# Patient Record
Sex: Male | Born: 1960 | Race: White | Hispanic: No | Smoking: Former smoker
Health system: Southern US, Community
[De-identification: ages and names within clinical notes are randomized; demographics above are authoritative.]

## PROBLEM LIST (undated history)

## (undated) DIAGNOSIS — J449 Chronic obstructive pulmonary disease, unspecified: Secondary | ICD-10-CM

## (undated) DIAGNOSIS — I10 Essential (primary) hypertension: Secondary | ICD-10-CM

## (undated) DIAGNOSIS — T7840XA Allergy, unspecified, initial encounter: Secondary | ICD-10-CM

## (undated) DIAGNOSIS — Z972 Presence of dental prosthetic device (complete) (partial): Secondary | ICD-10-CM

## (undated) DIAGNOSIS — F17290 Nicotine dependence, other tobacco product, uncomplicated: Secondary | ICD-10-CM

## (undated) DIAGNOSIS — M1990 Unspecified osteoarthritis, unspecified site: Secondary | ICD-10-CM

## (undated) DIAGNOSIS — G4733 Obstructive sleep apnea (adult) (pediatric): Secondary | ICD-10-CM

## (undated) HISTORY — DX: Presence of dental prosthetic device (complete) (partial): Z97.2

## (undated) HISTORY — DX: Obstructive sleep apnea (adult) (pediatric): G47.33

## (undated) HISTORY — DX: Unspecified osteoarthritis, unspecified site: M19.90

## (undated) HISTORY — DX: Nicotine dependence, other tobacco product, uncomplicated: F17.290

## (undated) HISTORY — DX: Allergy, unspecified, initial encounter: T78.40XA

## (undated) HISTORY — PX: DENTAL SURGERY: SHX609

---

## 2002-05-05 DIAGNOSIS — G4733 Obstructive sleep apnea (adult) (pediatric): Secondary | ICD-10-CM

## 2002-05-05 HISTORY — DX: Obstructive sleep apnea (adult) (pediatric): G47.33

## 2010-04-25 LAB — COMPREHENSIVE METABOLIC PANEL
ALT: 23 U/L (ref 10–40)
CO2: 27 mmol/L
Calcium: 9 mg/dL
Chloride: 100 mmol/L
Creat: 0.91

## 2010-12-16 LAB — URINALYSIS
Bilirubin, UA: NEGATIVE
Blood, UA: NEGATIVE
Glucose: NEGATIVE
Ketones, UA: NEGATIVE
Leukocytes, UA: NEGATIVE
pH: 6.5

## 2010-12-16 LAB — COMPREHENSIVE METABOLIC PANEL
ALT: 20 U/L (ref 10–40)
AST: 18 U/L
Alkaline Phosphatase: 56 U/L
Creat: 0.78
Total Bilirubin: 0.5 mg/dL

## 2011-08-08 ENCOUNTER — Encounter (HOSPITAL_COMMUNITY): Payer: Self-pay | Admitting: *Deleted

## 2011-08-08 ENCOUNTER — Emergency Department (HOSPITAL_COMMUNITY)
Admission: EM | Admit: 2011-08-08 | Discharge: 2011-08-08 | Disposition: A | Discharge status: Home / Self Care | Payer: Self-pay | Attending: Emergency Medicine | Admitting: Emergency Medicine

## 2011-08-08 DIAGNOSIS — L0291 Cutaneous abscess, unspecified: Secondary | ICD-10-CM

## 2011-08-08 DIAGNOSIS — IMO0002 Reserved for concepts with insufficient information to code with codable children: Secondary | ICD-10-CM | POA: Insufficient documentation

## 2011-08-08 DIAGNOSIS — I10 Essential (primary) hypertension: Secondary | ICD-10-CM | POA: Insufficient documentation

## 2011-08-08 DIAGNOSIS — L02818 Cutaneous abscess of other sites: Secondary | ICD-10-CM | POA: Insufficient documentation

## 2011-08-08 HISTORY — DX: Essential (primary) hypertension: I10

## 2011-08-08 LAB — POCT I-STAT, CHEM 8
BUN: 12 mg/dL (ref 6–23)
Creatinine, Ser: 0.9 mg/dL (ref 0.50–1.35)
Hemoglobin: 15.6 g/dL (ref 13.0–17.0)
Potassium: 3.8 mEq/L (ref 3.5–5.1)
Sodium: 138 mEq/L (ref 135–145)

## 2011-08-08 LAB — GLUCOSE, CAPILLARY: Glucose-Capillary: 113 mg/dL — ABNORMAL HIGH (ref 70–99)

## 2011-08-08 MED ORDER — SULFAMETHOXAZOLE-TRIMETHOPRIM 800-160 MG PO TABS: 1.0000 | ORAL_TABLET | Freq: Two times a day (BID) | ORAL | Status: AC

## 2011-08-08 NOTE — Discharge Instructions (Signed)

## 2011-08-08 NOTE — ED Notes (Signed)
I&D kit at bedside. 

## 2011-08-08 NOTE — ED Provider Notes (Signed)
History     CSN: [id number]  Arrival date & time 08/08/11  1633   First MD Initiated Contact with Patient 08/08/11 1905      Chief Complaint  Patient presents with  . Abscess    (Consider location/radiation/quality/duration/timing/severity/associated sxs/prior treatment) Patient is a 51 y.o. male presenting with abscess. The history is provided by the patient.  Abscess  This is a new problem. Pertinent negatives include no fever. There were no sick contacts.   the patient reports he has no history of abscesses to the skin. Last week, he noticed an abscess to his left forearm that became red and swollen. He opened it to drain and it has improved. Several days later, he noticed a similar lesion more distally on his left forearm with a similar course. He also notes that in the last 3 days, he has developed a third lesion to his occiput that he has not drained. He denies any fever or chills. There is minimal associated pain.   Past Medical History  Diagnosis Date  . Hypertension     History reviewed. No pertinent past surgical history.  History reviewed. No pertinent family history.  History  Substance Use Topics  . Smoking status: Not on file  . Smokeless tobacco: Not on file  . Alcohol Use:       Review of Systems  Constitutional: Negative for fever and chills.  HENT: Negative for neck pain and neck stiffness.   Skin: Positive for wound.  Neurological: Negative for weakness, numbness and headaches.    Allergies  Review of patient's allergies indicates no known allergies.  Home Medications   Current Outpatient Rx  Name Route Sig Dispense Refill  . ALPRAZOLAM 0.5 MG PO TABS Oral Take 0.5 mg by mouth at bedtime as needed. For sleep    . AMLODIPINE BESYLATE 10 MG PO TABS Oral Take 10 mg by mouth daily.    . ASPIRIN 325 MG PO TABS Oral Take 650 mg by mouth every 6 (six) hours as needed. For pain    . HYDROCHLOROTHIAZIDE 25 MG PO TABS Oral Take 25 mg by mouth daily.      . NEOMYCIN-POLYMYXIN-PRAMOXINE 1 % EX CREA Topical Apply 1 application topically 2 (two) times daily. To elbow    . PHENYLEPHRINE-DM-GG 2.5-5-100 MG/5ML PO LIQD Oral Take 15 mLs by mouth every 6 (six) hours as needed. For chest congestion      BP 133/85  Pulse 71  Temp(Src) 97.9 F (36.6 C) (Oral)  Resp 20  SpO2 100%  Physical Exam  Nursing note and vitals reviewed. Constitutional: He is oriented to person, place, and time. He appears well-developed and well-nourished. No distress.  HENT:  Head: Normocephalic and atraumatic.    Eyes: Conjunctivae are normal. Pupils are equal, round, and reactive to light.  Neck: Normal range of motion. Neck supple.  Cardiovascular: Normal rate and regular rhythm.   Pulmonary/Chest: Effort normal. No respiratory distress.  Abdominal: Soft. He exhibits no distension. There is no tenderness.  Musculoskeletal: He exhibits no edema and no tenderness.  Neurological: He is alert and oriented to person, place, and time. No cranial nerve deficit.  Skin: Skin is warm and dry.       2 healing abscesses to the posterior left forearm. More proximal location with a 2 mm central scab with no surrounding erythema or induration, though there is some peeling of the skin. More distal abscess with 5 mm scab to its center with small amount of surrounding erythema but  no induration. No area of fluctuance to either area. Please see HENT exam for documentation of the third abscess.    ED Course  Procedures (including critical care time)  Labs Reviewed  GLUCOSE, CAPILLARY - Abnormal; Notable for the following:    Glucose-Capillary 113 (*)    All other components within normal limits  POCT I-STAT, CHEM 8 - Abnormal; Notable for the following:    Glucose, Bld 107 (*)    All other components within normal limits   No results found. INCISION AND DRAINAGE Performed by: Lorenz Coaster Consent: Verbal consent obtained. Risks and benefits: risks, benefits and  alternatives were discussed Type: abscess  Body area: occiput  Anesthesia: local infiltration  Local anesthetic: lidocaine 2% with epinephrine  Anesthetic total: 1 ml  Complexity: complex Blunt dissection to break up loculations  Drainage: purulent  Drainage amount: small  Packing material: No packing placed due to small size of abscess  Patient tolerance: Patient tolerated the procedure well with no immediate complications.     Dx 1: Abscess of multiple sites   MDM  Abscess to scalp drained with small amount of purulent drainage. Remaining 2 abscess is already drained and are not consistent in appearance with requiring additional drainage at this time. Given multiple abscesses in the patient with no history, he will be treated with antibiotics. Although pt reports a history of "kidney problem" approx 7 years ago secondary to untreated HTN, he has been taking his HTN medications as directed and renal function today is well within normal limits. We will start him on bactrim and he is to see his primary doctor at a previously scheduled visit on Monday morning for a recheck and further recommendations.        Shaaron Adler, PA-C 08/08/11 2054  Shaaron Adler, PA-C 08/08/11 2055

## 2011-08-08 NOTE — ED Notes (Signed)
Pt in c/o multiple abscesses to left arm since last week, three bumps at this time

## 2011-08-09 NOTE — ED Provider Notes (Signed)
Medical screening examination/treatment/procedure(s) were performed by non-physician practitioner and as supervising physician I was immediately available for consultation/collaboration.  Eriel Doyon, MD 08/09/11 0006 

## 2011-12-16 LAB — CBC WITH DIFFERENTIAL/PLATELET
Basophils Absolute: 0 /uL
Basophils Relative: 1 % (ref 0–3)
Eosinophils Absolute: 0 /uL
Eosinophils Relative: 2 % (ref 0–6)
HCT: 45 %
MCHC: 34.5
MCV: 89.9 fL (ref 80–98)
Monocytes(Absolute): 0.5
RDW: 13.5
platelet count: 200

## 2011-12-25 LAB — COMPREHENSIVE METABOLIC PANEL
ALT: 25 U/L (ref 10–40)
Albumin: 4.5
CO2: 32 mmol/L
Glucose: 85
Potassium: 4.7 mmol/L
Sodium: 138 mmol/L (ref 137–147)
Total Bilirubin: 0.5 mg/dL
Total Protein: 7.7 g/dL

## 2011-12-25 LAB — CBC WITH DIFFERENTIAL/PLATELET
Basophils Absolute: 0 /uL
Eosinophil percent: 1
Grans (Absolute): 7.7
Granulocyte percent: 70 %G (ref 37–80)
LYMPH%: 22 %
Lymphs(Absolute): 2.4
MCV: 88.8 fL (ref 80–98)
RBC: 4.99
RDW: 13.7
WBC: 8
platelet count: 204

## 2011-12-25 LAB — URINALYSIS
Blood, UA: NEGATIVE
Glucose: NEGATIVE
Urobilinogen, UA: NORMAL

## 2013-02-11 ENCOUNTER — Encounter: Payer: Self-pay | Admitting: Nurse Practitioner

## 2013-02-11 ENCOUNTER — Ambulatory Visit (INDEPENDENT_AMBULATORY_CARE_PROVIDER_SITE_OTHER): Payer: 59 | Admitting: Nurse Practitioner

## 2013-02-11 VITALS — BP 150/98 | HR 69 | Temp 97.7°F | Ht 70.5 in | Wt 218.0 lb

## 2013-02-11 DIAGNOSIS — Z87898 Personal history of other specified conditions: Secondary | ICD-10-CM

## 2013-02-11 DIAGNOSIS — Z0000 Encounter for general adult medical examination without abnormal findings: Secondary | ICD-10-CM

## 2013-02-11 DIAGNOSIS — Z23 Encounter for immunization: Secondary | ICD-10-CM

## 2013-02-11 DIAGNOSIS — F1021 Alcohol dependence, in remission: Secondary | ICD-10-CM

## 2013-02-11 DIAGNOSIS — Z716 Tobacco abuse counseling: Secondary | ICD-10-CM | POA: Insufficient documentation

## 2013-02-11 DIAGNOSIS — G4700 Insomnia, unspecified: Secondary | ICD-10-CM

## 2013-02-11 DIAGNOSIS — F17200 Nicotine dependence, unspecified, uncomplicated: Secondary | ICD-10-CM

## 2013-02-11 DIAGNOSIS — I10 Essential (primary) hypertension: Secondary | ICD-10-CM | POA: Insufficient documentation

## 2013-02-11 LAB — TSH: TSH: 1.1 u[IU]/mL (ref 0.35–5.50)

## 2013-02-11 LAB — LIPID PANEL
HDL: 41.7 mg/dL (ref >39.00)
Triglycerides: 152 mg/dL — ABNORMAL HIGH (ref 0.0–149.0)
VLDL: 30.4 mg/dL (ref 0.0–40.0)

## 2013-02-11 LAB — HEPATIC FUNCTION PANEL
ALT: 23 U/L (ref 0–53)
Alkaline Phosphatase: 58 U/L (ref 39–117)
Bilirubin, Direct: 0.2 mg/dL (ref 0.0–0.3)
Total Bilirubin: 1 mg/dL (ref 0.3–1.2)
Total Protein: 7.1 g/dL (ref 6.0–8.3)

## 2013-02-11 LAB — RENAL FUNCTION PANEL
CO2: 30 mEq/L (ref 19–32)
Calcium: 9.1 mg/dL (ref 8.4–10.5)
Chloride: 99 mEq/L (ref 96–112)
Creatinine, Ser: 1 mg/dL (ref 0.4–1.5)
GFR: 89.18 mL/min (ref >60.00)
Sodium: 137 mEq/L (ref 135–145)

## 2013-02-11 LAB — CBC
Hemoglobin: 14.7 g/dL (ref 13.0–17.0)
MCHC: 34.8 g/dL (ref 30.0–36.0)
RDW: 13.6 % (ref 11.5–14.6)
WBC: 7.4 10*3/uL (ref 4.5–10.5)

## 2013-02-11 MED ORDER — LORAZEPAM 0.5 MG PO TABS: 0.5000 mg | ORAL_TABLET | Freq: Two times a day (BID) | ORAL | Status: DC | PRN

## 2013-02-11 MED ORDER — ALBUTEROL SULFATE HFA 108 (90 BASE) MCG/ACT IN AERS: 2.0000 | INHALATION_SPRAY | Freq: Four times a day (QID) | RESPIRATORY_TRACT | Status: DC | PRN

## 2013-02-11 MED ORDER — LISINOPRIL-HYDROCHLOROTHIAZIDE 10-12.5 MG PO TABS: 1.0000 | ORAL_TABLET | Freq: Every day | ORAL | Status: DC

## 2013-02-11 NOTE — Progress Notes (Signed)
Subjective:     Todd Mercado is a 52 y.o. male and is here to establish care. He has a history of treated HTN, but ran out of meds 1 mo. Ago; smoker, and excessive alcohol consumption for which he is motivated to stop. He had a historical PCP. The patient reports L foot numbness associated with prolonged sitting and occasional R arm numbness.Marland Kitchen  History   Social History  . Marital Status: Single    Spouse Name: N/A    Number of Children: 3  . Years of Education: HS   Occupational History  .      sign service tech   Social History Main Topics  . Smoking status: Heavy Tobacco Smoker -- 1.50 packs/day for 30 years    Types: Cigarettes  . Smokeless tobacco: Not on file     Comment: discussed benefits of quitting  . Alcohol Use: Yes     Comment: 6-12 beers daily  . Drug Use: Not on file  . Sexual Activity: Not on file   Other Topics Concern  . Not on file   Social History Narrative   Todd Mercado is divorced and is currently living with his mother. He has 3 children.   No health maintenance topics applied.  The following portions of the patient's history were reviewed and updated as appropriate: allergies, current medications, past family history, past medical history, past social history, past surgical history and problem list.  Review of Systems Constitutional: negative for anorexia, chills, fatigue, fevers, night sweats and weight loss Eyes: positive for contacts/glasses and last eye exam March, 2014 Ears, nose, mouth, throat, and face: negative Respiratory: positive for cough, sputum, wheezing and smoker Cardiovascular: negative for chest pain, chest pressure/discomfort, irregular heart beat and lower extremity edema Gastrointestinal: negative for abdominal pain, change in bowel habits, constipation, diarrhea, dyspepsia and nausea Genitourinary:negative for decreased stream Integument/breast: negative for rash Musculoskeletal:positive for L foot numbness when  sitting Neurological: positive for paresthesia, negative for coordination problems, gait problems, headaches, memory problems, seizures, tremors and weakness Behavioral/Psych: positive for excessive alcohol consumption, sleep disturbance, tobacco use and Hx os depression/anxiety when father died, treated w/wellbutrin-did not like SE., negative for anxiety, depression, fatigue and illegal drug usage   Objective:    BP 150/98  Pulse 69  Temp(Src) 97.7 F (36.5 C) (Oral)  Ht 5' 10.5" (1.791 m)  Wt 218 lb (98.884 kg)  BMI 30.83 kg/m2  SpO2 95% General appearance: alert, cooperative, appears stated age and no distress Head: Normocephalic, without obvious abnormality, atraumatic Eyes: negative findings: lids and lashes normal, conjunctivae and sclerae normal, corneas clear and pupils equal, round, reactive to light and accomodation Ears: normal TM's and external ear canals both ears Throat: posterior pharynx erythematous, uvula swollen. no c/o sore throat. Pt is smoker. has frequent cough. Neck: no adenopathy, no carotid bruit, supple, symmetrical, trachea midline and thyroid not enlarged, symmetric, no tenderness/mass/nodules Lungs: diminished breath sounds bibasilar, wheezes RUL and productive cough. some wheezes clear w/coughing Heart: regular rate and rhythm, S1, S2 normal, no murmur, click, rub or gallop Abdomen: soft, non-tender; bowel sounds normal; no masses,  no organomegaly and round, mildly protruding Extremities: extremities normal, atraumatic, no cyanosis or edema Pulses: 2+ and symmetric Lymph nodes: Cervical, supraclavicular, and axillary nodes normal. Neurologic: Alert and oriented X 3, normal strength and tone. Normal symmetric reflexes. Normal coordination and gait    Assessment:    Healthy male exam. Prev care: needs flu, pneumococcal, and Tdap, ifob. Opted not to check PSA.  Screening labs as ordered. HTN: historical Dx treated w/amlodipine & HCTZ, ran out of meds 1 mo.  Ago, elevated in ofc today. Smoker, motivated to quit, in contemplative stage. Productive cough, wheezing, likely COPD Excessive alcohol consumption: pt reports having recently quit drinking in last 2 weeks. He has experienced irritability, no other withdrawal symptoms L foot numbness assoc w/prolonged sitting, likely lumbar radiculopathy     Plan:    1. Flu & Pna vaccine administered today. Will get tdap at next visit. 2 Restart lisinopril& HCTZ at 10 mg/12.5mg , check renal func, f/u in 3-4 weeks 3 educated regarding benefits of cessation, encouraged use of nicotene gum or e-cigg. Albuterol inhaler, f/u 3-4 weeks. Offered LDCT chest for lung ca screening. Pt declined. 4 PRN ativan 5 back stretches, pt thinks it is his shoe, planning to get new work boots this weekend. Will order back xrays ot send to PT if no improvement.   See After Visit Summary for Counseling Recommendations

## 2013-02-11 NOTE — Patient Instructions (Signed)
Our office will call you with lab results. Nice to meet you.  Preventive Care for Adults, Male A healthy lifestyle and preventive care can promote health and wellness. Preventive health guidelines for men include the following key practices:  A routine yearly physical is a good way to check with your caregiver about your health and preventative screening. It is a chance to share any concerns and updates on your health, and to receive a thorough exam.  Visit your dentist for a routine exam and preventative care every 6 months. Brush your teeth twice a day and floss once a day. Good oral hygiene prevents tooth decay and gum disease.  The frequency of eye exams is based on your age, health, family medical history, use of contact lenses, and other factors. Follow your caregiver's recommendations for frequency of eye exams.  Eat a healthy diet. Foods like vegetables, fruits, whole grains, low-fat dairy products, and lean protein foods contain the nutrients you need without too many calories. Decrease your intake of foods high in solid fats, added sugars, and salt. Eat the right amount of calories for you.Get information about a proper diet from your caregiver, if necessary.  Regular physical exercise is one of the most important things you can do for your health. Most adults should get at least 150 minutes of moderate-intensity exercise (any activity that increases your heart rate and causes you to sweat) each week. In addition, most adults need muscle-strengthening exercises on 2 or more days a week.  Maintain a healthy weight. The body mass index (BMI) is a screening tool to identify possible weight problems. It provides an estimate of body fat based on height and weight. Your caregiver can help determine your BMI, and can help you achieve or maintain a healthy weight.For adults 20 years and older:  A BMI below 18.5 is considered underweight.  A BMI of 18.5 to 24.9 is normal.  A BMI of 25 to 29.9  is considered overweight.  A BMI of 30 and above is considered obese.  Maintain normal blood lipids and cholesterol levels by exercising and minimizing your intake of saturated fat. Eat a balanced diet with plenty of fruit and vegetables. Blood tests for lipids and cholesterol should begin at age 88 and be repeated every 5 years. If your lipid or cholesterol levels are high, you are over 50, or you are a high risk for heart disease, you may need your cholesterol levels checked more frequently.Ongoing high lipid and cholesterol levels should be treated with medicines if diet and exercise are not effective.  If you smoke, find out from your caregiver how to quit. If you do not use tobacco, do not start.  If you choose to drink alcohol, do not exceed 2 drinks per day. One drink is considered to be 12 ounces (355 mL) of beer, 5 ounces (148 mL) of wine, or 1.5 ounces (44 mL) of liquor.  Avoid use of street drugs. Do not share needles with anyone. Ask for help if you need support or instructions about stopping the use of drugs.  High blood pressure causes heart disease and increases the risk of stroke. Your blood pressure should be checked at least every 1 to 2 years. Ongoing high blood pressure should be treated with medicines, if weight loss and exercise are not effective.  If you are 32 to 50 years old, ask your caregiver if you should take aspirin to prevent heart disease.  Diabetes screening involves taking a blood sample to check  your fasting blood sugar level. This should be done once every 3 years, after age 75, if you are within normal weight and without risk factors for diabetes. Testing should be considered at a younger age or be carried out more frequently if you are overweight and have at least 1 risk factor for diabetes.  Colorectal cancer can be detected and often prevented. Most routine colorectal cancer screening begins at the age of 81 and continues through age 13. However, your  caregiver may recommend screening at an earlier age if you have risk factors for colon cancer. On a yearly basis, your caregiver may provide home test kits to check for hidden blood in the stool. Use of a small camera at the end of a tube, to directly examine the colon (sigmoidoscopy or colonoscopy), can detect the earliest forms of colorectal cancer. Talk to your caregiver about this at age 59, when routine screening begins. Direct examination of the colon should be repeated every 5 to 10 years through age 50, unless early forms of pre-cancerous polyps or small growths are found.  Hepatitis C blood testing is recommended for all people born from [birth year] through 1965 and any individual with known risks for hepatitis C.  Practice safe sex. Use condoms and avoid high-risk sexual practices to reduce the spread of sexually transmitted infections (STIs). STIs include gonorrhea, chlamydia, syphilis, trichomonas, herpes, HPV, and human immunodeficiency virus (HIV). Herpes, HIV, and HPV are viral illnesses that have no cure. They can result in disability, cancer, and death.  A one-time screening for abdominal aortic aneurysm (AAA) and surgical repair of large AAAs by sound wave imaging (ultrasonography) is recommended for ages 36 to 17 years who are current or former smokers.  Healthy men should no longer receive prostate-specific antigen (PSA) blood tests as part of routine cancer screening. Consult with your caregiver about prostate cancer screening.  Testicular cancer screening is not recommended for adult males who have no symptoms. Screening includes self-exam, caregiver exam, and other screening tests. Consult with your caregiver about any symptoms you have or any concerns you have about testicular cancer.  Use sunscreen with skin protection factor (SPF) of 30 or more. Apply sunscreen liberally and repeatedly throughout the day. You should seek shade when your shadow is shorter than you. Protect yourself by  wearing long sleeves, pants, a wide-brimmed hat, and sunglasses year round, whenever you are outdoors.  Once a month, do a whole body skin exam, using a mirror to look at the skin on your back. Notify your caregiver of new moles, moles that have irregular borders, moles that are larger than a pencil eraser, or moles that have changed in shape or color.  Stay current with required immunizations.  Influenza. You need a dose every fall (or winter). The composition of the flu vaccine changes each year, so being vaccinated once is not enough.  Pneumococcal polysaccharide. You need 1 to 2 doses if you smoke cigarettes or if you have certain chronic medical conditions. You need 1 dose at age 25 (or older) if you have never been vaccinated.  Tetanus, diphtheria, pertussis (Tdap, Td). Get 1 dose of Tdap vaccine if you are younger than age 75 years, are over 58 and have contact with an infant, are a Research scientist (physical sciences), or simply want to be protected from whooping cough. After that, you need a Td booster dose every 10 years. Consult your caregiver if you have not had at least 3 tetanus and diphtheria-containing shots sometime in your life  or have a deep or dirty wound.  HPV. This vaccine is recommended for males 13 through 50 years of age. This vaccine may be given to men 22 through 49 years of age who have not completed the 3 dose series. It is recommended for men through age 40 who have sex with men or whose immune system is weakened because of HIV infection, other illness, or medications. The vaccine is given in 3 doses over 6 months.  Measles, mumps, rubella (MMR). You need at least 1 dose of MMR if you were born in 1957 or later. You may also need a 2nd dose.  Meningococcal. If you are age 25 to 65 years and a Orthoptist living in a residence hall, or have one of several medical conditions, you need to get vaccinated against meningococcal disease. You may also need additional booster  doses.  Zoster (shingles). If you are age 15 years or older, you should get this vaccine.  Varicella (chickenpox). If you have never had chickenpox or you were vaccinated but received only 1 dose, talk to your caregiver to find out if you need this vaccine.  Hepatitis A. You need this vaccine if you have a specific risk factor for hepatitis A virus infection, or you simply wish to be protected from this disease. The vaccine is usually given as 2 doses, 6 to 18 months apart.  Hepatitis B. You need this vaccine if you have a specific risk factor for hepatitis B virus infection or you simply wish to be protected from this disease. The vaccine is given in 3 doses, usually over 6 months. Preventative Service / Frequency Ages 68 to 55  Blood pressure check.** / Every 1 to 2 years.  Lipid and cholesterol check.** / Every 5 years beginning at age 102.  Hepatitis C blood test.** / For any individual with known risks for hepatitis C.  Skin self-exam. / Monthly.  Influenza immunization.** / Every year.  Pneumococcal polysaccharide immunization.** / 1 to 2 doses if you smoke cigarettes or if you have certain chronic medical conditions.  Tetanus, diphtheria, pertussis (Tdap,Td) immunization. / A one-time dose of Tdap vaccine. After that, you need a Td booster dose every 10 years.  HPV immunization. / 3 doses over 6 months, if 26 and younger.  Measles, mumps, rubella (MMR) immunization. / You need at least 1 dose of MMR if you were born in 1957 or later. You may also need a 2nd dose.  Meningococcal immunization. / 1 dose if you are age 23 to 85 years and a Orthoptist living in a residence hall, or have one of several medical conditions, you need to get vaccinated against meningococcal disease. You may also need additional booster doses.  Varicella immunization.** / Consult your caregiver.  Hepatitis A immunization.** / Consult your caregiver. 2 doses, 6 to 18 months  apart.  Hepatitis B immunization.** / Consult your caregiver. 3 doses usually over 6 months. Ages 32 to 29  Blood pressure check.** / Every 1 to 2 years.  Lipid and cholesterol check.** / Every 5 years beginning at age 65.  Fecal occult blood test (FOBT) of stool. / Every year beginning at age 83 and continuing until age 38. You may not have to do this test if you get colonoscopy every 10 years.  Flexible sigmoidoscopy** or colonoscopy.** / Every 5 years for a flexible sigmoidoscopy or every 10 years for a colonoscopy beginning at age 37 and continuing until age 10.  Hepatitis C  blood test.** / For all people born from [birth year] through 1965 and any individual with known risks for hepatitis C.  Skin self-exam. / Monthly.  Influenza immunization.** / Every year.  Pneumococcal polysaccharide immunization.** / 1 to 2 doses if you smoke cigarettes or if you have certain chronic medical conditions.  Tetanus, diphtheria, pertussis (Tdap/Td) immunization.** / A one-time dose of Tdap vaccine. After that, you need a Td booster dose every 10 years.  Measles, mumps, rubella (MMR) immunization. / You need at least 1 dose of MMR if you were born in 1957 or later. You may also need a 2nd dose.  Varicella immunization.**/ Consult your caregiver.  Meningococcal immunization.** / Consult your caregiver.  Hepatitis A immunization.** / Consult your caregiver. 2 doses, 6 to 18 months apart.  Hepatitis B immunization.** / Consult your caregiver. 3 doses, usually over 6 months. Ages 43 and over  Blood pressure check.** / Every 1 to 2 years.  Lipid and cholesterol check.**/ Every 5 years beginning at age 15.  Fecal occult blood test (FOBT) of stool. / Every year beginning at age 32 and continuing until age 81. You may not have to do this test if you get colonoscopy every 10 years.  Flexible sigmoidoscopy** or colonoscopy.** / Every 5 years for a flexible sigmoidoscopy or every 10 years for a colonoscopy  beginning at age 64 and continuing until age 71.  Hepatitis C blood test.** / For all people born from [birth year] through 1963 and any individual with known risks for hepatitis C.  Abdominal aortic aneurysm (AAA) screening.** / A one-time screening for ages 58 to 26 years who are current or former smokers.  Skin self-exam. / Monthly.  Influenza immunization.** / Every year.  Pneumococcal polysaccharide immunization.** / 1 dose at age 28 (or older) if you have never been vaccinated.  Tetanus, diphtheria, pertussis (Tdap, Td) immunization. / A one-time dose of Tdap vaccine if you are over 65 and have contact with an infant, are a Research scientist (physical sciences), or simply want to be protected from whooping cough. After that, you need a Td booster dose every 10 years.  Varicella immunization. ** / Consult your caregiver.  Meningococcal immunization.** / Consult your caregiver.  Hepatitis A immunization. ** / Consult your caregiver. 2 doses, 6 to 18 months apart.  Hepatitis B immunization.** / Check with your caregiver. 3 doses, usually over 6 months. **Family history and personal history of risk and conditions may change your caregiver's recommendations. Document Released: 06/17/2001 Document Revised: 07/14/2011 Document Reviewed: 09/16/2010 [facility] Patient Information 2014 [place], [place].

## 2013-02-12 LAB — HIV ANTIBODY (ROUTINE TESTING W REFLEX): HIV: NONREACTIVE

## 2013-02-12 LAB — URINALYSIS, MICROSCOPIC ONLY
Bacteria, UA: NONE SEEN
Casts: NONE SEEN

## 2013-02-12 LAB — VITAMIN D 25 HYDROXY (VIT D DEFICIENCY, FRACTURES): Vit D, 25-Hydroxy: 36 ng/mL (ref 30–89)

## 2013-02-14 ENCOUNTER — Encounter: Payer: Self-pay | Admitting: Nurse Practitioner

## 2013-02-14 ENCOUNTER — Telehealth: Payer: Self-pay | Admitting: Nurse Practitioner

## 2013-02-14 NOTE — Telephone Encounter (Signed)
Patient's mom was notified of results. Mom stated that patient will call back for f/u appt.

## 2013-02-14 NOTE — Telephone Encounter (Signed)
Kidney & liver func nml, no thyroid disease, no diabetes. HIV non-reactv.  Still waiting on B12 (methylmalonic acid) Vit D low, rec 5000 iu capsule daily. ifob not returned yet.

## 2013-02-15 ENCOUNTER — Telehealth: Payer: Self-pay | Admitting: Nurse Practitioner

## 2013-02-15 LAB — METHYLMALONIC ACID, SERUM: Methylmalonic Acid, Quant: 0.3 umol/L (ref <0.40)

## 2013-02-15 NOTE — Telephone Encounter (Signed)
Methylmalonic acid is nml-no B12 deficiency.

## 2013-02-15 NOTE — Telephone Encounter (Signed)
LMOVM for patient to return call concerning lab results.  

## 2013-02-21 ENCOUNTER — Other Ambulatory Visit (INDEPENDENT_AMBULATORY_CARE_PROVIDER_SITE_OTHER): Payer: 59

## 2013-02-21 DIAGNOSIS — Z0000 Encounter for general adult medical examination without abnormal findings: Secondary | ICD-10-CM

## 2013-02-22 ENCOUNTER — Telehealth: Payer: Self-pay | Admitting: Nurse Practitioner

## 2013-02-22 LAB — FECAL OCCULT BLOOD, IMMUNOCHEMICAL: Fecal Occult Bld: NEGATIVE

## 2013-02-22 NOTE — Telephone Encounter (Signed)
Fecal occult blood test neg. F/u 1 yr.

## 2013-02-23 NOTE — Telephone Encounter (Signed)
Left detailed message on patient's home vm. Per DPR. 

## 2013-02-23 NOTE — Telephone Encounter (Signed)
Left detailed message on patient's home vm. Per DPR.

## 2013-03-11 ENCOUNTER — Encounter: Payer: Self-pay | Admitting: Nurse Practitioner

## 2013-03-11 ENCOUNTER — Ambulatory Visit (INDEPENDENT_AMBULATORY_CARE_PROVIDER_SITE_OTHER): Payer: 59 | Admitting: Nurse Practitioner

## 2013-03-11 VITALS — BP 148/90 | Wt 214.8 lb

## 2013-03-11 DIAGNOSIS — I10 Essential (primary) hypertension: Secondary | ICD-10-CM

## 2013-03-11 DIAGNOSIS — F1011 Alcohol abuse, in remission: Secondary | ICD-10-CM

## 2013-03-11 DIAGNOSIS — F17200 Nicotine dependence, unspecified, uncomplicated: Secondary | ICD-10-CM

## 2013-03-11 HISTORY — DX: Alcohol abuse, in remission: F10.11

## 2013-03-11 MED ORDER — ALBUTEROL SULFATE HFA 108 (90 BASE) MCG/ACT IN AERS: 2.0000 | INHALATION_SPRAY | Freq: Four times a day (QID) | RESPIRATORY_TRACT | Status: DC | PRN

## 2013-03-11 MED ORDER — LISINOPRIL-HYDROCHLOROTHIAZIDE 10-12.5 MG PO TABS: 1.0000 | ORAL_TABLET | Freq: Every day | ORAL | Status: DC

## 2013-03-11 NOTE — Progress Notes (Signed)
Subjective:    Patient here for follow-up of elevated blood pressure.  He is exercising and is not adherent to a low-salt diet. Cardiac symptoms: none. Patient denies: chest pain, chest pressure/discomfort, dyspnea, irregular heart beat and lower extremity edema. Cardiovascular risk factors: hypertension, male gender and smoking/ tobacco exposure. Use of agents associated with hypertension: decongestants. History of target organ damage: none. He has cut out soda & has increased exercise-lost 4 pounds.  He mentions a syncopal episode that occurred 3 weeks ago, 5 d after starting bp med, while at work. Event was associated with smashing his finger, standing, & passing out for 2 seconds 9"long enough to fall & get back up"). Episode was witnessed by co-workers. Pt was examined by RN-pt states vitals were nml. Pt states it is not the first time he has passed out due to seeing blood. He denies nausea, diaphoresis, palpitations, or SOB associated with event.  Pt c/o nocturia-gets up 3 times every night.  Common ambulatory SmartLinks:[id number]}  Review of Systems Constitutional: positive for intentional weight loss Ears, nose, mouth, throat, and face: positive for nasal congestion Respiratory: positive for cough, sputum and smoker, feels better using albuterol twice daily. Cardiovascular: negative for chest pain, chest pressure/discomfort, fatigue, irregular heart beat and lower extremity edema Genitourinary:positive for nocturia Musculoskeletal:positive for fatty lump that "pops up" R shoulder , less foot & leg pain after changing boots. Has back pain w/prolonged sitting. Neurological: transient foot & hand numbness Behavioral/Psych: positive for excessive alcohol consumption, negative for illegal drug usage and sleep disturbance     Objective:    BP 148/90  Wt 214 lb 12.8 oz (97.433 kg) General appearance: alert, cooperative, appears older than stated age and no distress Head: Normocephalic, without  obvious abnormality, atraumatic Eyes: negative findings: lids and lashes normal, conjunctivae and sclerae normal and corneas clear Ears: bilateral ear effusions Throat: abnormal findings: posterior pharynx erythema Neck: no adenopathy, supple, symmetrical, trachea midline and thyroid not enlarged, symmetric, no tenderness/mass/nodules Lungs: wheezes LUL Heart: regular rate and rhythm, S1, S2 normal, no murmur, click, rub or gallop and distant heart tones    Assessment:    Hypertension, stage 1 taking lisinopril/Hctz 10/12.5. Lost 4 pounds, taking OTC cold meds today. Evidence of target organ damage: none.   Syncopal episode, per pt hx, seems to be r/t injuring finger & seeing blood. Denies cardiac symptoms. Smoker, not motivated to quit. Using albuterol inhaler bid. Viral URI-nasal congestion, cough no worse than usual, no increase in sputum.  Plan:  Low salt diet, continue exercise & cut out sugar. Continue meds at current dose. F/u 6 mos. Offered ECG, pt refused . Discussed lower risk for heart dz if he stops smoking: 10 yr risk for ASCVD is 8%, if stops smoking- 3.65%. Offered statin. Pt refused Daily sinus rinses.

## 2013-03-11 NOTE — Patient Instructions (Addendum)
Great job with weight loss! Continue to cut out sugar & keep moving: 30 minutes of exercise daily has been proven to decrease risk for heart disease, diabetes, stroke, dementia, cancer, and depression. Limit salt to 2400 mg daily to help manage blood pressure. For nasal congestion, start daily sinus rinses (Neilmed sinus Rinse). Use for 5-6 days. Take 200-600 mg ibuprophen every 6-8 hours for a few days for sinus headache.  See you in 6 months or sooner if you need [place].  Smoking Cessation Quitting smoking is important to your health and has many advantages. However, it is not always easy to quit since nicotine is a very addictive drug. Often times, people try 3 times or more before being able to quit. This document explains the best ways for you to prepare to quit smoking. Quitting takes hard work and a lot of effort, but you can do it. ADVANTAGES OF QUITTING SMOKING  You will live longer, feel better, and live better.  Your body will feel the impact of quitting smoking almost immediately.  Within 20 minutes, blood pressure decreases. Your pulse returns to its normal level.  After 8 hours, carbon monoxide levels in the blood return to normal. Your oxygen level increases.  After 24 hours, the chance of having a heart attack starts to decrease. Your breath, hair, and body stop smelling like smoke.  After 48 hours, damaged nerve endings begin to recover. Your sense of taste and smell improve.  After 72 hours, the body is virtually free of nicotine. Your bronchial tubes relax and breathing becomes easier.  After 2 to 12 weeks, lungs can hold more air. Exercise becomes easier and circulation improves.  The risk of having a heart attack, stroke, cancer, or lung disease is greatly reduced.  After 1 year, the risk of coronary heart disease is cut in half.  After 5 years, the risk of stroke falls to the same as a nonsmoker.  After 10 years, the risk of lung cancer is cut in half and the risk of  other cancers decreases significantly.  After 15 years, the risk of coronary heart disease drops, usually to the level of a nonsmoker.  If you are pregnant, quitting smoking will improve your chances of having a healthy baby.  The people you live with, especially any children, will be healthier.  You will have extra money to spend on things other than cigarettes. QUESTIONS TO THINK ABOUT BEFORE ATTEMPTING TO QUIT You may want to talk about your answers with your caregiver.  Why do you want to quit?  If you tried to quit in the past, what helped and what did not?  What will be the most difficult situations for you after you quit? How will you plan to handle them?  Who can help you through the tough times? Your family? Friends? A caregiver?  What pleasures do you get from smoking? What ways can you still get pleasure if you quit? Here are some questions to ask your caregiver:  How can you help me to be successful at quitting?  What medicine do you think would be best for me and how should I take it?  What should I do if I need more help?  What is smoking withdrawal like? How can I get information on withdrawal? GET READY  Set a quit date.  Change your environment by getting rid of all cigarettes, ashtrays, matches, and lighters in your home, car, or work. Do not let people smoke in your home.  Review your past attempts to quit. Think about what worked and what did not. GET SUPPORT AND ENCOURAGEMENT You have a better chance of being successful if you have help. You can get support in many ways.  Tell your family, friends, and co-workers that you are going to quit and need their support. Ask them not to smoke around you.  Get individual, group, or telephone counseling and support. Programs are available at [employer] and health centers. Call your local health department for information about programs in your area.  Spiritual beliefs and practices may help some smokers  quit.  Download a "quit meter" on your computer to keep track of quit statistics, such as how long you have gone without smoking, cigarettes not smoked, and money saved.  Get a self-help book about quitting smoking and staying off of tobacco. LEARN NEW SKILLS AND BEHAVIORS  Distract yourself from urges to smoke. Talk to someone, go for a walk, or occupy your time with a task.  Change your normal routine. Take a different route to work. Drink tea instead of coffee. Eat breakfast in a different place.  Reduce your stress. Take a hot bath, exercise, or read a book.  Plan something enjoyable to do every day. Reward yourself for not smoking.  Explore interactive web-based programs that specialize in helping you quit. GET MEDICINE AND USE IT CORRECTLY Medicines can help you stop smoking and decrease the urge to smoke. Combining medicine with the above behavioral methods and support can greatly increase your chances of successfully quitting smoking.  Nicotine replacement therapy helps deliver nicotine to your body without the negative effects and risks of smoking. Nicotine replacement therapy includes nicotine gum, lozenges, inhalers, nasal sprays, and skin patches. Some may be available over-the-counter and others require a prescription.  Antidepressant medicine helps people abstain from smoking, but how this works is unknown. This medicine is available by prescription.  Nicotinic receptor partial agonist medicine simulates the effect of nicotine in your brain. This medicine is available by prescription. Ask your caregiver for advice about which medicines to use and how to use them based on your health history. Your caregiver will tell you what side effects to look out for if you choose to be on a medicine or therapy. Carefully read the information on the package. Do not use any other product containing nicotine while using a nicotine replacement product.  RELAPSE OR DIFFICULT SITUATIONS Most  relapses occur within the first 3 months after quitting. Do not be discouraged if you start smoking again. Remember, most people try several times before finally quitting. You may have symptoms of withdrawal because your body is used to nicotine. You may crave cigarettes, be irritable, feel very hungry, cough often, get headaches, or have difficulty concentrating. The withdrawal symptoms are only temporary. They are strongest when you first quit, but they will go away within 10 14 days. To reduce the chances of relapse, try to:  Avoid drinking alcohol. Drinking lowers your chances of successfully quitting.  Reduce the amount of caffeine you consume. Once you quit smoking, the amount of caffeine in your body increases and can give you symptoms, such as a rapid heartbeat, sweating, and anxiety.  Avoid smokers because they can make you want to smoke.  Do not let weight gain distract you. Many smokers will gain weight when they quit, usually less than 10 pounds. Eat a healthy diet and stay active. You can always lose the weight gained after you quit.  Find ways to  improve your mood other than smoking. FOR MORE INFORMATION  www.smokefree.gov  Document Released: 04/15/2001 Document Revised: 10/21/2011 Document Reviewed: 07/31/2011 [facility] Patient Information 2014 [place], [place].

## 2013-03-23 ENCOUNTER — Encounter: Payer: Self-pay | Admitting: Family Medicine

## 2013-04-03 ENCOUNTER — Encounter: Payer: Self-pay | Admitting: Nurse Practitioner

## 2013-04-03 DIAGNOSIS — Z8669 Personal history of other diseases of the nervous system and sense organs: Secondary | ICD-10-CM | POA: Insufficient documentation

## 2013-04-03 DIAGNOSIS — M79672 Pain in left foot: Secondary | ICD-10-CM

## 2013-04-03 DIAGNOSIS — M79671 Pain in right foot: Secondary | ICD-10-CM | POA: Insufficient documentation

## 2013-04-05 ENCOUNTER — Encounter: Payer: Self-pay | Admitting: *Deleted

## 2013-04-05 ENCOUNTER — Telehealth: Payer: Self-pay | Admitting: *Deleted

## 2013-04-05 NOTE — Telephone Encounter (Signed)
Error

## 2013-04-08 ENCOUNTER — Other Ambulatory Visit: Payer: Self-pay | Admitting: *Deleted

## 2013-04-08 DIAGNOSIS — I10 Essential (primary) hypertension: Secondary | ICD-10-CM

## 2013-04-12 ENCOUNTER — Other Ambulatory Visit: Payer: Self-pay | Admitting: *Deleted

## 2013-04-12 DIAGNOSIS — I10 Essential (primary) hypertension: Secondary | ICD-10-CM

## 2013-12-30 ENCOUNTER — Ambulatory Visit (INDEPENDENT_AMBULATORY_CARE_PROVIDER_SITE_OTHER): Payer: 59 | Admitting: Nurse Practitioner

## 2013-12-30 VITALS — BP 169/93 | HR 69 | Temp 97.9°F | Resp 18 | Ht 70.5 in | Wt 212.0 lb

## 2013-12-30 DIAGNOSIS — IMO0001 Reserved for inherently not codable concepts without codable children: Secondary | ICD-10-CM

## 2013-12-30 DIAGNOSIS — R030 Elevated blood-pressure reading, without diagnosis of hypertension: Secondary | ICD-10-CM

## 2013-12-30 MED ORDER — LISINOPRIL 20 MG PO TABS: 20.0000 mg | ORAL_TABLET | Freq: Every day | ORAL | Status: DC

## 2013-12-30 NOTE — Progress Notes (Signed)
Subjective:    Patient here for follow-up of elevated blood pressure.  He is not exercising and is not adherent to a low-salt diet.  Blood pressure is not well controlled at home. Cardiac symptoms: none. Patient denies: chest pain, chest pressure/discomfort, fatigue, irregular heart beat, lower extremity edema and palpitations. Cardiovascular risk factors: hypertension, male gender, sedentary lifestyle and smoking/ tobacco exposure. Use of agents associated with hypertension: none. History of target organ damage: none.  He was last seen in ofc 9 mos ago for HTN. He changed jobs & lost insurance & quit taking medication about 3 mos ago. He reports normal home blood pressures until a few weeks ago.    The following portions of the patient's history were reviewed and updated as appropriate: allergies, current medications, past family history, past medical history, past social history, past surgical history and problem list.  Review of Systems Pertinent items are noted in HPI.     Objective:    BP 169/93  Pulse 69  Temp(Src) 97.9 F (36.6 C) (Oral)  Resp 18  Ht 5' 10.5" (1.791 m)  Wt 212 lb (96.163 kg)  BMI 29.98 kg/m2  SpO2 96% General appearance: alert, cooperative, appears stated age and no distress Head: Normocephalic, without obvious abnormality, atraumatic Eyes: negative findings: lids and lashes normal and conjunctivae and sclerae normal Lungs: clear to auscultation bilaterally Heart: regular rate and rhythm, S1, S2 normal, no murmur, click, rub or gallop Extremities: extremities normal, atraumatic, no cyanosis or edema    Assessment:  1. Elevated blood pressure - lisinopril (PRINIVIL,ZESTRIL) 20 MG tablet; Take 1 tablet (20 mg total) by mouth daily.  Dispense: 30 tablet; Refill: 1  No labs today as no ins, but advised pt need to check kidney & liver function within next few mos.  F/u in 1 mo.

## 2013-12-30 NOTE — Progress Notes (Signed)
Pre visit review using our clinic review tool, if applicable. No additional management support is needed unless otherwise documented below in the visit note. 

## 2013-12-30 NOTE — Patient Instructions (Signed)
Please start blood pressure medicine.  Take blood pressure at home. It should stay below 140/80 to avoid damage to kidneys, heart, & eyes. When you check pressure, feet should be flat on floor, cuff at level of heart, and sit for 5 minutes before pushing button.  Cut back on food portions and take a 30 minute walk every day-you'll feel better and may live longer.  Please return in 3 to 4 weeks so I can see how you are responding.  Nice to see you.

## 2014-02-27 ENCOUNTER — Other Ambulatory Visit: Payer: Self-pay | Admitting: *Deleted

## 2014-02-27 DIAGNOSIS — R030 Elevated blood-pressure reading, without diagnosis of hypertension: Principal | ICD-10-CM

## 2014-02-27 DIAGNOSIS — IMO0001 Reserved for inherently not codable concepts without codable children: Secondary | ICD-10-CM

## 2014-02-27 MED ORDER — LISINOPRIL 20 MG PO TABS: 20.0000 mg | ORAL_TABLET | Freq: Every day | ORAL | Status: DC

## 2014-05-01 ENCOUNTER — Other Ambulatory Visit: Payer: Self-pay | Admitting: *Deleted

## 2014-05-01 DIAGNOSIS — IMO0001 Reserved for inherently not codable concepts without codable children: Secondary | ICD-10-CM

## 2014-05-01 DIAGNOSIS — R030 Elevated blood-pressure reading, without diagnosis of hypertension: Principal | ICD-10-CM

## 2014-05-01 MED ORDER — LISINOPRIL 20 MG PO TABS: 20.0000 mg | ORAL_TABLET | Freq: Every day | ORAL | Status: DC

## 2014-06-06 ENCOUNTER — Other Ambulatory Visit: Payer: Self-pay | Admitting: *Deleted

## 2014-06-06 DIAGNOSIS — R030 Elevated blood-pressure reading, without diagnosis of hypertension: Principal | ICD-10-CM

## 2014-06-06 DIAGNOSIS — IMO0001 Reserved for inherently not codable concepts without codable children: Secondary | ICD-10-CM

## 2014-06-06 MED ORDER — LISINOPRIL 20 MG PO TABS: 20.0000 mg | ORAL_TABLET | Freq: Every day | ORAL | Status: DC

## 2014-07-07 ENCOUNTER — Other Ambulatory Visit: Payer: Self-pay

## 2014-07-07 DIAGNOSIS — R030 Elevated blood-pressure reading, without diagnosis of hypertension: Principal | ICD-10-CM

## 2014-07-07 DIAGNOSIS — IMO0001 Reserved for inherently not codable concepts without codable children: Secondary | ICD-10-CM

## 2014-07-07 MED ORDER — LISINOPRIL 20 MG PO TABS: 20.0000 mg | ORAL_TABLET | Freq: Every day | ORAL | Status: DC

## 2014-07-07 NOTE — Telephone Encounter (Signed)
I told patient that we needed to schedule a F/U before refilling his medicine. [place]. I talked with Gardiner RamusMichelle Alyscia Carmon,CMA about refilling his rx per his circumstance she said that it was fine to do. I told patient that we needed to schedule an appointment and she said he wanted to wait to schedule it due to his schedule. I told him that if he did not call us before his month was up to make appointment that we would not be able refill his medicine due to not being monitored. He understood and will CB at the beginning of next week to schedule appointment.

## 2014-08-01 ENCOUNTER — Ambulatory Visit (INDEPENDENT_AMBULATORY_CARE_PROVIDER_SITE_OTHER): Payer: Self-pay | Admitting: Family Medicine

## 2014-08-01 ENCOUNTER — Telehealth: Payer: Self-pay | Admitting: Nurse Practitioner

## 2014-08-01 ENCOUNTER — Encounter: Payer: Self-pay | Admitting: Family Medicine

## 2014-08-01 VITALS — BP 126/85 | HR 59 | Temp 97.8°F | Ht 70.5 in | Wt 212.0 lb

## 2014-08-01 DIAGNOSIS — J018 Other acute sinusitis: Secondary | ICD-10-CM

## 2014-08-01 DIAGNOSIS — J209 Acute bronchitis, unspecified: Secondary | ICD-10-CM

## 2014-08-01 DIAGNOSIS — F17200 Nicotine dependence, unspecified, uncomplicated: Secondary | ICD-10-CM

## 2014-08-01 DIAGNOSIS — Z720 Tobacco use: Secondary | ICD-10-CM

## 2014-08-01 DIAGNOSIS — J42 Unspecified chronic bronchitis: Secondary | ICD-10-CM

## 2014-08-01 MED ORDER — ALBUTEROL SULFATE HFA 108 (90 BASE) MCG/ACT IN AERS
2.0000 | INHALATION_SPRAY | Freq: Four times a day (QID) | RESPIRATORY_TRACT | Status: DC | PRN
Start: 2014-08-01 — End: 2015-06-14

## 2014-08-01 MED ORDER — PREDNISONE 20 MG PO TABS: ORAL_TABLET | ORAL | Status: DC

## 2014-08-01 MED ORDER — AZITHROMYCIN 250 MG PO TABS: ORAL_TABLET | ORAL | Status: DC

## 2014-08-01 MED ORDER — HYDROCODONE-HOMATROPINE 5-1.5 MG/5ML PO SYRP: ORAL_SOLUTION | ORAL | Status: DC

## 2014-08-01 NOTE — Progress Notes (Signed)
Pre visit review using our clinic review tool, if applicable. No additional management support is needed unless otherwise documented below in the visit note. 

## 2014-08-01 NOTE — Progress Notes (Signed)
OFFICE NOTE  08/01/2014  CC:  Chief Complaint  Patient presents with  . Allergies    been fighting for about 6 weeks     HPI: Patient is a 54 y.o. Caucasian male who is here for respiratory symptoms. Onset about 6 wks ago of nasal congestion, sneezing, sore throat.  He has a chronic smoker's cough. No fevers.  Some malaise at onset but this has resolved.  He feels like he has bad PND and coughs up mucous was initially clear/runny and lately has been thick and green. Has tried OTC allergy med that works sometimes/sometimes not.  Robitussin and mucinex DM not helpful. Says coughing is much worse than his baseline, feels a little SOB but denies chest tightness or wheezing.  He is a current smoker, hx of 2 packs per day x decades.  Has been rx'd albuterol inhaler in the past but he says it did not help when rx'd in the past.   Pertinent PMH:  PMH and PSH reviewed.  MEDS: (He has been on his ACE-I for about 10 yrs) Outpatient Prescriptions Prior to Visit  Medication Sig Dispense Refill  . aspirin 325 MG tablet Take 650 mg by mouth every 6 (six) hours as needed. For pain    . lisinopril-hydrochlorothiazide (PRINZIDE,ZESTORETIC) 10-12.5 MG per tablet Take 1 tablet by mouth daily. 90 tablet 1  . albuterol (PROVENTIL HFA;VENTOLIN HFA) 108 (90 BASE) MCG/ACT inhaler Inhale 2 puffs into the lungs every 6 (six) hours as needed for wheezing. (Patient not taking: Reported on 08/01/2014) 1 Inhaler 5  . lisinopril (PRINIVIL,ZESTRIL) 20 MG tablet Take 1 tablet (20 mg total) by mouth daily. (Patient not taking: Reported on 08/01/2014) 30 tablet 0  . LORazepam (ATIVAN) 0.5 MG tablet Take 1 tablet (0.5 mg total) by mouth 2 (two) times daily as needed for anxiety. (Patient not taking: Reported on 08/01/2014) 30 tablet 0   No facility-administered medications prior to visit.    PE: Blood pressure 126/85, pulse 59, temperature 97.8 F (36.6 C), temperature source Temporal, height 5' 10.5" (1.791 m),  weight 212 lb (96.163 kg), SpO2 95 %. Gen: Alert, well appearing.  Patient is oriented to person, place, time, and situation. He is coughing nearly constantly.  No resp distress. HEENT: eyes without injection, drainage, or swelling.  Ears: EACs clear, TMs with normal light reflex and landmarks.  Nose: Clear rhinorrhea, with some dried, crusty exudate adherent to mildly injected mucosa.  No purulent d/c.  No paranasal sinus TTP.  No facial swelling.  Throat and mouth without focal lesion.  No pharyngial swelling, erythema, or exudate.   Neck: supple, no LAD.   LUNGS: bilat diffuse exp wheezing, no insp crackles but occ insp rhonchorus sound, nonlabored resps.   Lots of post-exhalation coughing. CV: RRR, no m/r/g. EXT: no c/c/e SKIN: no rash   IMPRESSION AND PLAN:  Prolonged URI/possible bacterial sinusitis, with acute exacerbation of chronic bronchitis. Ongoing tobacco abuse.  Alb/atr neb in office today: improved aeration, pt able to take deep breaths much easier and had MUCH less coughing. Prednisone taper 40 qd x 5, then 20 qd x 5, then 10 qd x 4.  Azithromycin x 5d. Robitussin DM OTC prn.  Hycodan 1-2 tsp qhs prn, #[id number]. Refilled albuterol HFA and CMA did inhaler education today. Signs/symptoms to call or return for were reviewed and pt expressed understanding.  An After Visit Summary was printed and given to the patient.  FOLLOW UP: prn

## 2014-08-01 NOTE — Telephone Encounter (Signed)
emmi mailed  °

## 2014-08-07 ENCOUNTER — Ambulatory Visit: Payer: Self-pay | Admitting: Nurse Practitioner

## 2014-08-07 ENCOUNTER — Other Ambulatory Visit: Payer: Self-pay

## 2014-08-07 ENCOUNTER — Telehealth: Payer: Self-pay | Admitting: Nurse Practitioner

## 2014-08-07 DIAGNOSIS — I10 Essential (primary) hypertension: Secondary | ICD-10-CM

## 2014-08-07 MED ORDER — LISINOPRIL-HYDROCHLOROTHIAZIDE 10-12.5 MG PO TABS: 1.0000 | ORAL_TABLET | Freq: Every day | ORAL | Status: DC

## 2014-08-07 NOTE — Telephone Encounter (Signed)
Please Advise Refill Request? Refill request for- Lisinopril Last filled by MD on - 03/11/13 Last Appt - 08/01/14        Next Appt - 08/21/14 Pharmacy- [facility]

## 2014-08-07 NOTE — Telephone Encounter (Signed)
pls call pt: Advise i sent 30 days of bp med.  He must make appt. To f/u & will need labs, does not need to fast.

## 2014-08-07 NOTE — Telephone Encounter (Signed)
LMOVM to have patient to call back to remind him of his appt.   Patient has a f/u appt already scheduled for 08/21/14 at 8am.

## 2014-08-08 NOTE — Telephone Encounter (Signed)
Called patient again, no answer

## 2014-08-21 ENCOUNTER — Ambulatory Visit: Payer: Self-pay | Admitting: Nurse Practitioner

## 2014-10-09 ENCOUNTER — Other Ambulatory Visit: Payer: Self-pay | Admitting: Family Medicine

## 2014-10-09 DIAGNOSIS — I10 Essential (primary) hypertension: Secondary | ICD-10-CM

## 2014-10-09 MED ORDER — LISINOPRIL-HYDROCHLOROTHIAZIDE 10-12.5 MG PO TABS: 1.0000 | ORAL_TABLET | Freq: Every day | ORAL | Status: DC

## 2014-10-09 NOTE — Telephone Encounter (Signed)
I will send in 30 days only. Pls schedule OV to review HTN.

## 2014-10-09 NOTE — Telephone Encounter (Signed)
Last OV with Layne 12/30/13.  Did have a sick visit 07/31/13.  Please advise.

## 2014-11-03 ENCOUNTER — Other Ambulatory Visit: Payer: Self-pay | Admitting: Family Medicine

## 2014-11-03 DIAGNOSIS — I10 Essential (primary) hypertension: Secondary | ICD-10-CM

## 2014-11-03 NOTE — Telephone Encounter (Signed)
Refuse script. He needs OV. This is repeat of his request in April.

## 2014-11-03 NOTE — Telephone Encounter (Signed)
Spoke to pt's mom.  Okay per DPR.  Pt is out of town for work.   I told her that he needs to be seen for further refills.  She states she will get in touch with pt and see when he will be able to schedule.

## 2014-12-11 ENCOUNTER — Other Ambulatory Visit: Payer: Self-pay | Admitting: Family Medicine

## 2014-12-11 DIAGNOSIS — I10 Essential (primary) hypertension: Secondary | ICD-10-CM

## 2015-05-28 ENCOUNTER — Encounter: Payer: Self-pay | Admitting: Emergency Medicine

## 2015-05-28 ENCOUNTER — Emergency Department (INDEPENDENT_AMBULATORY_CARE_PROVIDER_SITE_OTHER)
Admission: EM | Admit: 2015-05-28 | Discharge: 2015-05-28 | Disposition: A | Discharge status: Home / Self Care | Payer: Self-pay | Source: Home / Self Care | Attending: Family Medicine | Admitting: Family Medicine

## 2015-05-28 DIAGNOSIS — M25561 Pain in right knee: Secondary | ICD-10-CM

## 2015-05-28 DIAGNOSIS — M25461 Effusion, right knee: Secondary | ICD-10-CM

## 2015-05-28 DIAGNOSIS — F17200 Nicotine dependence, unspecified, uncomplicated: Secondary | ICD-10-CM

## 2015-05-28 DIAGNOSIS — Z760 Encounter for issue of repeat prescription: Secondary | ICD-10-CM

## 2015-05-28 DIAGNOSIS — M25562 Pain in left knee: Secondary | ICD-10-CM

## 2015-05-28 DIAGNOSIS — I10 Essential (primary) hypertension: Secondary | ICD-10-CM

## 2015-05-28 DIAGNOSIS — Z720 Tobacco use: Secondary | ICD-10-CM

## 2015-05-28 MED ORDER — LISINOPRIL-HYDROCHLOROTHIAZIDE 10-12.5 MG PO TABS: 1.0000 | ORAL_TABLET | Freq: Every day | ORAL | Status: DC

## 2015-05-28 MED ORDER — MELOXICAM 7.5 MG PO TABS: 7.5000 mg | ORAL_TABLET | Freq: Every day | ORAL | Status: DC

## 2015-05-28 MED ORDER — HYDROCODONE-ACETAMINOPHEN 5-325 MG PO TABS: 1.0000 | ORAL_TABLET | Freq: Four times a day (QID) | ORAL | Status: DC | PRN

## 2015-05-28 NOTE — Discharge Instructions (Signed)
Meloxicam (Mobic) is an antiinflammatory to help with pain and inflammation.  Do not take ibuprofen, Advil, Aleve, or any other medications that contain NSAIDs while taking meloxicam as this may cause stomach upset or even ulcers if taken in large amounts for an extended period of time.   Norco/Vicodin (hydrocodone-acetaminophen) is a narcotic pain medication, do not combine these medications with others containing tylenol. While taking, do not drink alcohol, drive, or perform any other activities that requires focus while taking these medications.

## 2015-05-28 NOTE — ED Provider Notes (Signed)
CSN: [id number]     Arrival date & time 05/28/15  1816 History   None    Chief Complaint  Patient presents with  . Knee Pain   (Consider location/radiation/quality/duration/timing/severity/associated sxs/prior Treatment) Patient is a 55 y.o. male presenting with knee pain.  Knee Pain Associated symptoms: no back pain    Pt is a 55yo male presenting to [facility] with c/o bilateral knee pain intermittently for 2 months. 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  Pt notes about 1 week ago he was standing on a slanted roof for about 6 hours which exacerbated his knee pain. Pain is aching and throbbing, intermittent mild swelling of his Right knee which is worse for him, 4/10 at rest but worse with ambulation and palpation to the medial aspect of his knees.  He has taken tylenol and motrin with some relief but notes a co-worker did give him vicodin earlier and that helped take the edge off.  Denies specific injuries to his knees. Denies hx of knee surgeries.  Blood pressure noted to be elevated in triage. Pt reports hx of HTN but has not been on BP medication for 4-6 months due to lack of insurance.  He denies chest pain, SOB, headache or dizziness.  He was advised by his PCP during his last visit he was "on the bubble" about to be off the medication due to recent weight loss at that time.  Pt does report hx of daily smoking but is motivated to stop due to today's BP reading.   Past Medical History  Diagnosis Date  . Hypertension   . Cigar smoker motivated to quit   . H/O sleep apnea    History reviewed. No pertinent past surgical history. Family History  Problem Relation Age of Onset  . Hypertension Mother   . COPD Mother   . Emphysema Father    Social History  Substance Use Topics  . Smoking status: Heavy Tobacco Smoker -- 1.50 packs/day for 30 years    Types: Cigarettes  . Smokeless tobacco: None     Comment: discussed  benefits of quitting  . Alcohol Use: Yes     Comment: 6-12 beers daily    Review of Systems  Cardiovascular: Negative for chest pain and palpitations.  Musculoskeletal: Positive for myalgias, joint swelling and arthralgias. Negative for back pain and gait problem.  Skin: Negative for color change and wound.  Neurological: Negative for dizziness, weakness, light-headedness, numbness and headaches.    Allergies  Review of patient's allergies indicates no known allergies.  Home Medications   Prior to Admission medications   Medication Sig Start Date End Date Taking? Authorizing Provider  albuterol (PROVENTIL HFA;VENTOLIN HFA) 108 (90 BASE) MCG/ACT inhaler Inhale 2 puffs into the lungs every 6 (six) hours as needed for wheezing. 08/01/14   Jeoffrey Massed, MD  aspirin 325 MG tablet Take 650 mg by mouth every 6 (six) hours as needed. For pain    Historical Provider, MD  azithromycin (ZITHROMAX) 250 MG tablet 2 tabs po qd x 1d, then 1 tab po qd x 4d 08/01/14   Jeoffrey Massed, MD  HYDROcodone-acetaminophen (NORCO/VICODIN) 5-325 MG tablet Take 1 tablet by mouth every 6 (six) hours as needed for moderate pain or severe pain. 05/28/15   Junius Finner, PA-C  HYDROcodone-homatropine [facility]) 5-1.5 MG/5ML syrup 1-2 tsp po qhs prn cough 08/01/14   Jeoffrey Massed, MD  lisinopril-hydrochlorothiazide [facility] Anger)  10-12.5 MG tablet Take 1 tablet by mouth daily. 05/28/15   Junius Finner, PA-C  LORazepam (ATIVAN) 0.5 MG tablet Take 1 tablet (0.5 mg total) by mouth 2 (two) times daily as needed for anxiety. Patient not taking: Reported on 08/01/2014 02/11/13   Kelle Darting, NP  meloxicam (MOBIC) 7.5 MG tablet Take 1 tablet (7.5 mg total) by mouth daily. Take 1-2 tabs daily for 5-7 days, then daily as needed for pain 05/28/15   Junius Finner, PA-C  predniSONE (DELTASONE) 20 MG tablet 2 tabs po qd x 5d, then 1 tab po qd x 5d, then 1/2 tab po qd x 4d 08/01/14   Jeoffrey Massed, MD   Meds Ordered and  Administered this Visit  Medications - No data to display  BP 177/97 mmHg  Pulse 70  Temp(Src) 97.5 F (36.4 C) (Oral)  Resp 20  Ht 6' (1.829 m)  Wt 220 lb (99.791 kg)  BMI 29.83 kg/m2  SpO2 95% No data found.   Physical Exam  Constitutional: He is oriented to person, place, and time. He appears well-developed and well-nourished.  HENT:  Head: Normocephalic and atraumatic.  Eyes: EOM are normal.  Neck: Normal range of motion.  Cardiovascular: Normal rate.   Pulmonary/Chest: Effort normal.  Musculoskeletal: Normal range of motion. He exhibits edema and tenderness.  Right knee: mild edema, tenderness along medial aspect of knee but not along joint space. Full ROM.  Calf is soft, non-tender. Left knee: no edema. Tenderness to medial aspect of knee but not in joint space. Full ROM. Calf is soft, non-tender. Normal gait.  Neurological: He is alert and oriented to person, place, and time.  Skin: Skin is warm and dry. No erythema.  Bilateral knees: skin in tact, no erythema, ecchymosis or warmth  Psychiatric: He has a normal mood and affect. His behavior is normal.  Nursing note and vitals reviewed.   ED Course  Procedures (including critical care time)  Labs Review Labs Reviewed - No data to display  Imaging Review No results found.    MDM   1. Bilateral knee pain   2. Knee swelling, right   3. Medication refill   4. Essential hypertension, benign   5. Current smoker    Pt c/o bilateral knee pain w/o specific known injury.  Pain intermittent for 2 months.  Pain likely due to musculoskeletal pain. Doubt DVT.  No evidence of cellulitis, gout, or septic joint.  Discussed plain films. Pt waiting on insurance to kick in. Would like to try conservative treatment and plans to f/u with PCP/Sports Medicine if medications and OTC knee sleeves do not help.  Pt does have elevated BP in UC.  No red flag symptoms. Will restart pt on his BP medication. Encouraged to stop smoking  and to f/u with PCP for recheck of BP and medication refills. Resources provided.    Junius Finner, PA-C 05/28/15 1907

## 2015-05-28 NOTE — ED Notes (Signed)
Reports pain in both knees; 2 months ago went into squat and felt popping in right knee; this past week left knee painful.

## 2015-06-06 ENCOUNTER — Emergency Department (HOSPITAL_COMMUNITY)
Admission: EM | Admit: 2015-06-06 | Discharge: 2015-06-06 | Disposition: A | Discharge status: Home / Self Care | Payer: BLUE CROSS/BLUE SHIELD | Attending: Physician Assistant | Admitting: Physician Assistant

## 2015-06-06 ENCOUNTER — Emergency Department (HOSPITAL_COMMUNITY): Payer: BLUE CROSS/BLUE SHIELD

## 2015-06-06 ENCOUNTER — Encounter (HOSPITAL_COMMUNITY): Payer: Self-pay

## 2015-06-06 DIAGNOSIS — Z8669 Personal history of other diseases of the nervous system and sense organs: Secondary | ICD-10-CM | POA: Diagnosis not present

## 2015-06-06 DIAGNOSIS — F17210 Nicotine dependence, cigarettes, uncomplicated: Secondary | ICD-10-CM | POA: Diagnosis not present

## 2015-06-06 DIAGNOSIS — I10 Essential (primary) hypertension: Secondary | ICD-10-CM | POA: Insufficient documentation

## 2015-06-06 DIAGNOSIS — R05 Cough: Secondary | ICD-10-CM | POA: Diagnosis not present

## 2015-06-06 DIAGNOSIS — Z79899 Other long term (current) drug therapy: Secondary | ICD-10-CM | POA: Diagnosis not present

## 2015-06-06 LAB — I-STAT CHEM 8, ED
BUN: 12 mg/dL (ref 6–20)
CREATININE: 0.8 mg/dL (ref 0.61–1.24)
Calcium, Ion: 1.14 mmol/L (ref 1.12–1.23)
Chloride: 98 mmol/L — ABNORMAL LOW (ref 101–111)
Glucose, Bld: 97 mg/dL (ref 65–99)
HEMATOCRIT: 44 % (ref 39.0–52.0)
HEMOGLOBIN: 15 g/dL (ref 13.0–17.0)
Potassium: 4.1 mmol/L (ref 3.5–5.1)
Sodium: 138 mmol/L (ref 135–145)
TCO2: 29 mmol/L (ref 0–100)

## 2015-06-06 NOTE — ED Notes (Addendum)
Pt c/o hypertension x 6 months and bilateral knee pain x 1 month.  Pain score 4/10.  Pt ran out of HTN medication x 2 months ago.  Pt reports injuring knees at work.  Pt was seen at Urgent Care last week for same.  Pt has not followed up w/ Ortho or PCP.  Sts his "insurance card just started working."  Pt concerned that prescribed HTN medication is not working and that he ran out of his Vicodin.  Pt educated that we do not manage chronic conditions in the ED.

## 2015-06-06 NOTE — ED Provider Notes (Signed)
CSN: [id number]     Arrival date & time 06/06/15  0700 History   First MD Initiated Contact with Patient 06/06/15 0734     No chief complaint on file.    (Consider location/radiation/quality/duration/timing/severity/associated sxs/prior Treatment) Patient is a 55 y.o. male presenting with hypertension.  Hypertension   Todd Mercado is a 55 y.o. male with PMH significant for HTN, and current every day smoker (1 ppd) who presents with concerns about hypertension.  Patient reports hx of HTN and he was on lisinopril-hyrochlorothiazide at one point in the past.  He then lost weight and quit drinking and his HTN resolved.  He noticed about 6 months ago that his BP was slightly elevated around 140/90.  He was seen at [facility] 05/28/15 and his BP was found to be 177/97.  He was restarted on lisinopril-HCTZ.  He states he awoke this morning with a slight headache which resolved once he took his medication.  He states his BP was 197/119 at that time.  He denies any symptoms or complaints at this time.  Denies HA, CP, SOB, or abdominal pain.  He reports he has not seen his PCP [facility]) due to insurance issues, but states his insurance kicked in today.  Patient was also seen at [facility] for knee pain and discharged home with norco and meloxicam after negative plain films.  I informed patient we would not refill his norco prescription, and he then stated he was not concerned about his knee pain, and just wanted to check on his blood pressure.  His symptoms are constant, mild, and unchanged.   Patient also states he has been experiencing an increase in sputum production and cough for the past couple of days.      Past Medical History  Diagnosis Date  . Hypertension   . Cigar smoker motivated to quit   . H/O sleep apnea    History reviewed. No pertinent past surgical history. Family History  Problem Relation Age of Onset  . Hypertension Mother   . COPD Mother   . Emphysema Father    Social History   Substance Use Topics  . Smoking status: Heavy Tobacco Smoker -- 1.50 packs/day for 30 years    Types: Cigarettes  . Smokeless tobacco: None     Comment: discussed benefits of quitting  . Alcohol Use: Yes     Comment: 6-12 beers daily    Review of Systems  All other systems negative unless otherwise stated in HPI   Allergies  Review of patient's allergies indicates no known allergies.  Home Medications   Prior to Admission medications   Medication Sig Start Date End Date Taking? Authorizing Provider  acetaminophen (TYLENOL) 500 MG tablet Take 1,000 mg by mouth every 6 (six) hours as needed for moderate pain.   Yes Historical Provider, MD  albuterol (PROVENTIL HFA;VENTOLIN HFA) 108 (90 BASE) MCG/ACT inhaler Inhale 2 puffs into the lungs every 6 (six) hours as needed for wheezing. 08/01/14  Yes Jeoffrey Massed, MD  Aspirin-Acetaminophen-Caffeine (GOODY HEADACHE PO) Take 2 packets by mouth 2 (two) times daily as needed (pain).   Yes Historical Provider, MD  guaifenesin (ROBITUSSIN) 100 MG/5ML syrup Take 200 mg by mouth 3 (three) times daily as needed for cough.   Yes Historical Provider, MD  HYDROcodone-acetaminophen (NORCO/VICODIN) 5-325 MG tablet Take 1 tablet by mouth every 6 (six) hours as needed for moderate pain or severe pain. 05/28/15  Yes Junius Finner, PA-C  lisinopril-hydrochlorothiazide (PRINZIDE,ZESTORETIC) 10-12.5 MG tablet Take 1 tablet  by mouth daily. 05/28/15  Yes Junius Finner, PA-C  meloxicam (MOBIC) 7.5 MG tablet Take 1 tablet (7.5 mg total) by mouth daily. Take 1-2 tabs daily for 5-7 days, then daily as needed for pain 05/28/15  Yes Junius Finner, PA-C  azithromycin (ZITHROMAX) 250 MG tablet 2 tabs po qd x 1d, then 1 tab po qd x 4d 08/01/14   Jeoffrey Massed, MD   BP 151/98 mmHg  Pulse 52  Temp(Src) 97.7 F (36.5 C) (Oral)  Resp 19  Ht 6' (1.829 m)  Wt 99.791 kg  BMI 29.83 kg/m2  SpO2 96% Physical Exam  Constitutional: He is oriented to person, place, and time.  He appears well-developed and well-nourished.  Non-toxic appearance. He does not have a sickly appearance. He does not appear ill.  HENT:  Head: Normocephalic and atraumatic.  Mouth/Throat: Oropharynx is clear and moist.  Eyes: Conjunctivae are normal. Pupils are equal, round, and reactive to light.  Neck: Normal range of motion. Neck supple.  Cardiovascular: Normal rate, regular rhythm and normal heart sounds.   No murmur heard. Pulmonary/Chest: Effort normal. No accessory muscle usage or stridor. No respiratory distress. He has no wheezes. He has rhonchi (right side). He has rales (right side).  Patient able to speak in clear and full sentences without difficulty.  Oxygen saturation 96% on RA.   Abdominal: Soft. Bowel sounds are normal. He exhibits no distension. There is no tenderness.  Musculoskeletal: Normal range of motion.  Lymphadenopathy:    He has no cervical adenopathy.  Neurological: He is alert and oriented to person, place, and time.  Speech clear without dysarthria.  Skin: Skin is warm and dry.  Psychiatric: He has a normal mood and affect. His behavior is normal.    ED Course  Procedures (including critical care time) Labs Review Labs Reviewed  I-STAT CHEM 8, ED - Abnormal; Notable for the following:    Chloride 98 (*)    All other components within normal limits    Imaging Review Dg Chest 2 View  06/06/2015  CLINICAL DATA:  Hypertension.  History of smoking. EXAM: CHEST  2 VIEW COMPARISON:  None. FINDINGS: Cardiomediastinal silhouette is normal. Mediastinal contours appear intact. There is no evidence of focal airspace consolidation, pleural effusion or pneumothorax. Osseous structures are without acute abnormality. Soft tissues are grossly normal. IMPRESSION: No active cardiopulmonary disease. Electronically Signed   By: Ted Mcalpine M.D.   On: 06/06/2015 08:54   I have personally reviewed and evaluated these images and lab results as part of my medical  decision-making.   EKG Interpretation   Date/Time:  Wednesday June 06 2015 08:48:30 EST Ventricular Rate:  51 PR Interval:  164 QRS Duration: 104 QT Interval:  456 QTC Calculation: 420 R Axis:   54 Text Interpretation:  Sinus rhythm No old tracing to compare Confirmed by  [facility]  MD, ELLIOTT [phone]) on 06/06/2015 8:59:32 AM      MDM   Final diagnoses:  Essential hypertension    Patient presents with concern about his HTN.  Currently taking lisinopril-HCTZ.  BP in the ED 171/114.  He is asymptomatic.  Patient with increased productive cough and smoking history.  Will obtain CXR. i-stat chem 8 unremarkable.   EKG shows NSR, no acute changes.  CXR unremarkable.  Given smoking history and cough, will d/c home with albuterol inhaler. Per ACEP guidelines, no indication for treating asymptomatic hypertension in the ED, and follow up with PCP. Discussed return precautions.  Patient agrees and acknowledges  the above plan for discharge. Case has been discussed with Dr. Corlis Leak who agrees with the above plan for discharge.      Cheri Fowler, PA-C 06/06/15 1610  Courteney Randall An, MD 06/06/15 1019  Courteney Randall An, MD 06/06/15 1020

## 2015-06-06 NOTE — Discharge Instructions (Signed)
Hypertension Hypertension, commonly called high blood pressure, is when the force of blood pumping through your arteries is too strong. Your arteries are the blood vessels that carry blood from your heart throughout your body. A blood pressure reading consists of a higher number over a lower number, such as 110/72. The higher number (systolic) is the pressure inside your arteries when your heart pumps. The lower number (diastolic) is the pressure inside your arteries when your heart relaxes. Ideally you want your blood pressure below 120/80. Hypertension forces your heart to work harder to pump blood. Your arteries may become narrow or stiff. Having untreated or uncontrolled hypertension can cause heart attack, stroke, kidney disease, and other problems. RISK FACTORS Some risk factors for high blood pressure are controllable. Others are not.  Risk factors you cannot control include:   Race. You may be at higher risk if you are African American.  Age. Risk increases with age.  Gender. Men are at higher risk than women before age 45 years. After age 65, women are at higher risk than men. Risk factors you can control include:  Not getting enough exercise or physical activity.  Being overweight.  Getting too much fat, sugar, calories, or salt in your diet.  Drinking too much alcohol. SIGNS AND SYMPTOMS Hypertension does not usually cause signs or symptoms. Extremely high blood pressure (hypertensive crisis) may cause headache, anxiety, shortness of breath, and nosebleed. DIAGNOSIS To check if you have hypertension, your health care provider will measure your blood pressure while you are seated, with your arm held at the level of your heart. It should be measured at least twice using the same arm. Certain conditions can cause a difference in blood pressure between your right and left arms. A blood pressure reading that is higher than normal on one occasion does not mean that you need treatment. If  it is not clear whether you have high blood pressure, you may be asked to return on a different day to have your blood pressure checked again. Or, you may be asked to monitor your blood pressure at home for 1 or more weeks. TREATMENT Treating high blood pressure includes making lifestyle changes and possibly taking medicine. Living a healthy lifestyle can help lower high blood pressure. You may need to change some of your habits. Lifestyle changes may include:  Following the DASH diet. This diet is high in fruits, vegetables, and whole grains. It is low in salt, red meat, and added sugars.  Keep your sodium intake below 2,300 mg per day.  Getting at least 30-45 minutes of aerobic exercise at least 4 times per week.  Losing weight if necessary.  Not smoking.  Limiting alcoholic beverages.  Learning ways to reduce stress. Your health care provider may prescribe medicine if lifestyle changes are not enough to get your blood pressure under control, and if one of the following is true:  You are 18-59 years of age and your systolic blood pressure is above 140.  You are 60 years of age or older, and your systolic blood pressure is above 150.  Your diastolic blood pressure is above 90.  You have diabetes, and your systolic blood pressure is over 140 or your diastolic blood pressure is over 90.  You have kidney disease and your blood pressure is above 140/90.  You have heart disease and your blood pressure is above 140/90. Your personal target blood pressure may vary depending on your medical conditions, your age, and other factors. HOME CARE INSTRUCTIONS    Have your blood pressure rechecked as directed by your health care provider.   Take medicines only as directed by your health care provider. Follow the directions carefully. Blood pressure medicines must be taken as prescribed. The medicine does not work as well when you skip doses. Skipping doses also puts you at risk for  problems.  Do not smoke.   Monitor your blood pressure at home as directed by your health care provider. SEEK MEDICAL CARE IF:   You think you are having a reaction to medicines taken.  You have recurrent headaches or feel dizzy.  You have swelling in your ankles.  You have trouble with your vision. SEEK IMMEDIATE MEDICAL CARE IF:  You develop a severe headache or confusion.  You have unusual weakness, numbness, or feel faint.  You have severe chest or abdominal pain.  You vomit repeatedly.  You have trouble breathing. MAKE SURE YOU:   Understand these instructions.  Will watch your condition.  Will get help right away if you are not doing well or get worse.   This information is not intended to replace advice given to you by your health care provider. Make sure you discuss any questions you have with your health care provider.   Document Released: 04/21/2005 Document Revised: 09/05/2014 Document Reviewed: 02/11/2013 Elsevier Interactive Patient Education 2016 Elsevier Inc[employer] DASH Eating Plan DASH stands for "Dietary Approaches to Stop Hypertension." The DASH eating plan is a healthy eating plan that has been shown to reduce high blood pressure (hypertension). Additional health benefits may include reducing the risk of type 2 diabetes mellitus, heart disease, and stroke. The DASH eating plan may also help with weight loss. WHAT DO I NEED TO KNOW ABOUT THE DASH EATING PLAN? For the DASH eating plan, you will follow these general guidelines:  Choose foods with a percent daily value for sodium of less than 5% (as listed on the food label).  Use salt-free seasonings or herbs instead of table salt or sea salt.  Check with your health care provider or pharmacist before using salt substitutes.  Eat lower-sodium products, often labeled as "lower sodium" or "no salt added."  Eat fresh foods.  Eat more vegetables, fruits, and low-fat dairy products.  Choose whole grains.  Look for the word "whole" as the first word in the ingredient list.  Choose fish and skinless chicken or turkey more often than red meat. Limit fish, poultry, and meat to 6 oz (170 g) each day.  Limit sweets, desserts, sugars, and sugary drinks.  Choose heart-healthy fats.  Limit cheese to 1 oz (28 g) per day.  Eat more home-cooked food and less restaurant, buffet, and fast food.  Limit fried foods.  Cook foods using methods other than frying.  Limit canned vegetables. If you do use them, rinse them well to decrease the sodium.  When eating at a restaurant, ask that your food be prepared with less salt, or no salt if possible. WHAT FOODS CAN I EAT? Seek help from a dietitian for individual calorie needs. Grains Whole grain or whole wheat bread. Brown rice. Whole grain or whole wheat pasta. Quinoa, bulgur, and whole grain cereals. Low-sodium cereals. Corn or whole wheat flour tortillas. Whole grain cornbread. Whole grain crackers. Low-sodium crackers. Vegetables Fresh or frozen vegetables (raw, steamed, roasted, or grilled). Low-sodium or reduced-sodium tomato and vegetable juices. Low-sodium or reduced-sodium tomato sauce and paste. Low-sodium or reduced-sodium canned vegetables.  Fruits All fresh, canned (in natural juice), or frozen fruits. Meat and Other   Protein Products Ground beef (85% or leaner), grass-fed beef, or beef trimmed of fat. Skinless chicken or turkey. Ground chicken or turkey. Pork trimmed of fat. All fish and seafood. Eggs. Dried beans, peas, or lentils. Unsalted nuts and seeds. Unsalted canned beans. Dairy Low-fat dairy products, such as skim or 1% milk, 2% or reduced-fat cheeses, low-fat ricotta or cottage cheese, or plain low-fat yogurt. Low-sodium or reduced-sodium cheeses. Fats and Oils Tub margarines without trans fats. Light or reduced-fat mayonnaise and salad dressings (reduced sodium). Avocado. Safflower, olive, or canola oils. Natural peanut or almond  butter. Other Unsalted popcorn and pretzels. The items listed above may not be a complete list of recommended foods or beverages. Contact your dietitian for more options. WHAT FOODS ARE NOT RECOMMENDED? Grains White bread. White pasta. White rice. Refined cornbread. Bagels and croissants. Crackers that contain trans fat. Vegetables Creamed or fried vegetables. Vegetables in a cheese sauce. Regular canned vegetables. Regular canned tomato sauce and paste. Regular tomato and vegetable juices. Fruits Dried fruits. Canned fruit in light or heavy syrup. Fruit juice. Meat and Other Protein Products Fatty cuts of meat. Ribs, chicken wings, bacon, sausage, bologna, salami, chitterlings, fatback, hot dogs, bratwurst, and packaged luncheon meats. Salted nuts and seeds. Canned beans with salt. Dairy Whole or 2% milk, cream, half-and-half, and cream cheese. Whole-fat or sweetened yogurt. Full-fat cheeses or blue cheese. Nondairy creamers and whipped toppings. Processed cheese, cheese spreads, or cheese curds. Condiments Onion and garlic salt, seasoned salt, table salt, and sea salt. Canned and packaged gravies. Worcestershire sauce. Tartar sauce. Barbecue sauce. Teriyaki sauce. Soy sauce, including reduced sodium. Steak sauce. Fish sauce. Oyster sauce. Cocktail sauce. Horseradish. Ketchup and mustard. Meat flavorings and tenderizers. Bouillon cubes. Hot sauce. Tabasco sauce. Marinades. Taco seasonings. Relishes. Fats and Oils Butter, stick margarine, lard, shortening, ghee, and bacon fat. Coconut, palm kernel, or palm oils. Regular salad dressings. Other Pickles and olives. Salted popcorn and pretzels. The items listed above may not be a complete list of foods and beverages to avoid. Contact your dietitian for more information. WHERE CAN I FIND MORE INFORMATION? National Heart, Lung, and Blood Institute: www.nhlbi.nih.gov/health/health-topics/topics/dash/   This information is not intended to replace  advice given to you by your health care provider. Make sure you discuss any questions you have with your health care provider.   Document Released: 04/10/2011 Document Revised: 05/12/2014 Document Reviewed: 02/23/2013 Elsevier Interactive Patient Education 2016 Elsevier Inc.  

## 2015-06-06 NOTE — ED Notes (Signed)
Went to discharge patient, he's no longer in the room, other patients state they saw him leaving. Will take him out of the system. Unable to re-evaluate pain and vitals, discharge instructions discussed with patient by PA prior to discharge.

## 2015-06-14 ENCOUNTER — Encounter: Payer: Self-pay | Admitting: Family Medicine

## 2015-06-14 ENCOUNTER — Ambulatory Visit (INDEPENDENT_AMBULATORY_CARE_PROVIDER_SITE_OTHER): Payer: BLUE CROSS/BLUE SHIELD | Admitting: Family Medicine

## 2015-06-14 VITALS — BP 173/81 | HR 71 | Wt 219.0 lb

## 2015-06-14 DIAGNOSIS — M25562 Pain in left knee: Secondary | ICD-10-CM

## 2015-06-14 DIAGNOSIS — M25561 Pain in right knee: Secondary | ICD-10-CM | POA: Insufficient documentation

## 2015-06-14 NOTE — Patient Instructions (Signed)
Thank you for coming in today. Call or go to the ER if you develop a large red swollen joint with extreme pain or oozing puss.  Return in 1 month of not better.   Arthritis Arthritis is a term that is commonly used to refer to joint pain or joint disease. There are more than 100 types of arthritis. CAUSES The most common cause of this condition is wear and tear of a joint. Other causes include:  Gout.  Inflammation of a joint.  An infection of a joint.  Sprains and other injuries near the joint.  A drug reaction or allergic reaction. In some cases, the cause may not be known. SYMPTOMS The main symptom of this condition is pain in the joint with movement. Other symptoms include:  Redness, swelling, or stiffness at a joint.  Warmth coming from the joint.  Fever.  Overall feeling of illness. DIAGNOSIS This condition may be diagnosed with a physical exam and tests, including:  Blood tests.  Urine tests.  Imaging tests, such as MRI, X-rays, or a CT scan. Sometimes, fluid is removed from a joint for testing. TREATMENT Treatment for this condition may involve:  Treatment of the cause, if it is known.  Rest.  Raising (elevating) the joint.  Applying cold or hot packs to the joint.  Medicines to improve symptoms and reduce inflammation.  Injections of a steroid such as cortisone into the joint to help reduce pain and inflammation. Depending on the cause of your arthritis, you may need to make lifestyle changes to reduce stress on your joint. These changes may include exercising more and losing weight. HOME CARE INSTRUCTIONS Medicines  Take over-the-counter and prescription medicines only as told by your health care provider.  Do not take aspirin to relieve pain if gout is suspected. Activities  Rest your joint if told by your health care provider. Rest is important when your disease is active and your joint feels painful, swollen, or stiff.  Avoid activities that  make the pain worse. It is important to balance activity with rest.  Exercise your joint regularly with range-of-motion exercises as told by your health care provider. Try doing low-impact exercise, such as:  Swimming.  Water aerobics.  Biking.  Walking. Joint Care  If your joint is swollen, keep it elevated if told by your health care provider.  If your joint feels stiff in the morning, try taking a warm shower.  If directed, apply heat to the joint. If you have diabetes, do not apply heat without permission from your health care provider.  Put a towel between the joint and the hot pack or heating pad.  Leave the heat on the area for 20-30 minutes.  If directed, apply ice to the joint:  Put ice in a plastic bag.  Place a towel between your skin and the bag.  Leave the ice on for 20 minutes, 2-3 times per day.  Keep all follow-up visits as told by your health care provider. This is important. SEEK MEDICAL CARE IF:  The pain gets worse.  You have a fever. SEEK IMMEDIATE MEDICAL CARE IF:  You develop severe joint pain, swelling, or redness.  Many joints become painful and swollen.  You develop severe back pain.  You develop severe weakness in your leg.  You cannot control your bladder or bowels.   This information is not intended to replace advice given to you by your health care provider. Make sure you discuss any questions you have with your  health care provider.   Document Released: 05/29/2004 Document Revised: 01/10/2015 Document Reviewed: 07/17/2014 Elsevier Interactive Patient Education [employer]

## 2015-06-14 NOTE — Assessment & Plan Note (Signed)
Likely arthritis versus degenerative meniscus tear. Injections today. Plan to recheck in a few weeks. Follow-up with PCP regarding elevated blood pressure.

## 2015-06-14 NOTE — Progress Notes (Signed)
Subjective:    I'm seeing this patient as a consultation for:  Todd Mercado  CC: BL knee pain  HPI: She notes several week history of bilateral knee pain. Pain occurred after patient had stand on a slanted roofs for some time. He notes pain in the bilateral medial joint line. This was thought to be related to either degenerative meniscus tears or DJD. He has been prescribed NSAIDs which have helped a little. Pain is worse with climbing standing stepping pushing and pulling. He denies any specific injury locking catching or giving way. He feels well otherwise.  Past medical history, Surgical history, Family history not pertinant except as noted below, Social history, Allergies, and medications have been entered into the medical record, reviewed, and no changes needed.   Review of Systems: No headache, visual changes, nausea, vomiting, diarrhea, constipation, dizziness, abdominal pain, skin rash, fevers, chills, night sweats, weight loss, swollen lymph nodes, body aches, joint swelling, muscle aches, chest pain, shortness of breath, mood changes, visual or auditory hallucinations.   Objective:    Filed Vitals:   06/14/15 1539  BP: 173/81  Pulse: 71   General: Well Developed, well nourished, and in no acute distress.  Neuro/Psych: Alert and oriented x3, extra-ocular muscles intact, able to move all 4 extremities, sensation grossly intact. Skin: Warm and dry, no rashes noted.  Respiratory: Not using accessory muscles, speaking in full sentences, trachea midline.  Cardiovascular: Pulses palpable, no extremity edema. Abdomen: Does not appear distended. MSK:   Right knee Mild effusion no skin changes. Region motion 0-120 with 1+ retropatellar crepitations. Tender to palpation medial joint line nontender otherwise. Positive medial McMurray's test negative lateral. Stable ligamentous exam. Intact strength.  Left knee No effusion no skin changes. Region motion 0-120 with 1+  retropatellar crepitations. Tender to palpation medial joint line nontender otherwise. Positive medial McMurray's test negative lateral. Stable ligamentous exam. Intact strength.  Procedure: Real-time Ultrasound Guided Injection of Left Knee  Device: GE Logiq E  Images permanently stored and available for review in the ultrasound unit. Verbal informed consent obtained. Discussed risks and benefits of procedure. Warned about infection bleeding damage to structures skin hypopigmentation and fat atrophy among others. Patient expresses understanding and agreement Time-out conducted.  Noted no overlying erythema, induration, or other signs of local infection.  Skin prepped in a sterile fashion.  Local anesthesia: Topical Ethyl chloride.  With sterile technique and under real time ultrasound guidance: 80 mg of Kenalog and 4 mL of Marcaine injected easily.  Completed without difficulty  Pain immediately resolved suggesting accurate placement of the medication.  Advised to call if fevers/chills, erythema, induration, drainage, or persistent bleeding.  Images permanently stored and available for review in the ultrasound unit.  Impression: Technically successful ultrasound guided injection.   Procedure: Real-time Ultrasound Guided Injection of Right Knee  Device: GE Logiq E  Images permanently stored and available for review in the ultrasound unit. Verbal informed consent obtained. Discussed risks and benefits of procedure. Warned about infection bleeding damage to structures skin hypopigmentation and fat atrophy among others. Patient expresses understanding and agreement Time-out conducted.  Noted no overlying erythema, induration, or other signs of local infection.  Skin prepped in a sterile fashion.  Local anesthesia: Topical Ethyl chloride.  With sterile technique and under real time ultrasound guidance: 80 mg of Kenalog and 4 mL of Marcaine injected easily.  Completed  without difficulty  Pain immediately resolved suggesting accurate placement of the medication.  Advised to call if fevers/chills, erythema,  induration, drainage, or persistent bleeding.  Images permanently stored and available for review in the ultrasound unit.  Impression: Technically successful ultrasound guided injection.  No results found for this or any previous visit (from the past 24 hour(s)). No results found.  Impression and Recommendations:   This case required medical decision making of moderate complexity.

## 2015-06-22 ENCOUNTER — Other Ambulatory Visit: Payer: Self-pay | Admitting: *Deleted

## 2015-06-22 MED ORDER — MELOXICAM 7.5 MG PO TABS: ORAL_TABLET | ORAL | Status: DC

## 2015-06-22 NOTE — Telephone Encounter (Signed)
Pts mother Bonita Quin advised and voiced understanding, okay per DPR.

## 2015-06-22 NOTE — Telephone Encounter (Signed)
Unsure if this is a pt here. Was a [facility] pt.   Pt was seen at ER and given Rx for Meloxicam f/u with Dr. Clementeen Graham at [facility] but was advised to f/u with PCP for HTN.  RF request for Meloxicam LOV: 08/01/14 - URI Next ov: None Last written: 05/28/15 #30 w/ 0RF by ER doctor.   Please advise. Thanks.

## 2015-06-22 NOTE — Telephone Encounter (Signed)
I'll RF x 1 mo supply with no RF.  Pt needs to make appt for f/u HTN with me sometime in the next month.

## 2015-06-30 ENCOUNTER — Emergency Department
Admission: EM | Admit: 2015-06-30 | Discharge: 2015-06-30 | Disposition: A | Discharge status: Home / Self Care | Payer: BLUE CROSS/BLUE SHIELD | Source: Home / Self Care | Attending: Family Medicine | Admitting: Family Medicine

## 2015-06-30 ENCOUNTER — Encounter: Payer: Self-pay | Admitting: Emergency Medicine

## 2015-06-30 DIAGNOSIS — J209 Acute bronchitis, unspecified: Secondary | ICD-10-CM

## 2015-06-30 DIAGNOSIS — M7022 Olecranon bursitis, left elbow: Secondary | ICD-10-CM

## 2015-06-30 MED ORDER — DOXYCYCLINE HYCLATE 100 MG PO CAPS: 100.0000 mg | ORAL_CAPSULE | Freq: Two times a day (BID) | ORAL | Status: DC

## 2015-06-30 MED ORDER — BENZONATATE 100 MG PO CAPS: 100.0000 mg | ORAL_CAPSULE | Freq: Three times a day (TID) | ORAL | Status: DC

## 2015-06-30 MED ORDER — PREDNISONE 20 MG PO TABS: ORAL_TABLET | ORAL | Status: DC

## 2015-06-30 MED ORDER — ALBUTEROL SULFATE HFA 108 (90 BASE) MCG/ACT IN AERS: 1.0000 | INHALATION_SPRAY | Freq: Four times a day (QID) | RESPIRATORY_TRACT | Status: DC | PRN

## 2015-06-30 NOTE — ED Provider Notes (Signed)
CSN: [id number]     Arrival date & time 06/30/15  1914 History   First MD Initiated Contact with Patient 06/30/15 1000     Chief Complaint  Patient presents with  . Fever  . Elbow Pain   (Consider location/radiation/quality/duration/timing/severity/associated sxs/prior Treatment) HPI The pt is a 55yo male presenting to [facility] with c/o Left elbow pain with swelling as well as 2 days of cough, congestion, and subjective fever.  He states his Left elbow started to swell the other day after taking a sign down at work.  He is worried he may have gotten a spider bite but also reports hx of gout and arthritis.  His Left elbow has swelled before but not this bad. Pain is aching and sore at this time, mild in severity, worse with palpation.  Denies direct trauma to his elbow. He also notes he has had URI symptoms and worsening cough with congestion.  He reports remote hx of asthma but states he feels his cough is getting worse and has had chest tightness due to his cold symptoms.  Denies n/v/d.   Past Medical History  Diagnosis Date  . Hypertension   . Cigar smoker motivated to quit   . H/O sleep apnea    History reviewed. No pertinent past surgical history. Family History  Problem Relation Age of Onset  . Hypertension Mother   . COPD Mother   . Emphysema Father    Social History  Substance Use Topics  . Smoking status: Heavy Tobacco Smoker -- 1.50 packs/day for 30 years    Types: Cigarettes  . Smokeless tobacco: None     Comment: discussed benefits of quitting  . Alcohol Use: Yes     Comment: 6-12 beers daily    Review of Systems  Constitutional: Positive for fever, chills and fatigue.  HENT: Positive for congestion, rhinorrhea, sinus pressure and sneezing. Negative for ear pain, sore throat, trouble swallowing and voice change.   Respiratory: Positive for cough, shortness of breath and wheezing.   Cardiovascular: Negative for chest pain and palpitations.  Gastrointestinal: Negative for  nausea, vomiting, abdominal pain and diarrhea.  Musculoskeletal: Positive for myalgias, joint swelling and arthralgias. Negative for back pain.       Body aches  Skin: Negative for color change and rash.  Neurological: Positive for light-headedness and headaches. Negative for dizziness and syncope.  All other systems reviewed and are negative.   Allergies  Review of patient's allergies indicates no known allergies.  Home Medications   Prior to Admission medications   Medication Sig Start Date End Date Taking? Authorizing Provider  acetaminophen (TYLENOL) 500 MG tablet Take 1,000 mg by mouth every 6 (six) hours as needed for moderate pain.    Historical Provider, MD  albuterol (PROVENTIL HFA;VENTOLIN HFA) 108 (90 Base) MCG/ACT inhaler Inhale 1-2 puffs into the lungs every 6 (six) hours as needed for wheezing or shortness of breath. 06/30/15   Junius Finner, PA-C  benzonatate (TESSALON) 100 MG capsule Take 1 capsule (100 mg total) by mouth every 8 (eight) hours. 06/30/15   Junius Finner, PA-C  doxycycline (VIBRAMYCIN) 100 MG capsule Take 1 capsule (100 mg total) by mouth 2 (two) times daily. One po bid x 7 days 06/30/15   Junius Finner, PA-C  lisinopril-hydrochlorothiazide (PRINZIDE,ZESTORETIC) 10-12.5 MG tablet Take 1 tablet by mouth daily. 05/28/15   Junius Finner, PA-C  meloxicam (MOBIC) 7.5 MG tablet Take 1-2 tabs daily for 5-7 days, then daily as needed for pain 06/22/15   Loistine Chance  Hoover Browns, MD  predniSONE (DELTASONE) 20 MG tablet 3 tabs po day one, then 2 po daily x 4 days 06/30/15   Junius Finner, PA-C   Meds Ordered and Administered this Visit  Medications - No data to display  BP 97/64 mmHg  Pulse 88  Temp(Src) 98.8 F (37.1 C) (Oral)  Wt 210 lb (95.255 kg)  SpO2 95% No data found.   Physical Exam  Constitutional: He appears well-developed and well-nourished.  HENT:  Head: Normocephalic and atraumatic.  Right Ear: Tympanic membrane normal.  Left Ear: Tympanic membrane normal.   Nose: Mucosal edema and rhinorrhea present.  Mouth/Throat: Uvula is midline and mucous membranes are normal. Posterior oropharyngeal erythema present. No oropharyngeal exudate, posterior oropharyngeal edema or tonsillar abscesses.  Eyes: Conjunctivae are normal. No scleral icterus.  Neck: Normal range of motion. Neck supple.  Cardiovascular: Normal rate, regular rhythm and normal heart sounds.   Pulmonary/Chest: Effort normal. No respiratory distress. He has wheezes. He has no rales. He exhibits no tenderness.  Faint diffuse inspiratory wheeze. No rhonchi. No respiratory distress.  Abdominal: Soft. He exhibits no distension. There is no tenderness.  Musculoskeletal: Normal range of motion. He exhibits edema and tenderness.  Left elbow: moderate edema over lateral distal aspect of elbow. Tender to touch. Full ROM.   Neurological: He is alert.  Skin: Skin is warm and dry. No erythema.  Left elbow: skin in tact, mild erythema and warmth. No ecchymosis. No bleeding or discharge.   Nursing note and vitals reviewed.   ED Course  Procedures (including critical care time)  Labs Review Labs Reviewed - No data to display  Imaging Review No results found.   MDM   1. Bursitis of elbow, left   2. Acute bronchitis, unspecified organism    Pt presenting to [facility] with c/o URI symptoms for 2-3 days as well as Left elbow pain and swelling.  Subjective fever.  No evidence of septic joint.  Exam of Left elbow c/o bursitis. Due to mild erythema and reported subjective fever, will place pt on antibiotics.  Rx: doxycycline, albuterol, prednisone, and tessalon   Discussed use of elbow sleeve to help keep compression on area of pain and decrease swelling, pt states he will get one over the counter.   Pt has f/u next week scheduled with his PCP for recheck of his BP medications.  Encouraged to f/u as scheduled as well as f/u with Sports Medicine in 1-2 weeks if Left elbow pain and swelling is not  improving.     Junius Finner, PA-C 06/30/15 1113

## 2015-06-30 NOTE — Discharge Instructions (Signed)
You may take 400-600mg Ibuprofen (Motrin) every 6-8 hours for fever and pain  °Alternate with Tylenol  °You may take 500mg Tylenol every 4-6 hours as needed for fever and pain  °Follow-up with your primary care provider next week for recheck of symptoms if not improving.  °Be sure to drink plenty of fluids and rest, at least 8hrs of sleep a night, preferably more while you are sick. °Return urgent care or go to closest ER if you cannot keep down fluids/signs of dehydration, fever not reducing with Tylenol, difficulty breathing/wheezing, stiff neck, worsening condition, or other concerns (see below)  °Please take antibiotics as prescribed and be sure to complete entire course even if you start to feel better to ensure infection does not come back. ° ° °Acute Bronchitis °Bronchitis is when the airways that extend from the windpipe into the lungs get red, puffy, and painful (inflamed). Bronchitis often causes thick spit (mucus) to develop. This leads to a cough. A cough is the most common symptom of bronchitis. °In acute bronchitis, the condition usually begins suddenly and goes away over time (usually in 2 weeks). Smoking, allergies, and asthma can make bronchitis worse. Repeated episodes of bronchitis may cause more lung problems. °HOME CARE °· Rest. °· Drink enough fluids to keep your pee (urine) clear or pale yellow (unless you need to limit fluids as told by your doctor). °· Only take over-the-counter or prescription medicines as told by your doctor. °· Avoid smoking and secondhand smoke. These can make bronchitis worse. If you are a smoker, think about using nicotine gum or skin patches. Quitting smoking will help your lungs heal faster. °· Reduce the chance of getting bronchitis again by: °¨ Washing your hands often. °¨ Avoiding people with cold symptoms. °¨ Trying not to touch your hands to your mouth, nose, or eyes. °· Follow up with your doctor as told. °GET HELP IF: °Your symptoms do not improve after 1 week  of treatment. Symptoms include: °· Cough. °· Fever. °· Coughing up thick spit. °· Body aches. °· Chest congestion. °· Chills. °· Shortness of breath. °· Sore throat. °GET HELP RIGHT AWAY IF:  °· You have an increased fever. °· You have chills. °· You have severe shortness of breath. °· You have bloody thick spit (sputum). °· You throw up (vomit) often. °· You lose too much body fluid (dehydration). °· You have a severe headache. °· You faint. °MAKE SURE YOU:  °· Understand these instructions. °· Will watch your condition. °· Will get help right away if you are not doing well or get worse. °  °This information is not intended to replace advice given to you by your health care provider. Make sure you discuss any questions you have with your health care provider. °  °Document Released: 10/08/2007 Document Revised: 12/22/2012 Document Reviewed: 10/12/2012 °Elsevier Interactive Patient Education ©2016 [employer] ° °

## 2015-06-30 NOTE — ED Notes (Signed)
Pt c/o left elbow pain, fever and cough x2 days. Elbow is swollen and pt unsure if maybe a spider bite.

## 2015-07-02 ENCOUNTER — Encounter: Payer: Self-pay | Admitting: Family Medicine

## 2015-07-02 ENCOUNTER — Ambulatory Visit (INDEPENDENT_AMBULATORY_CARE_PROVIDER_SITE_OTHER): Payer: BLUE CROSS/BLUE SHIELD | Admitting: Family Medicine

## 2015-07-02 VITALS — BP 142/94 | HR 68 | Temp 98.3°F | Resp 20 | Wt 212.0 lb

## 2015-07-02 DIAGNOSIS — I10 Essential (primary) hypertension: Secondary | ICD-10-CM | POA: Diagnosis not present

## 2015-07-02 MED ORDER — LISINOPRIL 5 MG PO TABS: 5.0000 mg | ORAL_TABLET | Freq: Every day | ORAL | Status: DC

## 2015-07-02 NOTE — Progress Notes (Signed)
Patient ID: Todd Mercado, male   DOB: [birth year], 55 y.o.   MRN: [id number]    Todd Mercado , 04-Dec-1960, 55 y.o., male MRN: [id number]  CC: Hypertension Subjective: Pt presents for an acute OV with complaints of recent bronchitis and unstable BP.  Associated symptoms include dizziness, feeling like he is going to black out.  Patient has had a history of hypertension and lisinopril 20 mg Qd prescribed in the past. He was prescribed this medicine in 2015, he however stopped this medicine because he did not have insurance. On return visit his BP was borderline without medications, so they stopped the medicine. He states he was told to watch his diet, exercise ans top smoking. He admits he has not changed his diet or exercise. He continues to smoke. He reports taking his BP at home and it has always been borderline 145's/90's.  He went to UC in [place] secondary to increased pain on 05/28/15 and  his BP was 170/120 range,  they placed him on a medication. He was seen in ED 06/06/2015 for elevated BP, after start of medication, secondary to increased BP at home 197/119, without symptoms. EKG NSR, CXR unremarkable, given albuterol inhaler for cough. He was seen again in the ED 06/30/2015 for bursitis and bronchitis. Treated with abx and prednisone. His BP at that time was 97/64 and he was getting dizzy and had feelings of passing out. He reports diarrhea and feeling weak during that time. He continued to take ACE/HCTZ combo through his illness. He last took his BP medication 2 days ago.   No Known Allergies Social History  Substance Use Topics  . Smoking status: Heavy Tobacco Smoker -- 1.50 packs/day for 30 years    Types: Cigarettes  . Smokeless tobacco: Not on file     Comment: discussed benefits of quitting  . Alcohol Use: Yes     Comment: 6-12 beers daily   Past Medical History  Diagnosis Date  . Hypertension   . Cigar smoker motivated to quit   . H/O sleep apnea   . Arthritis    Past  Surgical History  Procedure Laterality Date  . Dental surgery     Family History  Problem Relation Age of Onset  . Hypertension Mother   . COPD Mother   . Emphysema Father      Medication List       This list is accurate as of: 07/02/15 11:08 AM.  Always use your most recent med list.               acetaminophen 500 MG tablet  Commonly known as:  TYLENOL  Take 1,000 mg by mouth every 6 (six) hours as needed for moderate pain.     albuterol 108 (90 Base) MCG/ACT inhaler  Commonly known as:  PROVENTIL HFA;VENTOLIN HFA  Inhale 1-2 puffs into the lungs every 6 (six) hours as needed for wheezing or shortness of breath.     benzonatate 100 MG capsule  Commonly known as:  TESSALON  Take 1 capsule (100 mg total) by mouth every 8 (eight) hours.     doxycycline 100 MG capsule  Commonly known as:  VIBRAMYCIN  Take 1 capsule (100 mg total) by mouth 2 (two) times daily. One po bid x 7 days     lisinopril-hydrochlorothiazide 10-12.5 MG tablet  Commonly known as:  PRINZIDE,ZESTORETIC  Take 1 tablet by mouth daily.     meloxicam 7.5 MG tablet  Commonly known as:  MOBIC  Take 1-2  tabs daily for 5-7 days, then daily as needed for pain     predniSONE 20 MG tablet  Commonly known as:  DELTASONE  3 tabs po day one, then 2 po daily x 4 days       ROS: Negative, with the exception of above mentioned in HPI  Objective:  BP 142/94 mmHg  Pulse 68  Temp(Src) 98.3 F (36.8 C)  Resp 20  Wt 212 lb (96.163 kg)  SpO2 95% Body mass index is 28.75 kg/(m^2). Gen: Afebrile. No acute distress. Nontoxic in appearance. cough present. HENT: AT. [place]. MMM, no oral lesions.  Eyes:Pupils Equal Round Reactive to light, Extraocular movements intact,  Conjunctiva without redness, discharge or icterus. CV: RRR, no edema Chest: CTAB, no wheeze or crackles. Good air movement, normal resp effort.  Abd: Soft. NTND. BS present Neuro: Normal gait. PERLA. EOMi. Alert. Oriented x3    Assessment/Plan: Todd Mercado is a 55 y.o. male present for acute OV for  1. Essential hypertension, benign - DC prior HTN meds. Pt advised he needs consistent care in order to get his BP under control. His lower end BP last week was likely secondary to illness and dehydration (diarrhea, HCTZ, no appetite etc). It also sounds as if he has HTN crisis BP ranges in the past.  - Low salt diet encouraged, AVS on diet and hypertension provided to pt today.  - Start 5 mg Lisinopril today and monitor at home. If consistently above 140/90, will need to be seen sooner.  - Nurse visit 1 week. Then if stable will need to follow up in 3 months.  - needs to stop smoking, needs to be seen for CPE with labs.    electronically signed by:  Felix Pacini, DO  [facility] [place]

## 2015-07-02 NOTE — Patient Instructions (Signed)
Hypertension Hypertension, commonly called high blood pressure, is when the force of blood pumping through your arteries is too strong. Your arteries are the blood vessels that carry blood from your heart throughout your body. A blood pressure reading consists of a higher number over a lower number, such as 110/72. The higher number (systolic) is the pressure inside your arteries when your heart pumps. The lower number (diastolic) is the pressure inside your arteries when your heart relaxes. Ideally you want your blood pressure below 120/80. Hypertension forces your heart to work harder to pump blood. Your arteries may become narrow or stiff. Having untreated or uncontrolled hypertension can cause heart attack, stroke, kidney disease, and other problems. RISK FACTORS Some risk factors for high blood pressure are controllable. Others are not.  Risk factors you cannot control include:   Race. You may be at higher risk if you are African American.  Age. Risk increases with age.  Gender. Men are at higher risk than women before age 45 years. After age 65, women are at higher risk than men. Risk factors you can control include:  Not getting enough exercise or physical activity.  Being overweight.  Getting too much fat, sugar, calories, or salt in your diet.  Drinking too much alcohol. SIGNS AND SYMPTOMS Hypertension does not usually cause signs or symptoms. Extremely high blood pressure (hypertensive crisis) may cause headache, anxiety, shortness of breath, and nosebleed. DIAGNOSIS To check if you have hypertension, your health care provider will measure your blood pressure while you are seated, with your arm held at the level of your heart. It should be measured at least twice using the same arm. Certain conditions can cause a difference in blood pressure between your right and left arms. A blood pressure reading that is higher than normal on one occasion does not mean that you need treatment. If  it is not clear whether you have high blood pressure, you may be asked to return on a different day to have your blood pressure checked again. Or, you may be asked to monitor your blood pressure at home for 1 or more weeks. TREATMENT Treating high blood pressure includes making lifestyle changes and possibly taking medicine. Living a healthy lifestyle can help lower high blood pressure. You may need to change some of your habits. Lifestyle changes may include:  Following the DASH diet. This diet is high in fruits, vegetables, and whole grains. It is low in salt, red meat, and added sugars.  Keep your sodium intake below 2,300 mg per day.  Getting at least 30-45 minutes of aerobic exercise at least 4 times per week.  Losing weight if necessary.  Not smoking.  Limiting alcoholic beverages.  Learning ways to reduce stress. Your health care provider may prescribe medicine if lifestyle changes are not enough to get your blood pressure under control, and if one of the following is true:  You are 18-59 years of age and your systolic blood pressure is above 140.  You are 60 years of age or older, and your systolic blood pressure is above 150.  Your diastolic blood pressure is above 90.  You have diabetes, and your systolic blood pressure is over 140 or your diastolic blood pressure is over 90.  You have kidney disease and your blood pressure is above 140/90.  You have heart disease and your blood pressure is above 140/90. Your personal target blood pressure may vary depending on your medical conditions, your age, and other factors. HOME CARE INSTRUCTIONS    Have your blood pressure rechecked as directed by your health care provider.   Take medicines only as directed by your health care provider. Follow the directions carefully. Blood pressure medicines must be taken as prescribed. The medicine does not work as well when you skip doses. Skipping doses also puts you at risk for  problems.  Do not smoke.   Monitor your blood pressure at home as directed by your health care provider. SEEK MEDICAL CARE IF:   You think you are having a reaction to medicines taken.  You have recurrent headaches or feel dizzy.  You have swelling in your ankles.  You have trouble with your vision. SEEK IMMEDIATE MEDICAL CARE IF:  You develop a severe headache or confusion.  You have unusual weakness, numbness, or feel faint.  You have severe chest or abdominal pain.  You vomit repeatedly.  You have trouble breathing. MAKE SURE YOU:   Understand these instructions.  Will watch your condition.  Will get help right away if you are not doing well or get worse.   This information is not intended to replace advice given to you by your health care provider. Make sure you discuss any questions you have with your health care provider.   Document Released: 04/21/2005 Document Revised: 09/05/2014 Document Reviewed: 02/11/2013 Elsevier Interactive Patient Education 2016 Elsevier Inc.  Low-Sodium Eating Plan Sodium raises blood pressure and causes water to be held in the body. Getting less sodium from food will help lower your blood pressure, reduce any swelling, and protect your heart, liver, and kidneys. We get sodium by adding salt (sodium chloride) to food. Most of our sodium comes from canned, boxed, and frozen foods. Restaurant foods, fast foods, and pizza are also very high in sodium. Even if you take medicine to lower your blood pressure or to reduce fluid in your body, getting less sodium from your food is important. WHAT IS MY PLAN? Most people should limit their sodium intake to 2,300 mg a day. Your health care provider recommends that you limit your sodium intake to __________ a day.  WHAT DO I NEED TO KNOW ABOUT THIS EATING PLAN? For the low-sodium eating plan, you will follow these general guidelines:  Choose foods with a % Daily Value for sodium of less than 5% (as  listed on the food label).   Use salt-free seasonings or herbs instead of table salt or sea salt.   Check with your health care provider or pharmacist before using salt substitutes.   Eat fresh foods.  Eat more vegetables and fruits.  Limit canned vegetables. If you do use them, rinse them well to decrease the sodium.   Limit cheese to 1 oz (28 g) per day.   Eat lower-sodium products, often labeled as "lower sodium" or "no salt added."  Avoid foods that contain monosodium glutamate (MSG). MSG is sometimes added to [place] food and some canned foods.  Check food labels (Nutrition Facts labels) on foods to learn how much sodium is in one serving.  Eat more home-cooked food and less restaurant, buffet, and fast food.  When eating at a restaurant, ask that your food be prepared with less salt, or no salt if possible.  HOW DO I READ FOOD LABELS FOR SODIUM INFORMATION? The Nutrition Facts label lists the amount of sodium in one serving of the food. If you eat more than one serving, you must multiply the listed amount of sodium by the number of servings. Food labels may also identify foods  as:  Sodium free--Less than 5 mg in a serving.  Very low sodium--35 mg or less in a serving.  Low sodium--140 mg or less in a serving.  Light in sodium--50% less sodium in a serving. For example, if a food that usually has 300 mg of sodium is changed to become light in sodium, it will have 150 mg of sodium.  Reduced sodium--25% less sodium in a serving. For example, if a food that usually has 400 mg of sodium is changed to reduced sodium, it will have 300 mg of sodium. WHAT FOODS CAN I EAT? Grains Low-sodium cereals, including oats, puffed wheat and rice, and shredded wheat cereals. Low-sodium crackers. Unsalted rice and pasta. Lower-sodium bread.  Vegetables Frozen or fresh vegetables. Low-sodium or reduced-sodium canned vegetables. Low-sodium or reduced-sodium tomato sauce and paste.  Low-sodium or reduced-sodium tomato and vegetable juices.  Fruits Fresh, frozen, and canned fruit. Fruit juice.  Meat and Other Protein Products Low-sodium canned tuna and salmon. Fresh or frozen meat, poultry, seafood, and fish. Lamb. Unsalted nuts. Dried beans, peas, and lentils without added salt. Unsalted canned beans. Homemade soups without salt. Eggs.  Dairy Milk. Soy milk. Ricotta cheese. Low-sodium or reduced-sodium cheeses. Yogurt.  Condiments Fresh and dried herbs and spices. Salt-free seasonings. Onion and garlic powders. Low-sodium varieties of mustard and ketchup. Fresh or refrigerated horseradish. Lemon juice.  Fats and Oils Reduced-sodium salad dressings. Unsalted butter.  Other Unsalted popcorn and pretzels.  The items listed above may not be a complete list of recommended foods or beverages. Contact your dietitian for more options. WHAT FOODS ARE NOT RECOMMENDED? Grains Instant hot cereals. Bread stuffing, pancake, and biscuit mixes. Croutons. Seasoned rice or pasta mixes. Noodle soup cups. Boxed or frozen macaroni and cheese. Self-rising flour. Regular salted crackers. Vegetables Regular canned vegetables. Regular canned tomato sauce and paste. Regular tomato and vegetable juices. Frozen vegetables in sauces. Salted [place] fries. Olives. Rosita Fire. Relishes. Sauerkraut. Salsa. Meat and Other Protein Products Salted, canned, smoked, spiced, or pickled meats, seafood, or fish. Bacon, ham, sausage, hot dogs, corned beef, chipped beef, and packaged luncheon meats. Salt pork. Jerky. Pickled herring. Anchovies, regular canned tuna, and sardines. Salted nuts. Dairy Processed cheese and cheese spreads. Cheese curds. Blue cheese and cottage cheese. Buttermilk.  Condiments Onion and garlic salt, seasoned salt, table salt, and sea salt. Canned and packaged gravies. Worcestershire sauce. Tartar sauce. Barbecue sauce. Teriyaki sauce. Soy sauce, including reduced sodium. Steak  sauce. Fish sauce. Oyster sauce. Cocktail sauce. Horseradish that you find on the shelf. Regular ketchup and mustard. Meat flavorings and tenderizers. Bouillon cubes. Hot sauce. Tabasco sauce. Marinades. Taco seasonings. Relishes. Fats and Oils Regular salad dressings. Salted butter. Margarine. Ghee. Bacon fat.  Other Potato and tortilla chips. Corn chips and puffs. Salted popcorn and pretzels. Canned or dried soups. Pizza. Frozen entrees and pot pies.  The items listed above may not be a complete list of foods and beverages to avoid. Contact your dietitian for more information.   This information is not intended to replace advice given to you by your health care provider. Make sure you discuss any questions you have with your health care provider.   Document Released: 10/11/2001 Document Revised: 05/12/2014 Document Reviewed: 02/23/2013 Elsevier Interactive Patient Education 2016 Elsevier Inc.  Start Lisinopril 5 mg daily, take BP at least 2 hours after starting medicine and if continues to be above 140/90 will need to see sooner.  Nurse visit 1 week for BP re-check. Then if controlled on medicine will  need to see in 3 months.

## 2015-07-09 ENCOUNTER — Ambulatory Visit (INDEPENDENT_AMBULATORY_CARE_PROVIDER_SITE_OTHER): Payer: BLUE CROSS/BLUE SHIELD | Admitting: *Deleted

## 2015-07-09 VITALS — BP 142/83 | HR 84

## 2015-07-09 DIAGNOSIS — I10 Essential (primary) hypertension: Secondary | ICD-10-CM | POA: Diagnosis not present

## 2015-07-09 NOTE — Progress Notes (Signed)
Patient ID: Todd Mercado, male   DOB: 06-01-1960, 55 y.o.   MRN: [id number] Patient her for blood pressure check per Dr. Claiborne BillingsKuneff

## 2015-07-09 NOTE — Progress Notes (Signed)
Patient ID: Todd Mercado, male   DOB: April 04, 1961, 55 y.o.   MRN: [id number] BP controlled on current regimen. Continue Lisinopril 5 mg Qd and low salt diet.  - F/U 3 months

## 2015-08-06 ENCOUNTER — Other Ambulatory Visit: Payer: Self-pay | Admitting: *Deleted

## 2015-08-06 MED ORDER — LISINOPRIL 5 MG PO TABS: 5.0000 mg | ORAL_TABLET | Freq: Every day | ORAL | Status: DC

## 2015-10-04 ENCOUNTER — Encounter: Payer: Self-pay | Admitting: Family Medicine

## 2015-10-04 ENCOUNTER — Ambulatory Visit (INDEPENDENT_AMBULATORY_CARE_PROVIDER_SITE_OTHER): Payer: BLUE CROSS/BLUE SHIELD | Admitting: Family Medicine

## 2015-10-04 ENCOUNTER — Ambulatory Visit (INDEPENDENT_AMBULATORY_CARE_PROVIDER_SITE_OTHER): Payer: BLUE CROSS/BLUE SHIELD

## 2015-10-04 VITALS — BP 129/84 | HR 76 | Wt 215.0 lb

## 2015-10-04 DIAGNOSIS — M25561 Pain in right knee: Secondary | ICD-10-CM | POA: Insufficient documentation

## 2015-10-04 DIAGNOSIS — M170 Bilateral primary osteoarthritis of knee: Secondary | ICD-10-CM | POA: Diagnosis not present

## 2015-10-04 NOTE — Patient Instructions (Signed)
Thank you for coming in today. Get xray of your knee today on your way out.  Return as needed or in about 2 weeks if not better.   Arthritis Arthritis is a term that is commonly used to refer to joint pain or joint disease. There are more than 100 types of arthritis. CAUSES The most common cause of this condition is wear and tear of a joint. Other causes include:  Gout.  Inflammation of a joint.  An infection of a joint.  Sprains and other injuries near the joint.  A drug reaction or allergic reaction. In some cases, the cause may not be known. SYMPTOMS The main symptom of this condition is pain in the joint with movement. Other symptoms include:  Redness, swelling, or stiffness at a joint.  Warmth coming from the joint.  Fever.  Overall feeling of illness. DIAGNOSIS This condition may be diagnosed with a physical exam and tests, including:  Blood tests.  Urine tests.  Imaging tests, such as MRI, X-rays, or a CT scan. Sometimes, fluid is removed from a joint for testing. TREATMENT Treatment for this condition may involve:  Treatment of the cause, if it is known.  Rest.  Raising (elevating) the joint.  Applying cold or hot packs to the joint.  Medicines to improve symptoms and reduce inflammation.  Injections of a steroid such as cortisone into the joint to help reduce pain and inflammation. Depending on the cause of your arthritis, you may need to make lifestyle changes to reduce stress on your joint. These changes may include exercising more and losing weight. HOME CARE INSTRUCTIONS Medicines  Take over-the-counter and prescription medicines only as told by your health care provider.  Do not take aspirin to relieve pain if gout is suspected. Activities  Rest your joint if told by your health care provider. Rest is important when your disease is active and your joint feels painful, swollen, or stiff.  Avoid activities that make the pain worse. It is  important to balance activity with rest.  Exercise your joint regularly with range-of-motion exercises as told by your health care provider. Try doing low-impact exercise, such as:  Swimming.  Water aerobics.  Biking.  Walking. Joint Care  If your joint is swollen, keep it elevated if told by your health care provider.  If your joint feels stiff in the morning, try taking a warm shower.  If directed, apply heat to the joint. If you have diabetes, do not apply heat without permission from your health care provider.  Put a towel between the joint and the hot pack or heating pad.  Leave the heat on the area for 20-30 minutes.  If directed, apply ice to the joint:  Put ice in a plastic bag.  Place a towel between your skin and the bag.  Leave the ice on for 20 minutes, 2-3 times per day.  Keep all follow-up visits as told by your health care provider. This is important. SEEK MEDICAL CARE IF:  The pain gets worse.  You have a fever. SEEK IMMEDIATE MEDICAL CARE IF:  You develop severe joint pain, swelling, or redness.  Many joints become painful and swollen.  You develop severe back pain.  You develop severe weakness in your leg.  You cannot control your bladder or bowels.   This information is not intended to replace advice given to you by your health care provider. Make sure you discuss any questions you have with your health care provider.   Document  Released: 05/29/2004 Document Revised: 01/10/2015 Document Reviewed: 07/17/2014 Elsevier Interactive Patient Education [employer]2016 Elsevier Inc.

## 2015-10-05 NOTE — Progress Notes (Signed)
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 If you do not get better after this injection I recommend we do an MRI to further evaluate why you're having such bad knee pain. ______

## 2015-10-05 NOTE — Progress Notes (Signed)
Todd Mercado is a 55 y.o. male who [facility] today for right knee pain. Patient was seen 3-4 months ago for bilateral knee pain. This was thought to be due to DJD. He received steroid injections which helped until recently. His right knee became painful again a few weeks ago.It worsened about a week ago when he knelt down to pick an object up. He notes pain in the anterior aspect of the knee assisted with some clicking.He denies any injury. He does not swelling. No radiating pain weakness or numbness.    Past Medical History  Diagnosis Date  . Hypertension   . Cigar smoker motivated to quit   . H/O sleep apnea   . Arthritis    Past Surgical History  Procedure Laterality Date  . Dental surgery     Social History  Substance Use Topics  . Smoking status: Heavy Tobacco Smoker -- 1.50 packs/day for 30 years    Types: Cigarettes  . Smokeless tobacco: Not on file     Comment: discussed benefits of quitting  . Alcohol Use: Yes     Comment: 6-12 beers daily   family history includes COPD in his mother; Emphysema in his father; Hypertension in his mother.  ROS as above:  Medications: Current Outpatient Prescriptions  Medication Sig Dispense Refill  . acetaminophen (TYLENOL) 500 MG tablet Take 1,000 mg by mouth every 6 (six) hours as needed for moderate pain.    Marland Kitchen. lisinopril (PRINIVIL,ZESTRIL) 5 MG tablet Take 1 tablet (5 mg total) by mouth daily. 30 tablet 5   No current facility-administered medications for this visit.   No Known Allergies   Exam:  BP 129/84 mmHg  Pulse 76  Wt 215 lb (97.523 kg) Gen: Well NAD Right knee: Moderate effusion present. Normal leg motion. Stable ligamentous exam. Negative McMurray's test.  Procedure: Real-time Ultrasound Guided Injection of Right Knee  Device: GE Logiq E  Images permanently stored and available  for review in the ultrasound unit. Verbal informed consent obtained. Discussed risks and benefits of procedure. Warned about infection bleeding damage to structures skin hypopigmentation and fat atrophy among others. Patient expresses understanding and agreement Time-out conducted.  Noted no overlying erythema, induration, or other signs of local infection.  Skin prepped in a sterile fashion.  Local anesthesia: Topical Ethyl chloride.  With sterile technique and under real time ultrasound guidance: 80mg  kenalog and 4ml marcaine injected easily.  Completed without difficulty  Pain immediately resolved suggesting accurate placement of the medication.  Advised to call if fevers/chills, erythema, induration, drainage, or persistent bleeding.  Images permanently stored and available for review in the ultrasound unit.  Impression: Technically successful ultrasound guided injection.    No results found for this or any previous visit (from the past 24 hour(s)). Dg Knee 1-2 Views Left  10/04/2015  CLINICAL DATA:  Right knee pain and swelling for 2 weeks. No known injury. Initial encounter. EXAM: LEFT KNEE - 1-2 VIEW; RIGHT KNEE - COMPLETE 4+ VIEW COMPARISON:  None. FINDINGS: No acute bony or joint abnormality is seen on the right or left. Mild medial compartment joint space narrowing is identified bilaterally. The right patellar tendon appears thickened. Mild spurring at the quadriceps tendon insertion on the right patella is noted. IMPRESSION: No acute abnormality. Thickened appearance of the right patellar tendon may be due to tendinopathy. 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 Electronically Signed   By: Maisie Fushomas  Dalessio M.D.   On: 10/04/2015 19:10   Dg Knee Complete 4 Views Right  10/04/2015  CLINICAL DATA:  Right knee pain and swelling for 2 weeks. No known injury. Initial encounter. EXAM: LEFT KNEE - 1-2 VIEW; RIGHT KNEE - COMPLETE 4+ VIEW COMPARISON:   None. FINDINGS: No acute bony or joint abnormality is seen on the right or left. Mild medial compartment joint space narrowing is identified bilaterally. The right patellar tendon appears thickened. Mild spurring at the quadriceps tendon insertion on the right patella is noted. IMPRESSION: No acute abnormality. Thickened appearance of the right patellar tendon may be due to tendinopathy. 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 Electronically Signed   By: Drusilla Kanner M.D.   On: 10/04/2015 19:10      Assessment and Plan: 55 y.o. male with Right knee pain likely to do DJD or degenerative meniscus tear.  Plan for Xray today as well as steroid injection. If not better will obtain MRI.   Discussed warning signs or symptoms. Please see discharge instructions. Patient expresses understanding.

## 2015-10-30 ENCOUNTER — Encounter: Payer: Self-pay | Admitting: Family Medicine

## 2015-10-30 ENCOUNTER — Ambulatory Visit (INDEPENDENT_AMBULATORY_CARE_PROVIDER_SITE_OTHER): Payer: BLUE CROSS/BLUE SHIELD | Admitting: Family Medicine

## 2015-10-30 VITALS — BP 144/89 | HR 71 | Wt 221.0 lb

## 2015-10-30 DIAGNOSIS — M25561 Pain in right knee: Secondary | ICD-10-CM

## 2015-10-30 DIAGNOSIS — M25461 Effusion, right knee: Secondary | ICD-10-CM | POA: Diagnosis not present

## 2015-10-30 MED ORDER — MELOXICAM 15 MG PO TABS: 15.0000 mg | ORAL_TABLET | Freq: Every day | ORAL | Status: DC

## 2015-10-30 NOTE — Patient Instructions (Signed)
Thank you for coming in today. We will contact you with results.  Get MRI and return after MRI to go over results.   Knee Effusion Knee effusion means that you have excess fluid in your knee joint. This can cause pain and swelling in your knee. This may make your knee more difficult to bend and move. That is because there is increased pain and pressure in the joint. If there is fluid in your knee, it often means that something is wrong inside your knee, such as severe arthritis, abnormal inflammation, or an infection. Another common cause of knee effusion is an injury to the knee muscles, ligaments, or cartilage. HOME CARE INSTRUCTIONS  Use crutches as directed by your health care provider.  Wear a knee brace as directed by your health care provider.  Apply ice to the swollen area:  Put ice in a plastic bag.  Place a towel between your skin and the bag.  Leave the ice on for 20 minutes, 2-3 times per day.  Keep your knee raised (elevated) when you are sitting or lying down.  Take medicines only as directed by your health care provider.  Do any rehabilitation or strengthening exercises as directed by your health care provider.  Rest your knee as directed by your health care provider. You may start doing your normal activities again when your health care provider approves.   Keep all follow-up visits as directed by your health care provider. This is important. SEEK MEDICAL CARE IF:  You have ongoing (persistent) pain in your knee. SEEK IMMEDIATE MEDICAL CARE IF:  You have increased swelling or redness of your knee.  You have severe pain in your knee.  You have a fever.   This information is not intended to replace advice given to you by your health care provider. Make sure you discuss any questions you have with your health care provider.   Document Released: 07/12/2003 Document Revised: 05/12/2014 Document Reviewed: 12/05/2013 Elsevier Interactive Patient Education [id number]  Elsevier Inc.

## 2015-10-30 NOTE — Progress Notes (Signed)
Todd Mercado is a 55 y.o. male who [facility] Medicine today for right knee pain. Patient was seen earlier this month for continued right knee pain. He received a steroid injection which really didn't help. His x-ray at the time did not show a lot of arthritis. He notes worsening knee pain with swelling. Pain is worse with work and better with rest. He's takes 4000 mg of Tylenol daily which really controls his pain. Pain is located medially.   Past Medical History  Diagnosis Date  . Hypertension   . Cigar smoker motivated to quit   . H/O sleep apnea   . Arthritis    Past Surgical History  Procedure Laterality Date  . Dental surgery     Social History  Substance Use Topics  . Smoking status: Heavy Tobacco Smoker -- 1.50 packs/day for 30 years    Types: Cigarettes  . Smokeless tobacco: Not on file     Comment: discussed benefits of quitting  . Alcohol Use: Yes     Comment: 6-12 beers daily   family history includes COPD in his mother; Emphysema in his father; Hypertension in his mother.  ROS as above:  Medications: Current Outpatient Prescriptions  Medication Sig Dispense Refill  . acetaminophen (TYLENOL) 500 MG tablet Take 1,000 mg by mouth every 6 (six) hours as needed for moderate pain.    Marland Kitchen. lisinopril (PRINIVIL,ZESTRIL) 5 MG tablet Take 1 tablet (5 mg total) by mouth daily. 30 tablet 5  . meloxicam (MOBIC) 15 MG tablet Take 1 tablet (15 mg total) by mouth daily. 30 tablet 0   No current facility-administered medications for this visit.   No Known Allergies   Exam:  BP 144/89 mmHg  Pulse 71  Wt 221 lb (100.245 kg) Gen: Well NAD Right knee moderate effusion. Nontender normal motion no erythema.  Procedure: Real-time Ultrasound Guided aspiration and injection of right knee  Device: GE Logiq E  Images permanently stored and available for  review in the ultrasound unit. Verbal informed consent obtained. Discussed risks and benefits of procedure. Warned about infection bleeding damage to structures skin hypopigmentation and fat atrophy among others. Patient expresses understanding and agreement Time-out conducted.  Noted no overlying erythema, induration, or other signs of local infection.  Skin prepped in a sterile fashion.  Local anesthesia: Topical Ethyl chloride.  With sterile technique and under real time ultrasound guidance:18-gauge needle passed into the lateral patellar space into the joint capsule. 45 mL of yellowish clear fluid were removed. The needle was left in place while the syringe was exchanged and 80 mg of Kenalog and 4 mL of Marcaine were injected easily.  Completed without difficulty  Pain immediately resolved suggesting accurate placement of the medication.  Advised to call if fevers/chills, erythema, induration, drainage, or persistent bleeding.  Images permanently stored and available for review in the ultrasound unit.  Impression: Technically successful ultrasound guided injection.    No results found for this or any previous visit (from the past 24 hour(s)). No results found.    Assessment and Plan: 55 y.o. male with  Right knee pain and effusion. Patient has all the symptoms of significant DJD however his knee x-ray did not show a lot of arthritis. I'm suspicious for soft tissue or cartilage issue that's not visible on x-ray as the cause of his pain. Additionally gout or rheumatologic problem may also be an issue. Joint fluid was obtained today and will  be sent for culture cell count differential and crystal analysis. Additionally an MRI was ordered which certainly will help with the diagnosis of a possible OCD lesion versus meniscus tear versus ligament injury.  Return following MRI. Mobic Prescribed   Discussed warning signs or symptoms. Please see discharge instructions. Patient  expresses understanding.   .CC: Felix Pacinienee Kuneff, DO

## 2015-10-31 LAB — SYNOVIAL CELL COUNT + DIFF, W/ CRYSTALS
BASOPHILS, %: 0 %
EOSINOPHILS-SYNOVIAL: 0 % (ref 0–2)
Lymphocytes-Synovial Fld: 27 % (ref 0–74)
MONOCYTE/MACROPHAGE: 62 % (ref 0–69)
NEUTROPHIL, SYNOVIAL: 11 % (ref 0–24)
Synoviocytes, %: 0 % (ref 0–15)
WBC, SYNOVIAL: 110 {cells}/uL (ref <150)

## 2015-11-03 LAB — BODY FLUID CULTURE
GRAM STAIN: NONE SEEN
Organism ID, Bacteria: NO GROWTH

## 2015-11-03 NOTE — Progress Notes (Signed)
Quick Note:  Culture is negative so far. ______

## 2015-11-05 ENCOUNTER — Ambulatory Visit (INDEPENDENT_AMBULATORY_CARE_PROVIDER_SITE_OTHER): Payer: BLUE CROSS/BLUE SHIELD

## 2015-11-05 DIAGNOSIS — M23221 Derangement of posterior horn of medial meniscus due to old tear or injury, right knee: Secondary | ICD-10-CM

## 2015-11-05 DIAGNOSIS — M25561 Pain in right knee: Secondary | ICD-10-CM

## 2015-11-05 DIAGNOSIS — M659 Synovitis and tenosynovitis, unspecified: Secondary | ICD-10-CM | POA: Diagnosis not present

## 2015-11-05 DIAGNOSIS — M25461 Effusion, right knee: Secondary | ICD-10-CM

## 2015-11-09 NOTE — Progress Notes (Signed)
Quick Note:  Meniscus tear present. Return to clinic for detailed discussion of findings and treatment options. ______

## 2015-11-12 ENCOUNTER — Other Ambulatory Visit: Payer: BLUE CROSS/BLUE SHIELD

## 2015-11-12 ENCOUNTER — Ambulatory Visit (INDEPENDENT_AMBULATORY_CARE_PROVIDER_SITE_OTHER): Payer: BLUE CROSS/BLUE SHIELD | Admitting: Family Medicine

## 2015-11-12 ENCOUNTER — Encounter: Payer: Self-pay | Admitting: Family Medicine

## 2015-11-12 VITALS — BP 166/91 | HR 79 | Wt 216.0 lb

## 2015-11-12 DIAGNOSIS — M1711 Unilateral primary osteoarthritis, right knee: Secondary | ICD-10-CM | POA: Diagnosis not present

## 2015-11-12 DIAGNOSIS — S83241A Other tear of medial meniscus, current injury, right knee, initial encounter: Secondary | ICD-10-CM | POA: Diagnosis not present

## 2015-11-12 DIAGNOSIS — S83249A Other tear of medial meniscus, current injury, unspecified knee, initial encounter: Secondary | ICD-10-CM | POA: Insufficient documentation

## 2015-11-12 HISTORY — DX: Unilateral primary osteoarthritis, right knee: M17.11

## 2015-11-12 MED ORDER — HYDROCODONE-ACETAMINOPHEN 5-325 MG PO TABS: 1.0000 | ORAL_TABLET | Freq: Four times a day (QID) | ORAL | Status: DC | PRN

## 2015-11-12 NOTE — Progress Notes (Signed)
Todd Mercado is a 55 y.o. male who presents to [facility]: Primary Care Sports Medicine today for follow-up right knee pain. Patient has been seen several times for right knee pain and had poor response to steroid injections. He had x-ray which was largely unremarkable for DJD. He subsequently had an MRI of his right knee which is here today to review. MRI showed a medial meniscus tear but significant full-thickness medial compartment chondromalacia. He notes significant pain limiting his ability to work.   Past Medical History  Diagnosis Date  . Hypertension   . Cigar smoker motivated to quit   . H/O sleep apnea   . Arthritis    Past Surgical History  Procedure Laterality Date  . Dental surgery     Social History  Substance Use Topics  . Smoking status: Heavy Tobacco Smoker -- 1.50 packs/day for 30 years    Types: Cigarettes  . Smokeless tobacco: Not on file     Comment: discussed benefits of quitting  . Alcohol Use: Yes     Comment: 6-12 beers daily   family history includes COPD in his mother; Emphysema in his father; Hypertension in his mother.  ROS as above:  Medications: Current Outpatient Prescriptions  Medication Sig Dispense Refill  . acetaminophen (TYLENOL) 500 MG tablet Take 1,000 mg by mouth every 6 (six) hours as needed for moderate pain.    Marland Kitchen lisinopril (PRINIVIL,ZESTRIL) 5 MG tablet Take 1 tablet (5 mg total) by mouth daily. 30 tablet 5  . meloxicam (MOBIC) 15 MG tablet Take 1 tablet (15 mg total) by mouth daily. 30 tablet 0  . HYDROcodone-acetaminophen (NORCO/VICODIN) 5-325 MG tablet Take 1 tablet by mouth every 6 (six) hours as needed. 20 tablet 0   No current facility-administered medications for this visit.   No Known Allergies   Exam:  BP 166/91 mmHg  Pulse 79  Wt 216 lb (97.977 kg) Gen: Well NAD Right knee: Moderate effusion. Normal motion. Antalgic  gait.   CLINICAL DATA: Pain and swelling. No known injury.  EXAM: MRI OF THE RIGHT KNEE WITHOUT CONTRAST  TECHNIQUE: Multiplanar, multisequence MR imaging of the knee was performed. No intravenous contrast was administered.  COMPARISON: None.  FINDINGS: MENISCI  Medial meniscus: Intact.  Lateral meniscus: Complex tear of the body-posterior horn of the medial meniscus with a radial component. Small flap of meniscal tissue is noted in the superior gutter.  LIGAMENTS  Cruciates: Intact ACL and PCL.  Collaterals: Medial collateral ligament is intact. Lateral collateral ligament complex is intact.  CARTILAGE  Patellofemoral: Cartilage fissuring of the medial and lateral patellar facets.  Medial: Severe extensive full-thickness cartilage loss of the medial femoral condyle and medial tibial plateau with subchondral reactive marrow edema.  Lateral: No chondral defect.  Joint: Large joint effusion with mild synovitis. Mild edema in Hoffa's fat. No plical thickening.  Popliteal Fossa: No Baker cyst. Intact popliteus tendon.  Extensor Mechanism: Intact.  Bones: No focal marrow signal abnormality. No fracture or dislocation.  Other: None  IMPRESSION: 1. Complex tear of the body-posterior horn of the medial meniscus with a radial component. Small flap of meniscal tissue is noted in the superior gutter. 2. Severe extensive full-thickness cartilage loss of the medial femoral condyle and medial tibial plateau with subchondral reactive marrow edema. 3. Large joint effusion with mild synovitis.   Electronically Signed  By: Elige Ko  On: 11/05/2015 16:24      No results found for this or  any previous visit (from the past 24 hour(s)). No results found.    Assessment and Plan: 55 y.o. male with knee pain. I suspect the majority of the pain is due to the severe DJD/chondromalacia of the medial compartment. He certainly does  have a medial meniscus tear. I don't think unable to help him further. Plan to refer to orthopedic surgery for surgical consultation of arthroscopic medial meniscectomy versus TKR. Return as needed.  Discussed warning signs or symptoms. Please see discharge instructions. Patient expresses understanding.  CC: Felix Pacinienee Kuneff, DO

## 2015-11-12 NOTE — Patient Instructions (Signed)
Thank you for coming in today. You should hear from [facility] soon Use Norco sparingly  Total Knee Replacement Total knee replacement is a procedure to replace your knee joint with an artificial knee joint (prosthetic knee joint). The purpose of this surgery is to reduce pain and improve your knee function. LET [facility] CARE PROVIDER KNOW ABOUT:   Any allergies you have.  All medicines you are taking, including vitamins, herbs, eye drops, creams, and over-the-counter medicines.  Previous problems you or members of your family have had with the use of anesthetics.  Any blood disorders you have.  Previous surgeries you have had.  Medical conditions you have. RISKS AND COMPLICATIONS  Generally, total knee replacement is a safe procedure. However, problems can occur, including:  Loss of range of motion of the knee or instability of the knee.  Loosening of the prosthesis.  Infection.  Persistent pain. BEFORE THE PROCEDURE   Plan to have someone take you home after the procedure.  Do not eat or drink anything after midnight on the night before the procedure or as directed by your health care provider.  Ask your health care provider about:  Changing or stopping your regular medicines. This is especially important if you are taking diabetes medicines or blood thinners.  Taking medicines such as aspirin and ibuprofen. These medicines can thin your blood. Do not take these medicines before your procedure if your health care provider asks you not to.  Ask your health care provider about how your surgical site will be marked or identified.  You may be given antibiotic medicines to help prevent infection. PROCEDURE   To reduce your risk of infection:  Your health care team will wash or sanitize their hands.  Your skin will be washed with soap.  An IV tube will be inserted into one of your veins. You will be given one or more of the following:  A medicine  that makes you drowsy (sedative).  A medicine that makes you fall asleep (general anesthetic).  A medicine injected into your spine that numbs your body below the waist (spinal anesthetic).  A medicine to block feeling in your leg (nerve block) to help ease pain after surgery.  An incision will be made in your knee. Your surgeon will take out any damaged cartilage and bone by sawing off the damaged surfaces.  The surgeon will then put a new metal liner over the sawed-off portion of your thigh bone (femur) and a plastic liner over the sawed-off portion of one of the bones of your lower leg (tibia). This is to restore alignment and function to your knee. A plastic piece is often used to restore the surface of your knee cap. AFTER THE PROCEDURE   You will stay in a recovery area until your medicines wear off.  You may have drainage tubes to drain excess fluid from your knee. These tubes attach to a device that removes these fluids.  Once you are awake, stable, and taking fluids well, you will be taken to your hospital room.  You may be directed to take actions to help prevent blood clots. These may include:  Walking shortly after surgery, with someone assisting you. Moving around after surgery helps to improve blood flow.  Wearing compression stockings or using different types of devices.  You will receive physical therapy as prescribed by your health care provider.   This information is not intended to replace advice given to you by your health care provider.  Make sure you discuss any questions you have with your health care provider.   Document Released: 07/28/2000 Document Revised: 01/10/2015 Document Reviewed: 06/01/2011 Elsevier Interactive Patient Education [employer] Elsevier Inc.

## 2015-11-14 ENCOUNTER — Telehealth: Payer: Self-pay | Admitting: Family Medicine

## 2015-11-14 NOTE — Telephone Encounter (Signed)
Rescue Inhaler [facility]

## 2015-11-15 MED ORDER — ALBUTEROL SULFATE HFA 108 (90 BASE) MCG/ACT IN AERS: 2.0000 | INHALATION_SPRAY | Freq: Four times a day (QID) | RESPIRATORY_TRACT | Status: DC | PRN

## 2015-11-15 NOTE — Telephone Encounter (Signed)
Spoke with patient mother reviewed information. She will let patient know he needs an appt for CPE

## 2015-11-15 NOTE — Telephone Encounter (Signed)
Please call pt; - received a request for  rescue inhaler. History surrounding inhaler use is not clear in his history. He does not have a documented history of asthma or COPD. Ok to refill x1 but he will need appt for future refills. - He should also be encouraged to schedule a CPE

## 2016-01-09 ENCOUNTER — Other Ambulatory Visit: Payer: Self-pay | Admitting: *Deleted

## 2016-01-09 NOTE — Telephone Encounter (Signed)
Refill request for albuterol declined . Patient needs an appt and was notified in July no refills until seen. Patient has not scheduled an appt.

## 2016-01-10 ENCOUNTER — Other Ambulatory Visit: Payer: Self-pay | Admitting: *Deleted

## 2016-01-10 NOTE — Telephone Encounter (Signed)
Refill request for albuterol denied patient has been notified in July that appt necessary for refills.

## 2016-01-23 ENCOUNTER — Encounter: Payer: Self-pay | Admitting: Family Medicine

## 2016-01-23 ENCOUNTER — Ambulatory Visit (INDEPENDENT_AMBULATORY_CARE_PROVIDER_SITE_OTHER): Payer: BLUE CROSS/BLUE SHIELD | Admitting: Family Medicine

## 2016-01-23 VITALS — BP 137/85 | HR 91 | Temp 98.0°F | Resp 20 | Ht 72.0 in | Wt 216.5 lb

## 2016-01-23 DIAGNOSIS — F17210 Nicotine dependence, cigarettes, uncomplicated: Secondary | ICD-10-CM | POA: Diagnosis not present

## 2016-01-23 DIAGNOSIS — R0600 Dyspnea, unspecified: Secondary | ICD-10-CM

## 2016-01-23 DIAGNOSIS — G4733 Obstructive sleep apnea (adult) (pediatric): Secondary | ICD-10-CM

## 2016-01-23 DIAGNOSIS — I10 Essential (primary) hypertension: Secondary | ICD-10-CM

## 2016-01-23 DIAGNOSIS — Z716 Tobacco abuse counseling: Secondary | ICD-10-CM

## 2016-01-23 DIAGNOSIS — Z6829 Body mass index (BMI) 29.0-29.9, adult: Secondary | ICD-10-CM

## 2016-01-23 DIAGNOSIS — Z23 Encounter for immunization: Secondary | ICD-10-CM | POA: Diagnosis not present

## 2016-01-23 DIAGNOSIS — Z7189 Other specified counseling: Secondary | ICD-10-CM

## 2016-01-23 DIAGNOSIS — Z720 Tobacco use: Secondary | ICD-10-CM

## 2016-01-23 LAB — CBC WITH DIFFERENTIAL/PLATELET
BASOS ABS: 0 {cells}/uL (ref 0–200)
BASOS PCT: 0 %
EOS ABS: 0 {cells}/uL — AB (ref 15–500)
EOS PCT: 0 %
HCT: 43.1 % (ref 38.5–50.0)
Hemoglobin: 14.8 g/dL (ref 13.2–17.1)
LYMPHS PCT: 11 %
Lymphs Abs: 1727 cells/uL (ref 850–3900)
MCH: 30.9 pg (ref 27.0–33.0)
MCHC: 34.3 g/dL (ref 32.0–36.0)
MCV: 90 fL (ref 80.0–100.0)
MONOS PCT: 5 %
MPV: 9.3 fL (ref 7.5–12.5)
Monocytes Absolute: 785 cells/uL (ref 200–950)
NEUTROS ABS: 13188 {cells}/uL — AB (ref 1500–7800)
NEUTROS PCT: 84 %
PLATELETS: 243 10*3/uL (ref 140–400)
RBC: 4.79 MIL/uL (ref 4.20–5.80)
RDW: 13.5 % (ref 11.0–15.0)
WBC: 15.7 10*3/uL — ABNORMAL HIGH (ref 3.8–10.8)

## 2016-01-23 LAB — COMPREHENSIVE METABOLIC PANEL
ALK PHOS: 59 U/L (ref 40–115)
ALT: 12 U/L (ref 9–46)
AST: 15 U/L (ref 10–35)
Albumin: 4.2 g/dL (ref 3.6–5.1)
BILIRUBIN TOTAL: 0.4 mg/dL (ref 0.2–1.2)
BUN: 17 mg/dL (ref 7–25)
CO2: 27 mmol/L (ref 20–31)
CREATININE: 0.98 mg/dL (ref 0.70–1.33)
Calcium: 9.6 mg/dL (ref 8.6–10.3)
Chloride: 102 mmol/L (ref 98–110)
GLUCOSE: 100 mg/dL — AB (ref 65–99)
POTASSIUM: 4.4 mmol/L (ref 3.5–5.3)
SODIUM: 137 mmol/L (ref 135–146)
TOTAL PROTEIN: 7.1 g/dL (ref 6.1–8.1)

## 2016-01-23 NOTE — Progress Notes (Addendum)
Todd Mercado , 12-07-1960, 55 y.o., male MRN: [id number] Patient Care Team    Relationship Specialty Notifications Start End  Ma Hillock, DO PCP - General Family Medicine  07/02/15     CC: Surgical clearance.  Subjective:   Patient presents for OV for surgical clearance. He is to have a right total knee replacement on November 3rd. He has not had a CPE in 3 years.  Todd Mercado is a 55 y.o. caucasian male, heavy smoker with a > 30 pack year history. He currently smokes Marlboro Red, about a pack a day. He is interested in quitting and would be agreeable to trying medications and patches. He has never had PFT or used inhalers. He does have chronic cough, h/o bronchitis and increased phlegm production. CXR 06/06/2015 was normal. He does endorse dyspnea with climbing stairs.He has a history of mild OSA "years ago, >15 yr", but has never had a CPAP machine. He had a sleep study in 2004, a CPAP was recommended. His has a history of hypertension, controlled on lisinopril. 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 He endorses dyspnea with climbing stairs, and muscle weakness in his thighs, he thinks it is "from lack of oxygen" when climbing stairs. He does not get winded walking on flat ground.  He is not a known diabetic. He is overweight,  Body mass index is 29.36 kg/m.  He has a history in the EMR of EtOH abuse. He reports today he drinks socially on the weekends now only, about a 12 pack of beer.   No Known Allergies Social History  Substance Use Topics  . Smoking status: Heavy Tobacco Smoker    Packs/day: 1.50    Years: 30.00    Types: Cigarettes  . Smokeless tobacco: Never Used     Comment: discussed benefits of quitting  . Alcohol use Yes     Comment: 12 beers on weekend   Past Medical History:  Diagnosis Date  . Allergy   . Arthritis   . Cigar smoker motivated to quit   . Hypertension   . OSA (obstructive sleep apnea) 2004   CPAP recommended  .  Wears dentures    Past Surgical History:  Procedure Laterality Date  . DENTAL SURGERY     Family History  Problem Relation Age of Onset  . Hypertension Mother   . COPD Mother   . Arthritis Mother   . Heart disease Mother   . Emphysema Father   . Early death Father   . Arthritis Maternal Aunt   . Diabetes Maternal Aunt   . COPD Maternal Uncle      Medication List       Accurate as of 01/23/16 11:59 PM. Always use your most recent med list.          acetaminophen 500 MG tablet Commonly known as:  TYLENOL Take 1,000 mg by mouth every 6 (six) hours as needed for moderate pain.   albuterol 108 (90 Base) MCG/ACT inhaler Commonly known as:  PROVENTIL HFA;VENTOLIN HFA Inhale 2 puffs into the lungs every 6 (six) hours as needed for wheezing or shortness of breath.   lisinopril 5 MG tablet Commonly known as:  PRINIVIL,ZESTRIL Take 1 tablet (5 mg total) by mouth daily.   meloxicam 15 MG tablet Commonly known as:  MOBIC Take 1 tablet (15 mg total) by mouth daily.   oxyCODONE-acetaminophen 5-325 MG tablet Commonly known as:  PERCOCET/ROXICET       Results for  orders placed or performed in visit on 01/23/16 (from the past 24 hour(s))  CBC w/Diff     Status: Abnormal   Collection Time: 01/23/16  4:18 PM  Result Value Ref Range   WBC 15.7 (H) 3.8 - 10.8 K/uL   RBC 4.79 4.20 - 5.80 MIL/uL   Hemoglobin 14.8 13.2 - 17.1 g/dL   HCT 43.1 38.5 - 50.0 %   MCV 90.0 80.0 - 100.0 fL   MCH 30.9 27.0 - 33.0 pg   MCHC 34.3 32.0 - 36.0 g/dL   RDW 13.5 11.0 - 15.0 %   Platelets 243 140 - 400 K/uL   MPV 9.3 7.5 - 12.5 fL   Neutro Abs 13,188 (H) 1,500 - 7,800 cells/uL   Lymphs Abs 1,727 850 - 3,900 cells/uL   Monocytes Absolute 785 200 - 950 cells/uL   Eosinophils Absolute 0 (L) 15 - 500 cells/uL   Basophils Absolute 0 0 - 200 cells/uL   Neutrophils Relative % 84 %   Lymphocytes Relative 11 %   Monocytes Relative 5 %   Eosinophils Relative 0 %   Basophils Relative 0 %   Smear  Review Criteria for review not met    Narrative   Performed at:  [facility]                [address], [address]                [place], [place] [address]  Comp Met (CMET)     Status: Abnormal   Collection Time: 01/23/16  4:18 PM  Result Value Ref Range   Sodium 137 135 - 146 mmol/L   Potassium 4.4 3.5 - 5.3 mmol/L   Chloride 102 98 - 110 mmol/L   CO2 27 20 - 31 mmol/L   Glucose, Bld 100 (H) 65 - 99 mg/dL   BUN 17 7 - 25 mg/dL   Creat 0.98 0.70 - 1.33 mg/dL   Total Bilirubin 0.4 0.2 - 1.2 mg/dL   Alkaline Phosphatase 59 40 - 115 U/L   AST 15 10 - 35 U/L   ALT 12 9 - 46 U/L   Total Protein 7.1 6.1 - 8.1 g/dL   Albumin 4.2 3.6 - 5.1 g/dL   Calcium 9.6 8.6 - 10.3 mg/dL   Narrative   Performed at:  [facility]                [address], [address]                [place], [place] [address]  HgB A1c     Status: None   Collection Time: 01/23/16  4:18 PM  Result Value Ref Range   Hgb A1c MFr Bld 4.8 <5.7 %   Mean Plasma Glucose 91 mg/dL   Narrative   Performed at:  [address], [address]                [place], [place] [address]   No results found.   ROS: Negative, with the exception of above mentioned in HPI   Objective:  BP 137/85 (BP Location: Left Arm, Patient Position: Sitting, Cuff Size: Large)   Pulse 91   Temp 98 F (36.7 C)   Resp 20   Ht 6' (1.829 m)   Wt 216 lb 8 oz (98.2 kg)   SpO2 96%   BMI  29.36 kg/m  Body mass index is 29.36 kg/m. Gen: Afebrile. No acute distress. Nontoxic in appearance, well developed, well nourished. overweight caucasian male. Pleasant.  HENT: AT. . Bilateral TM visualized wnl. MMM, no oral lesions. Bilateral nares wnl. Throat without erythema or exudates. Cough and hoarseness present on exam.  Eyes:Pupils Equal Round Reactive to light, Extraocular movements intact,  Conjunctiva without redness, discharge or icterus. Neck/lymp/endocrine: Supple, no lymphadenopathy CV: RRR,  no murmur appreciated.  Trace/+1 edema R>L Chest: Mildly diminished breath sounds bilaterally, no wheeze or crackles. Abd: Soft. round. NTND. BS present. no Masses palpated. No rebound or guarding.  Skin: no rashes, purpura or petechiae. WWW, skin intact Neuro: Mild limping. PERLA. EOMi. Alert.  Psych: Normal affect, dress and demeanor. Normal speech. Normal thought content and judgment. EKG: SR. P-wave morphology changes and non-specific ST segment changes V3. HR 82. PR 158. QTC 400. QRSD 102.  Assessment/Plan: Todd Mercado is a 55 y.o. male present for OV for Surgical clearance for total knee replacement. Encounter for surgical counseling/clearance - intermediate risk procedure - ASA III - Dyspnea with climbing stairs reported, mild LE edema. - heavy smoker, moderate alcohol consumption - Mild EKG changes - likely untreated COPD  - OSA not treated - recommend cardiology and pulmonology eval. Referral placed high priority today.  Dyspnea/heavy smoker/smoking cessation - CBC w/Diff - Comp Met (CMET) - Discussed smoking cessation and different method types. Pt would like to try chantix and patches.  - Ambulatory referral to Pulmonology - Ambulatory referral to Cardiology - F/U 4 weeks for smoking cessation follow up after starting Chantix.   OSA (obstructive sleep apnea) - pulmonology referral. Pt needs full eval for lung disease and OSA. - I would like him to have better control of lung conditions prior to general anesthesia if possible  BMI 29.0-29.9,adult - HgB A1c  Essential hypertension, benign - stable. Continue lisinopril 5 mg Qd, refills provided today.  - CBC w/Diff - Comp Met (CMET)--> assess kidney function prior to surgery - F/U 6 months  Influenza vaccine administered - Flu Vaccine QUAD 36+ mos PF IM (Fluarix & Fluzone Quad PF)  Greater than 40 minutes spent with patient, >50% of time spent face to face counseling patient and coordinating  care.   electronically signed by:  Howard Pouch, DO  [place]

## 2016-01-23 NOTE — Patient Instructions (Signed)
I will call you with labs results.  Pulmonology referral placed.  Smoking cessation medication will be prescribed.

## 2016-01-24 ENCOUNTER — Encounter: Payer: Self-pay | Admitting: Family Medicine

## 2016-01-24 ENCOUNTER — Telehealth: Payer: Self-pay | Admitting: Family Medicine

## 2016-01-24 DIAGNOSIS — R0600 Dyspnea, unspecified: Secondary | ICD-10-CM | POA: Insufficient documentation

## 2016-01-24 DIAGNOSIS — Z716 Tobacco abuse counseling: Secondary | ICD-10-CM

## 2016-01-24 DIAGNOSIS — F17210 Nicotine dependence, cigarettes, uncomplicated: Secondary | ICD-10-CM

## 2016-01-24 DIAGNOSIS — G4733 Obstructive sleep apnea (adult) (pediatric): Secondary | ICD-10-CM | POA: Insufficient documentation

## 2016-01-24 LAB — HEMOGLOBIN A1C
HEMOGLOBIN A1C: 4.8 % (ref <5.7)
Mean Plasma Glucose: 91 mg/dL

## 2016-01-24 MED ORDER — LISINOPRIL 5 MG PO TABS: 5.0000 mg | ORAL_TABLET | Freq: Every day | ORAL | 1 refills | Status: DC

## 2016-01-24 MED ORDER — NICOTINE 14 MG/24HR TD PT24: 14.0000 mg | MEDICATED_PATCH | Freq: Every day | TRANSDERMAL | 0 refills | Status: DC

## 2016-01-24 MED ORDER — VARENICLINE TARTRATE 0.5 MG PO TABS: ORAL_TABLET | ORAL | 0 refills | Status: DC

## 2016-01-24 NOTE — Telephone Encounter (Signed)
Please call pt: - his labs look good. He has an elevated white count, which is likely from the steroids and inflammation in his knee.  - I have placed a referral to pulmonology and cardiology, to evaluate prior to surgery,  Since he had mild EKG changes and is winded with climbing stairs, considering his smoking history. He should be receiving calls to set this up very soon.  - I have also called in chantix and nicotine patches for him. Patient is to pick a quit date, Start the patch the night before quitting. Start the chantix a 10 days before quitting. He can start cutting back on amount of cigarettes during that time.  - he will to f/u in 1 month for smoking cessation and we can refill chantix for higher dose (needs tapered) and lower the patch dose.

## 2016-01-24 NOTE — Telephone Encounter (Signed)
Spoke with patient reviewed lab results and detailed instructions. Patient verbalized understanding.

## 2016-01-24 NOTE — Telephone Encounter (Signed)
Left message with patient mother for patient to call back.

## 2016-01-29 ENCOUNTER — Telehealth: Payer: Self-pay | Admitting: Internal Medicine

## 2016-01-29 NOTE — Telephone Encounter (Signed)
Received cardiac clearance request for pt to proceed w/ Rt TKR on 03/07/16 w/ Dr. Frederico Hammananiel Caffrey. Pt has new pt appt to est care w/ Dr. Okey DupreEnd on 02/18/16. Once cleared, please route to [facility] @ [phone], Attn: Tresa EndoKelly.

## 2016-02-08 ENCOUNTER — Ambulatory Visit (INDEPENDENT_AMBULATORY_CARE_PROVIDER_SITE_OTHER): Payer: BLUE CROSS/BLUE SHIELD | Admitting: Pulmonary Disease

## 2016-02-08 ENCOUNTER — Encounter: Payer: Self-pay | Admitting: Pulmonary Disease

## 2016-02-08 VITALS — BP 152/86 | HR 68 | Ht 72.0 in | Wt 216.6 lb

## 2016-02-08 DIAGNOSIS — G4733 Obstructive sleep apnea (adult) (pediatric): Secondary | ICD-10-CM | POA: Diagnosis not present

## 2016-02-08 DIAGNOSIS — R0600 Dyspnea, unspecified: Secondary | ICD-10-CM | POA: Diagnosis not present

## 2016-02-08 NOTE — Patient Instructions (Signed)
We will schedule you for pulmonary function tests and a sleep study. Started on Symbicort inhaler.  Return to clinic after these tests for further review.

## 2016-02-08 NOTE — Progress Notes (Signed)
Todd Mercado    [id number]    January 14, 1961  Primary Care Physician:Renee Claiborne Billings, DO  Referring Physician: Natalia Leatherwood, DO [address], [place] [address]  Chief complaint:   Consult for evaluation of pneumonia. Clearance for knee replacement.  HPI: Todd Mercado is a 55 year old with extensive smoking history. He is due to get his right knee replacement done in the last week of November. He is here for clearance before procedure.  He is an active smoker but is on Chantix currently and is trying to quit within the next week. He has daily complains of dyspnea on exertion, cough with sputum production, wheezing. He has never had PFTs or been on inhalers in the past. He had a sleep study done about 15 years ago. He was told that he has sleep apnea but never got started on CPAP therapy. He does have any symptoms of snoring, occasional daytime somnolence.  Outpatient Encounter Prescriptions as of 02/08/2016  Medication Sig  . acetaminophen (TYLENOL) 500 MG tablet Take 1,000 mg by mouth every 6 (six) hours as needed for moderate pain.  Marland Kitchen albuterol (PROVENTIL HFA;VENTOLIN HFA) 108 (90 Base) MCG/ACT inhaler Inhale 2 puffs into the lungs every 6 (six) hours as needed for wheezing or shortness of breath.  . lisinopril (PRINIVIL,ZESTRIL) 5 MG tablet Take 1 tablet (5 mg total) by mouth daily.  Marland Kitchen oxyCODONE-acetaminophen (PERCOCET/ROXICET) 5-325 MG tablet Take 1 tablet by mouth every 8 (eight) hours as needed.   . varenicline (CHANTIX) 0.5 MG tablet Day 1-3 0.5mg  QD, day 4-7 0.5mg  BID, then 1 mg BID  . nicotine (NICODERM CQ - DOSED IN MG/24 HOURS) 14 mg/24hr patch Place 1 patch (14 mg total) onto the skin daily. (Patient not taking: Reported on 02/08/2016)  . [DISCONTINUED] meloxicam (MOBIC) 15 MG tablet Take 1 tablet (15 mg total) by mouth daily. (Patient not taking: Reported on 02/08/2016)   No facility-administered encounter medications on file as of 02/08/2016.     Allergies as of  02/08/2016  . (No Known Allergies)    Past Medical History:  Diagnosis Date  . Allergy   . Arthritis   . Cigar smoker motivated to quit   . Hypertension   . OSA (obstructive sleep apnea) 2004   CPAP recommended  . Wears dentures     Past Surgical History:  Procedure Laterality Date  . DENTAL SURGERY      Family History  Problem Relation Age of Onset  . Hypertension Mother   . COPD Mother   . Arthritis Mother   . Heart disease Mother   . Emphysema Father   . Early death Father   . Arthritis Maternal Aunt   . Diabetes Maternal Aunt   . COPD Maternal Uncle     Social History   Social History  . Marital status: Divorced    Spouse name: N/A  . Number of children: 3  . Years of education: HS   Occupational History  .      sign service tech   Social History Main Topics  . Smoking status: Heavy Tobacco Smoker    Packs/day: 1.50    Years: 30.00    Types: Cigarettes  . Smokeless tobacco: Never Used     Comment: discussed benefits of quitting  . Alcohol use Yes     Comment: 12 beers on weekend  . Drug use: No  . Sexual activity: Yes    Partners: Female    Birth  control/ protection: Condom   Other Topics Concern  . Not on file   Social History Narrative   [name] is divorced. He has 3 children (Todd Mercado, Todd Mercado and [place])   HS education, employed as a Media plannersign tech, operates heavy machinery   Heavy tobacco use > 45 pack year history, started smoking at age 55.    Alcohol use. No drug use.    Wears his seatbelt, smoke detector in the home.   wears dentures   Feels safe in relationships.      Review of systems: Review of Systems  Constitutional: Negative for fever and chills.  HENT: Negative.   Eyes: Negative for blurred vision.  Respiratory: as per HPI  Cardiovascular: Negative for chest pain and palpitations.  Gastrointestinal: Negative for vomiting, diarrhea, blood per rectum. Genitourinary: Negative for dysuria, urgency, frequency and hematuria.    Musculoskeletal: Negative for myalgias, back pain and joint pain.  Skin: Negative for itching and rash.  Neurological: Negative for dizziness, tremors, focal weakness, seizures and loss of consciousness.  Endo/Heme/Allergies: Negative for environmental allergies.  Psychiatric/Behavioral: Negative for depression, suicidal ideas and hallucinations.  All other systems reviewed and are negative.   Physical Exam: Blood pressure (!) 152/86, pulse 68, height 6' (1.829 m), weight 216 lb 9.6 oz (98.2 kg), SpO2 100 %. Gen:      No acute distress HEENT:  EOMI, sclera anicteric Neck:     No masses; no thyromegaly Lungs:    B/L exp wheeze; normal respiratory effort CV:         Regular rate and rhythm; no murmurs Abd:      + bowel sounds; soft, non-tender; no palpable masses, no distension Ext:    No edema; adequate peripheral perfusion Skin:      Warm and dry; no rash Neuro: alert and oriented x 3 Psych: normal mood and affect  Data Reviewed: Chest x-ray 06/06/15 No acute abnormality. Images reviewed.  Assessment:  #1 COPD He likely has COPD based on his smoking history, presentation. He is wheezing in the office today. I'll start him on Symbicort inhaler and schedule him for pulmonary function tests.  #2 OSA He was diagnosed with OSA 15 years ago but never got started on CPAP therapy. He'll need to be reevaluated with a sleep study and started on CPAP.  #3 Active smoker He is on chantix with plans to quit in a week.   #4 Clearance for knee surgery Hold clearance till the above can be optimized. Reassess in a few weeks  Plan/Recommendations: - Start symbicort - PFTs, sleep study - Smoking cessation  Chilton GreathousePraveen Iyan Flett MD [facility] Pager [phone] 02/08/2016, 3:58 PM  CC: Kuneff, Renee A, DO

## 2016-02-11 ENCOUNTER — Ambulatory Visit (HOSPITAL_BASED_OUTPATIENT_CLINIC_OR_DEPARTMENT_OTHER): Payer: BLUE CROSS/BLUE SHIELD | Attending: Pulmonary Disease | Admitting: Pulmonary Disease

## 2016-02-11 DIAGNOSIS — G4733 Obstructive sleep apnea (adult) (pediatric): Secondary | ICD-10-CM | POA: Diagnosis present

## 2016-02-11 DIAGNOSIS — G4719 Other hypersomnia: Secondary | ICD-10-CM | POA: Diagnosis not present

## 2016-02-11 DIAGNOSIS — R0683 Snoring: Secondary | ICD-10-CM | POA: Insufficient documentation

## 2016-02-11 DIAGNOSIS — G4761 Periodic limb movement disorder: Secondary | ICD-10-CM | POA: Diagnosis not present

## 2016-02-11 DIAGNOSIS — I10 Essential (primary) hypertension: Secondary | ICD-10-CM | POA: Diagnosis not present

## 2016-02-11 DIAGNOSIS — R5383 Other fatigue: Secondary | ICD-10-CM | POA: Diagnosis not present

## 2016-02-12 MED ORDER — BUDESONIDE-FORMOTEROL FUMARATE 160-4.5 MCG/ACT IN AERO: 2.0000 | INHALATION_SPRAY | Freq: Two times a day (BID) | RESPIRATORY_TRACT | 0 refills | Status: DC

## 2016-02-12 NOTE — Addendum Note (Signed)
Addended by: Boone MasterJONES, JESSICA E on: 02/12/2016 12:00 PM   Modules accepted: Orders

## 2016-02-12 NOTE — Progress Notes (Addendum)
Patient seen in the office today and instructed on use of Symbicort 160-4.705mcg.  Patient expressed understanding and demonstrated technique. Boone MasterJessica Jones [facility]  02/08/2016

## 2016-02-13 DIAGNOSIS — G4730 Sleep apnea, unspecified: Secondary | ICD-10-CM | POA: Diagnosis not present

## 2016-02-13 NOTE — Procedures (Signed)
Patient Name: Todd Mercado, Todd Mercado Date: 02/11/2016 Gender: Male D.O.B: 1960/05/27 Age (years): 58 Referring Provider: Marshell Garfinkel Height (inches): 52 Interpreting Physician: Kara Mead MD, ABSM Weight (lbs): 216 RPSGT: Baxter Flattery BMI: 31 MRN: [id number] Neck Size: 17.00   CLINICAL INFORMATION Sleep Study Type: Split Night CPAP Indication for sleep study: Excessive Daytime Sleepiness, Fatigue, Hypertension, OSA, Snoring, Witnessed Apneas Epworth Sleepiness Score: 7   SLEEP STUDY TECHNIQUE As per the AASM Manual for the Scoring of Sleep and Associated Events v2.3 (April 2016) with a hypopnea requiring 4% desaturations. The channels recorded and monitored were frontal, central and occipital EEG, electrooculogram (EOG), submentalis EMG (chin), nasal and oral airflow, thoracic and abdominal wall motion, anterior tibialis EMG, snore microphone, electrocardiogram, and pulse oximetry. Continuous positive airway pressure (CPAP) was initiated when the patient met split night criteria and was titrated according to treat sleep-disordered breathing.   RESPIRATORY PARAMETERS Diagnostic Total AHI (/hr): 54.4 RDI (/hr): 83.7 OA Index (/hr): 53.4 CA Index (/hr): 0.0 REM AHI (/hr): 83.3 NREM AHI (/hr): 49.5 Supine AHI (/hr): 136.7 Non-supine AHI (/hr): 40.48 Min O2 Sat (%): 86.00 Mean O2 (%): 94.60 Time below 88% (min): 0.5   Titration Optimal Pressure (cm): 12 AHI at Optimal Pressure (/hr): 0.0 Min O2 at Optimal Pressure (%): 93.0 Supine % at Optimal (%): 0 Sleep % at Optimal (%): 84     SLEEP ARCHITECTURE The recording time for the entire night was 406.5 minutes. During a baseline period of 214.0 minutes, the patient slept for 124.7 minutes in REM and nonREM, yielding a sleep efficiency of 58.3%. Sleep onset after lights out was 10.8 minutes with a REM latency of 147.0 minutes. The patient spent 26.24% of the night in stage N1 sleep, 59.33% in stage N2 sleep, 0.00% in stage N3 and 14.43%  in REM. During the titration period of 188.6 minutes, the patient slept for 158.5 minutes in REM and nonREM, yielding a sleep efficiency of 84.0%. Sleep onset after CPAP initiation was 10.4 minutes with a REM latency of 19.0 minutes. The patient spent 3.47% of the night in stage N1 sleep, 61.83% in stage N2 sleep, 0.00% in stage N3 and 34.70% in REM.    CARDIAC DATA The 2 lead EKG demonstrated sinus rhythm. The mean heart rate was 43.45 beats per minute. Other EKG findings include: None.   LEG MOVEMENT DATA The total Periodic Limb Movements of Sleep (PLMS) were 209. The PLMS index was 44.15 .   IMPRESSIONS - Severe obstructive sleep apnea occurred during the diagnostic portion of the study (AHI = 54.4/hour). An optimal PAP pressure was selected for this patient ( 12 cm of water) - No significant central sleep apnea occurred during the diagnostic portion of the study (CAI = 0.0/hour). - Mild oxygen desaturation was noted during the diagnostic portion of the study (Min O2 = 86.00%). - The patient snored with Soft snoring volume during the diagnostic portion of the study. - No cardiac abnormalities were noted during this study. - Severe periodic limb movements of sleep occurred during the study.   DIAGNOSIS - Obstructive Sleep Apnea (327.23 [G47.33 ICD-10])   RECOMMENDATIONS - Trial of CPAP therapy on 12 cm H2O with a Large size Resmed Full Face Mask AirFit F20 mask and heated humidification. - Avoid alcohol, sedatives and other CNS depressants that may worsen sleep apnea and disrupt normal sleep architecture. - Sleep hygiene should be reviewed to assess factors that may improve sleep quality. - Weight management and regular exercise should be initiated or  continued. - Return to [facility] for re-evaluation after 4 weeks of therapy    Kara Mead MD Board Certified in [place]

## 2016-02-14 NOTE — Progress Notes (Signed)
ATC pt x2, line busy.  WCB.

## 2016-02-17 NOTE — Progress Notes (Signed)
New Outpatient Visit Date: 02/18/2016  Referring Provider: Burnell Blanks, MD [facility]  Chief Complaint: Preoperative risk assessment  HPI:  Todd Mercado is a 55 y.o. year-old male with history of hypertension, obstructive sleep apnea, and tobacco use, who has been referred by Dr. Madelon Lips for preop evaluation in anticipation of right knee arthroplasty by Dr. Madelon Lips on 03/07/16. The patient reports progressive right knee pain which is now severe. It makes it difficult for him to walk. He has received multiple injections without durable improvement. He has therefore elected to undergo right knee total arthroplasty. He also endorses left leg pain and swelling. He attributes this to bearing all his weight on the left side. He notes that the edema is worsened when standing for prolonged periods. He also notices a painful "knot" on the medial aspect of the proximal calf on the left. The patient denies a history of DVT. He has not had recent immobilization or surgery. He also denies a history of cancer.  Patient denies a history of cardiovascular disease. He denies chest pain. He has mild exertional dyspnea and is currently being evaluated by a [place] pulmonology for COPD and OSA. He was originally started on Symbicort but has not noticed significant improvement. He denies lightheadedness, palpitations, and orthopnea. He was previously diagnosed with obstructive sleep apnea but has not been treated for several years. He recently underwent repeat sleep study is awaiting to hear the results. He continues to smoke but is in the process of trying to quit.  --------------------------------------------------------------------------------------------------  Cardiovascular History & Procedures: Cardiovascular Problems:  Shortness of breath  Risk Factors:  Hypertension and tobacco use  Cath/PCI:  None  CV Surgery:  None  EP Procedures and Devices:  None  Non-Invasive  Evaluation(s):  None  Recent CV Pertinent Labs: Lab Results  Component Value Date   CHOL 135 02/11/2013   HDL 41.70 02/11/2013   LDLCALC 63 02/11/2013   TRIG 152.0 (H) 02/11/2013   CHOLHDL 3 02/11/2013   K 4.4 01/23/2016   K 4.7 12/25/2011   BUN 17 01/23/2016   BUN 13 12/25/2011   CREATININE 0.98 01/23/2016    --------------------------------------------------------------------------------------------------  Past Medical History:  Diagnosis Date  . Allergy   . Arthritis   . Cigar smoker motivated to quit   . Hypertension   . OSA (obstructive sleep apnea) 2004   CPAP recommended  . Wears dentures     Past Surgical History:  Procedure Laterality Date  . DENTAL SURGERY      Outpatient Encounter Prescriptions as of 02/18/2016  Medication Sig  . acetaminophen (TYLENOL) 500 MG tablet Take 1,000 mg by mouth every 6 (six) hours as needed for moderate pain.  Marland Kitchen albuterol (PROVENTIL HFA;VENTOLIN HFA) 108 (90 Base) MCG/ACT inhaler Inhale 2 puffs into the lungs every 6 (six) hours as needed for wheezing or shortness of breath.  . lisinopril (PRINIVIL,ZESTRIL) 10 MG tablet Take 1 tablet (10 mg total) by mouth daily.  . nicotine (NICODERM CQ - DOSED IN MG/24 HOURS) 14 mg/24hr patch Place 1 patch (14 mg total) onto the skin daily.  Marland Kitchen oxyCODONE-acetaminophen (PERCOCET/ROXICET) 5-325 MG tablet Take 1 tablet by mouth every 8 (eight) hours as needed for moderate pain or severe pain.   . varenicline (CHANTIX) 0.5 MG tablet Day 1-3 0.5mg  QD, day 4-7 0.5mg  BID, then 1 mg BID  . [DISCONTINUED] lisinopril (PRINIVIL,ZESTRIL) 5 MG tablet Take 1 tablet (5 mg total) by mouth daily.  . budesonide-formoterol (SYMBICORT) 160-4.5 MCG/ACT inhaler Inhale 2 puffs  into the lungs 2 (two) times daily.   No facility-administered encounter medications on file as of 02/18/2016.     Allergies: Review of patient's allergies indicates no known allergies.  Social History   Social History  . Marital status:  Divorced    Spouse name: N/A  . Number of children: 3  . Years of education: HS   Occupational History  .      sign service tech   Social History Main Topics  . Smoking status: Current Every Day Smoker    Packs/day: 1.00    Years: 30.00    Types: Cigarettes  . Smokeless tobacco: Never Used     Comment: discussed benefits of quitting  . Alcohol use 7.2 oz/week    12 Cans of beer per week     Comment: 12 beers on weekend  . Drug use: No  . Sexual activity: Yes    Partners: Female    Birth control/ protection: Condom   Other Topics Concern  . Not on file   Social History Narrative   Todd Mercado is divorced. He has 3 children ([place], [place] and [place])   HS education, employed as a Media planner, operates heavy machinery   Heavy tobacco use > 45 pack year history, started smoking at age 62.    Alcohol use. No drug use.    Wears his seatbelt, smoke detector in the home.   wears dentures   Feels safe in relationships.     Family History  Problem Relation Age of Onset  . Hypertension Mother   . COPD Mother   . Arthritis Mother   . Heart disease Mother     atrial fibrillation  . Emphysema Father   . Early death Father   . Arthritis Maternal Aunt   . Diabetes Maternal Aunt   . COPD Maternal Uncle   . Heart failure Maternal Grandfather     Review of Systems: A 12-system review of systems was performed and was negative except as noted in the HPI.  --------------------------------------------------------------------------------------------------  Physical Exam: BP (!) 150/90 (BP Location: Right Arm, Patient Position: Sitting, Cuff Size: Normal)   Pulse 70   Ht 6' (1.829 m)   Wt 219 lb (99.3 kg)   SpO2 95%   BMI 29.70 kg/m   General:  Overweight man seated comfortably in the exam room. HEENT: No conjunctival pallor or scleral icterus.  Moist mucous membranes.  OP clear. Neck: Supple without lymphadenopathy, thyromegaly, JVD, or HJR.  No carotid bruit. Lungs: Mildly  diminished breath sounds throughout. No wheezes or crackles. Normal work of breathing. Heart: Regular rate and rhythm without murmurs, rubs, or gallops.  Non-displaced PMI. Abd: Bowel sounds present.  Soft, NT/ND without hepatosplenomegaly Ext: 1+ left pretibial edema. Small area of firmness noted along the posterior medial aspect of the proximal calf with slight bluish discoloration. Question small hematoma or varicose vein. No warmth or redness of the left calf.  Radial, PT, and DP pulses are 2+ bilaterally Skin: warm and dry without rash Neuro: CNIII-XII intact.  Strength and fine-touch sensation intact in upper and lower extremities bilaterally. Patient ambulates with an antalgic gait. Psych: Normal mood and affect.  I personally observed the patient climb 3 flights of stairs without chest pain or significant dyspnea.  EKG:  Normal sinus rhythm without significant abnormalities.  Lab Results  Component Value Date   WBC 15.7 (H) 01/23/2016   HGB 14.8 01/23/2016   HCT 43.1 01/23/2016   MCV 90.0 01/23/2016  PLT 243 01/23/2016    Lab Results  Component Value Date   NA 137 01/23/2016   K 4.4 01/23/2016   CL 102 01/23/2016   CO2 27 01/23/2016   BUN 17 01/23/2016   CREATININE 0.98 01/23/2016   GLUCOSE 100 (H) 01/23/2016   ALT 12 01/23/2016    Lab Results  Component Value Date   CHOL 135 02/11/2013   HDL 41.70 02/11/2013   LDLCALC 63 02/11/2013   TRIG 152.0 (H) 02/11/2013   CHOLHDL 3 02/11/2013    --------------------------------------------------------------------------------------------------  ASSESSMENT AND PLAN: 1. Preop cardiovascular exam The patient does not have any signs or symptoms of unstable cardiac disease. He is able to perform more than 4 METs of activity, though this is somewhat difficult owing to his knee pain. I suspect his exertional dyspnea is most likely related to underlying pulmonary disease. Based on the ACS NSQIP calculator, the patient's risk of  perioperative cardiac arrest or complication is less than 1%. Given asymmetric left calf swelling (see details below), further evaluation to exclude DVT for surgery should be considered. Otherwise, further preoperative cardiovascular testing is not warranted. Preoperative pulmonary evaluation remains in progress by Dr. Isaiah SergeMannam.  2. Left lower extremity swelling Patient reports several weeks of intermittent swelling of the left leg with an area of firmness and tenderness along the proximal posterior medial calf. Given history of arthritis in that knee as well, this may reflect a Baker cyst that has ruptured. Other considerations would include DVT or chronic venous insufficiency with varicose veins. I recommended that we proceed with lower extremity venous duplex ultrasound. However, Todd Mercado wanted to discuss this with Dr. Madelon Lipsaffrey, whom he is seeing tomorrow.  3. Essential hypertension, benign Blood pressure is moderately elevated today. The patient is currently on lisinopril 5 mg daily. We will double this to 10 mg daily and have him return in one week for a basic metabolic panel to ensure that his renal function and potassium are stable. I will defer further management to his PCP.  Follow-up: Return to clinic as needed.  Yvonne Kendallhristopher Tania Perrott, MD 02/18/2016 4:12 PM

## 2016-02-18 ENCOUNTER — Ambulatory Visit (INDEPENDENT_AMBULATORY_CARE_PROVIDER_SITE_OTHER): Payer: BLUE CROSS/BLUE SHIELD | Admitting: Internal Medicine

## 2016-02-18 ENCOUNTER — Encounter (INDEPENDENT_AMBULATORY_CARE_PROVIDER_SITE_OTHER): Payer: Self-pay

## 2016-02-18 ENCOUNTER — Encounter: Payer: Self-pay | Admitting: Internal Medicine

## 2016-02-18 VITALS — BP 150/90 | HR 70 | Ht 72.0 in | Wt 219.0 lb

## 2016-02-18 DIAGNOSIS — Z01810 Encounter for preprocedural cardiovascular examination: Secondary | ICD-10-CM

## 2016-02-18 DIAGNOSIS — I10 Essential (primary) hypertension: Secondary | ICD-10-CM

## 2016-02-18 DIAGNOSIS — Z7689 Persons encountering health services in other specified circumstances: Secondary | ICD-10-CM

## 2016-02-18 MED ORDER — LISINOPRIL 10 MG PO TABS: 10.0000 mg | ORAL_TABLET | Freq: Every day | ORAL | 3 refills | Status: DC

## 2016-02-18 NOTE — Patient Instructions (Signed)
**Note De-Identified Trinidad Petron Obfuscation** Medication Instructions:  Increase Lisinopril to 10 mg daily. All other medications remain the same.  Labwork: Oct.23-BMET  Testing/Procedures: None  Follow-Up: As needed     If you need a refill on your cardiac medications before your next appointment, please call your pharmacy.

## 2016-02-19 ENCOUNTER — Ambulatory Visit (HOSPITAL_COMMUNITY)
Admission: RE | Admit: 2016-02-19 | Discharge: 2016-02-19 | Disposition: A | Discharge status: Home / Self Care | Payer: BLUE CROSS/BLUE SHIELD | Source: Ambulatory Visit | Attending: Family Medicine | Admitting: Family Medicine

## 2016-02-19 ENCOUNTER — Other Ambulatory Visit: Payer: Self-pay | Admitting: Pulmonary Disease

## 2016-02-19 ENCOUNTER — Other Ambulatory Visit (HOSPITAL_COMMUNITY): Payer: Self-pay | Admitting: Orthopedic Surgery

## 2016-02-19 DIAGNOSIS — M7989 Other specified soft tissue disorders: Principal | ICD-10-CM

## 2016-02-19 DIAGNOSIS — M79669 Pain in unspecified lower leg: Secondary | ICD-10-CM

## 2016-02-19 DIAGNOSIS — G4733 Obstructive sleep apnea (adult) (pediatric): Secondary | ICD-10-CM

## 2016-02-19 NOTE — Progress Notes (Signed)
*  Preliminary Results* Bilateral lower extremity venous duplex completed. Bilateral lower extremities are negative for deep vein thrombosis. There is no evidence of Baker's cyst bilaterally.  Incidental finding: there is a heterogenous area of the left medial popliteal fossa that is >5cm with internal vascularity that exhibits low resistant flow; etiology is unknown. There is a separate anechoic area with internal mixed echoes in the medial mid left calf, suggestive of a possible hematoma versus etiology unknown.  02/19/2016 4:56 PM Gertie FeyMichelle Shanicka Oldenkamp, BS, RVT, RDCS, RDMS

## 2016-02-19 NOTE — Progress Notes (Signed)
Called and spoke to pt. Informed him of the results and recs per PM. Order placed. Pt verbalized understanding and denied any further questions or concerns at this time.  

## 2016-02-20 ENCOUNTER — Telehealth: Payer: Self-pay | Admitting: Internal Medicine

## 2016-02-20 NOTE — Telephone Encounter (Signed)
Pt cb. He states he did not take time to fill out DPR when office, just signed. I advised him about the prelim doppler results and will call and inform once Dr End reviews them. I advised of Mother's request to have rx transferred to [facility]. He advised he would like for that to happen. I reviewed all upcoming appts for 10/23 and 10/25. He voiced understanding and agreed with plan.  I spoke with [facility] [place] and advised them to transfer RX from [facility]. They voiced understanding.

## 2016-02-20 NOTE — Telephone Encounter (Signed)
I do not see documentation of a call from our office. The pt does have upcoming appts, may have been reminder call.  Doppler results are still prelim. LMTCB

## 2016-02-20 NOTE — Telephone Encounter (Signed)
Pt's Mother cb. I advised her she was no longer listed on most recent DPR and I can not give her any information regarding healthcare. She insisted I contact [facility] and [facility] [place].  I advised her I can not do anything without pts authorization or DPR. She voiced understanding and states she will contact pt at work and he will call back.

## 2016-02-20 NOTE — Telephone Encounter (Signed)
Pt saw our number on his caller ID, has doppler done yesterday, wants to know if claling re those results-pls call  [phone]

## 2016-02-21 ENCOUNTER — Ambulatory Visit: Payer: Self-pay | Admitting: Physician Assistant

## 2016-02-21 NOTE — H&P (Signed)
TOTAL KNEE ADMISSION H&P  Patient is being admitted for right total knee arthroplasty.  Subjective:  Chief Complaint:right knee pain.  HPI: Todd Mercado, 55 y.o. male, has a history of pain and functional disability in the right knee due to arthritis and has failed non-surgical conservative treatments for greater than 12 weeks to includeNSAID's and/or analgesics, corticosteriod injections, viscosupplementation injections and activity modification.  Onset of symptoms was gradual, starting 6 years ago with gradually worsening course since that time. The patient noted no past surgery on the right knee(s).  Patient currently rates pain in the right knee(s) at 10 out of 10 with activity. Patient has night pain, worsening of pain with activity and weight bearing, pain that interferes with activities of daily living, pain with passive range of motion, crepitus and joint swelling.  Patient has evidence of periarticular osteophytes and joint space narrowing by imaging studies.There is no active infection.  Patient Active Problem List   Diagnosis Date Noted  . Dyspnea 01/24/2016  . BMI 29.0-29.9,adult 01/24/2016  . OSA (obstructive sleep apnea) 01/24/2016  . 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 Medial meniscus tear 11/12/2015  . Effusion of right knee 10/30/2015  . Right knee pain 10/04/2015  . History of ETOH abuse 03/11/2013  . Essential hypertension, benign 02/11/2013  . Encounter for smoking cessation counseling 02/11/2013  . Insomnia 02/11/2013   Past Medical History:  Diagnosis Date  . Allergy   . Arthritis   . Cigar smoker motivated to quit   . Hypertension   . OSA (obstructive sleep apnea) 2004   CPAP recommended  . Wears dentures     Past Surgical History:  Procedure Laterality Date  . DENTAL SURGERY       (Not in a hospital admission) No Known Allergies  Social History  Substance Use Topics  . Smoking status: Current Every Day Smoker    Packs/day: 1.00    Years: 30.00    Types:  Cigarettes  . Smokeless tobacco: Never Used     Comment: discussed benefits of quitting  . Alcohol use 7.2 oz/week    12 Cans of beer per week     Comment: 12 beers on weekend    Family History  Problem Relation Age of Onset  . Hypertension Mother   . COPD Mother   . Arthritis Mother   . Heart disease Mother     atrial fibrillation  . Emphysema Father   . Early death Father   . Arthritis Maternal Aunt   . Diabetes Maternal Aunt   . COPD Maternal Uncle   . Heart failure Maternal Grandfather      Review of Systems  Genitourinary: Positive for frequency and urgency.  Musculoskeletal: Positive for joint pain.  All other systems reviewed and are negative.   Objective:  Physical Exam  Constitutional: He is oriented to person, place, and time. He appears well-developed and well-nourished. No distress.  HENT:  Head: Normocephalic and atraumatic.  Nose: Nose normal.  Eyes: Conjunctivae and EOM are normal. Pupils are equal, round, and reactive to light.  Neck: Normal range of motion. Neck supple.  Cardiovascular: Normal rate, regular rhythm, normal heart sounds and intact distal pulses.   Respiratory: Effort normal and breath sounds normal. No respiratory distress.  GI: Soft. Bowel sounds are normal. He exhibits no distension. There is no tenderness.  Musculoskeletal:       Right knee: He exhibits decreased range of motion and swelling. Tenderness found.  Also with LLE calf swelling pain  Lymphadenopathy:    He has no cervical adenopathy.  Neurological: He is alert and oriented to person, place, and time. No cranial nerve deficit.  Skin: Skin is warm and dry. No rash noted. No erythema.  Psychiatric: He has a normal mood and affect. His behavior is normal.    Vital signs in last 24 hours: @VSRANGES @  Labs:   Estimated body mass index is 29.7 kg/m as calculated from the following:   Height as of 02/18/16: 6' (1.829 m).   Weight as of 02/18/16: 99.3 kg (219  lb).   Imaging Review Plain radiographs demonstrate moderate degenerative joint disease of the right knee(s). The overall alignment issignificant varus. The bone quality appears to be good for age and reported activity level.  Assessment/Plan:  End stage arthritis, right knee   The patient history, physical examination, clinical judgment of the provider and imaging studies are consistent with end stage degenerative joint disease of the right knee(s) and total knee arthroplasty is deemed medically necessary. The treatment options including medical management, injection therapy arthroscopy and arthroplasty were discussed at length. The risks and benefits of total knee arthroplasty were presented and reviewed. The risks due to aseptic loosening, infection, stiffness, patella tracking problems, thromboembolic complications and other imponderables were discussed. The patient acknowledged the explanation, agreed to proceed with the plan and consent was signed. Patient is being admitted for inpatient treatment for surgery, pain control, PT, OT, prophylactic antibiotics, VTE prophylaxis, progressive ambulation and ADL's and discharge planning. The patient is planning to be discharged home with home health services

## 2016-02-25 ENCOUNTER — Ambulatory Visit (HOSPITAL_COMMUNITY)
Admission: RE | Admit: 2016-02-25 | Discharge: 2016-02-25 | Disposition: A | Discharge status: Home / Self Care | Payer: BLUE CROSS/BLUE SHIELD | Source: Ambulatory Visit | Attending: Physician Assistant | Admitting: Physician Assistant

## 2016-02-25 ENCOUNTER — Other Ambulatory Visit: Payer: BLUE CROSS/BLUE SHIELD

## 2016-02-25 ENCOUNTER — Encounter (HOSPITAL_COMMUNITY)
Admission: RE | Admit: 2016-02-25 | Discharge: 2016-02-25 | Disposition: A | Discharge status: Home / Self Care | Payer: BLUE CROSS/BLUE SHIELD | Source: Ambulatory Visit | Attending: Orthopedic Surgery | Admitting: Orthopedic Surgery

## 2016-02-25 ENCOUNTER — Encounter (HOSPITAL_COMMUNITY): Payer: Self-pay

## 2016-02-25 DIAGNOSIS — Z01818 Encounter for other preprocedural examination: Secondary | ICD-10-CM | POA: Insufficient documentation

## 2016-02-25 DIAGNOSIS — M1711 Unilateral primary osteoarthritis, right knee: Secondary | ICD-10-CM

## 2016-02-25 LAB — CBC WITH DIFFERENTIAL/PLATELET
BASOS ABS: 0 10*3/uL (ref 0.0–0.1)
BASOS PCT: 1 %
Eosinophils Absolute: 0.1 10*3/uL (ref 0.0–0.7)
Eosinophils Relative: 1 %
HEMATOCRIT: 42.1 % (ref 39.0–52.0)
HEMOGLOBIN: 14.2 g/dL (ref 13.0–17.0)
LYMPHS PCT: 24 %
Lymphs Abs: 1.9 10*3/uL (ref 0.7–4.0)
MCH: 30 pg (ref 26.0–34.0)
MCHC: 33.7 g/dL (ref 30.0–36.0)
MCV: 88.8 fL (ref 78.0–100.0)
Monocytes Absolute: 0.5 10*3/uL (ref 0.1–1.0)
Monocytes Relative: 7 %
NEUTROS ABS: 5.3 10*3/uL (ref 1.7–7.7)
NEUTROS PCT: 67 %
Platelets: 176 10*3/uL (ref 150–400)
RBC: 4.74 MIL/uL (ref 4.22–5.81)
RDW: 13.4 % (ref 11.5–15.5)
WBC: 7.8 10*3/uL (ref 4.0–10.5)

## 2016-02-25 LAB — COMPREHENSIVE METABOLIC PANEL
ALBUMIN: 3.8 g/dL (ref 3.5–5.0)
ALT: 15 U/L — AB (ref 17–63)
AST: 18 U/L (ref 15–41)
Alkaline Phosphatase: 57 U/L (ref 38–126)
Anion gap: 8 (ref 5–15)
BILIRUBIN TOTAL: 0.3 mg/dL (ref 0.3–1.2)
BUN: 18 mg/dL (ref 6–20)
CHLORIDE: 101 mmol/L (ref 101–111)
CO2: 28 mmol/L (ref 22–32)
CREATININE: 0.92 mg/dL (ref 0.61–1.24)
Calcium: 9.3 mg/dL (ref 8.9–10.3)
GFR calc Af Amer: >60 mL/min (ref >60)
GLUCOSE: 107 mg/dL — AB (ref 65–99)
Potassium: 4.1 mmol/L (ref 3.5–5.1)
Sodium: 137 mmol/L (ref 135–145)
TOTAL PROTEIN: 6.9 g/dL (ref 6.5–8.1)

## 2016-02-25 LAB — URINALYSIS, ROUTINE W REFLEX MICROSCOPIC
BILIRUBIN URINE: NEGATIVE
Glucose, UA: NEGATIVE mg/dL
Hgb urine dipstick: NEGATIVE
KETONES UR: NEGATIVE mg/dL
LEUKOCYTES UA: NEGATIVE
NITRITE: NEGATIVE
PH: 6 (ref 5.0–8.0)
Protein, ur: 100 mg/dL — AB
Specific Gravity, Urine: 1.026 (ref 1.005–1.030)

## 2016-02-25 LAB — PROTIME-INR
INR: 1.01
PROTHROMBIN TIME: 13.3 s (ref 11.4–15.2)

## 2016-02-25 LAB — SURGICAL PCR SCREEN
MRSA, PCR: NEGATIVE
STAPHYLOCOCCUS AUREUS: POSITIVE — AB

## 2016-02-25 LAB — APTT: APTT: 26 s (ref 24–36)

## 2016-02-25 LAB — URINE MICROSCOPIC-ADD ON

## 2016-02-25 LAB — ABO/RH: ABO/RH(D): A NEG

## 2016-02-25 NOTE — Progress Notes (Signed)
I called a prescription for Mup[facility] ointment to [facility], [place], [place]. 

## 2016-02-25 NOTE — Pre-Procedure Instructions (Addendum)
Todd Mercado  02/25/2016      [id number] [place], [place] - [address] [place] [address] Phone: [phone] Fax: [phone]    Your procedure is scheduled on 03/07/16  [facility]  Call this number if you have problems the morning of surgery:  [phone]   Remember:  Do not eat food or drink liquids after midnight.  Take these medicines the morning of surgery with A SIP OF WATER inhalers if needed(bring albuterol), chanix  STOP all herbel meds, nsaids (aleve,naproxen,advil,ibuprofen) starting 02/29/16 including aspirin, meloxicam, vitamins   Do not wear jewelry, make-up or nail polish.  Do not wear lotions, powders, or perfumes, or deoderant.  Do not shave 48 hours prior to surgery.  Men may shave face and neck.  Do not bring valuables to the hospital.  [facility] is not responsible for any belongings or valuables.  Contacts, dentures or bridgework may not be worn into surgery.  Leave your suitcase in the car.  After surgery it may be brought to your room.  For patients admitted to the hospital, discharge time will be determined by your treatment team.  Patients discharged the day of surgery will not be allowed to drive home.   Name and phone number of your driver:    Special instructions:   Special Instructions: [facility] - Preparing for Surgery  Before surgery, you can play an important role.  Because skin is not sterile, your skin needs to be as free of germs as possible.  You can reduce the number of germs on you skin by washing with CHG (chlorahexidine gluconate) soap before surgery.  CHG is an antiseptic cleaner which kills germs and bonds with the skin to continue killing germs even after washing.  Please DO NOT use if you have an allergy to CHG or antibacterial soaps.  If your skin becomes reddened/irritated stop using the CHG and inform your  nurse when you arrive at Short Stay.  Do not shave (including legs and underarms) for at least 48 hours prior to the first CHG shower.  You may shave your face.  Please follow these instructions carefully:   1.  Shower with CHG Soap the night before surgery and the morning of Surgery.  2.  If you choose to wash your hair, wash your hair first as usual with your normal shampoo.  3.  After you shampoo, rinse your hair and body thoroughly to remove the Shampoo.  4.  Use CHG as you would any other liquid soap.  You can apply chg directly  to the skin and wash gently with scrungie or a clean washcloth.  5.  Apply the CHG Soap to your body ONLY FROM THE NECK DOWN.  Do not use on open wounds or open sores.  Avoid contact with your eyes ears, mouth and genitals (private parts).  Wash genitals (private parts)       with your normal soap.  6.  Wash thoroughly, paying special attention to the area where your surgery will be performed.  7.  Thoroughly rinse your body with warm water from the neck down.  8.  DO NOT shower/wash with your normal soap after using and rinsing off the CHG Soap.  9.  Pat yourself dry with a clean towel.            10.  Wear clean pajamas.  11.  Place clean sheets on your bed the night of your first shower and do not sleep with pets.  Day of Surgery  Do not apply any lotions/deodorants the morning of surgery.  Please wear clean clothes to the hospital/surgery center.  Please read over the  fact sheets that you were given.

## 2016-02-26 ENCOUNTER — Telehealth: Payer: Self-pay | Admitting: Internal Medicine

## 2016-02-26 LAB — URINE CULTURE

## 2016-02-26 LAB — TYPE AND SCREEN
ABO/RH(D): A NEG
ANTIBODY SCREEN: NEGATIVE

## 2016-02-26 NOTE — Telephone Encounter (Signed)
Faxed cardiac clearance, EKG, and OV [facility], [phone]. Pt scheduled 11/3 right TKR.

## 2016-02-27 ENCOUNTER — Encounter: Payer: Self-pay | Admitting: Pulmonary Disease

## 2016-02-27 ENCOUNTER — Ambulatory Visit (INDEPENDENT_AMBULATORY_CARE_PROVIDER_SITE_OTHER): Payer: BLUE CROSS/BLUE SHIELD | Admitting: Pulmonary Disease

## 2016-02-27 ENCOUNTER — Ambulatory Visit (HOSPITAL_COMMUNITY)
Admission: RE | Admit: 2016-02-27 | Discharge: 2016-02-27 | Disposition: A | Discharge status: Home / Self Care | Payer: BLUE CROSS/BLUE SHIELD | Source: Ambulatory Visit | Attending: Pulmonary Disease | Admitting: Pulmonary Disease

## 2016-02-27 ENCOUNTER — Ambulatory Visit (HOSPITAL_COMMUNITY): Payer: BLUE CROSS/BLUE SHIELD

## 2016-02-27 VITALS — BP 138/82 | HR 61 | Ht 72.0 in | Wt 219.0 lb

## 2016-02-27 DIAGNOSIS — R0600 Dyspnea, unspecified: Secondary | ICD-10-CM | POA: Diagnosis not present

## 2016-02-27 DIAGNOSIS — J439 Emphysema, unspecified: Secondary | ICD-10-CM | POA: Diagnosis not present

## 2016-02-27 DIAGNOSIS — G4733 Obstructive sleep apnea (adult) (pediatric): Secondary | ICD-10-CM | POA: Diagnosis not present

## 2016-02-27 LAB — PULMONARY FUNCTION TEST
DL/VA % PRED: 81 %
DL/VA: 3.88 ml/min/mmHg/L
DLCO COR % PRED: 79 %
DLCO COR: 27.72 ml/min/mmHg
DLCO unc % pred: 78 %
DLCO unc: 27.4 ml/min/mmHg
FEF 25-75 POST: 1.36 L/s
FEF 25-75 Pre: 1.18 L/sec
FEF2575-%CHANGE-POST: 15 %
FEF2575-%PRED-POST: 38 %
FEF2575-%PRED-PRE: 33 %
FEV1-%CHANGE-POST: 0 %
FEV1-%Pred-Post: 60 %
FEV1-%Pred-Pre: 60 %
FEV1-POST: 2.49 L
FEV1-Pre: 2.47 L
FEV1FVC-%CHANGE-POST: -7 %
FEV1FVC-%PRED-PRE: 80 %
FEV6-%Change-Post: 5 %
FEV6-%Pred-Post: 78 %
FEV6-%Pred-Pre: 74 %
FEV6-POST: 4.01 L
FEV6-Pre: 3.8 L
FEV6FVC-%Change-Post: -2 %
FEV6FVC-%PRED-POST: 96 %
FEV6FVC-%Pred-Pre: 99 %
FVC-%CHANGE-POST: 8 %
FVC-%PRED-POST: 81 %
FVC-%PRED-PRE: 75 %
FVC-POST: 4.33 L
FVC-PRE: 4 L
POST FEV1/FVC RATIO: 57 %
PRE FEV1/FVC RATIO: 62 %
PRE FEV6/FVC RATIO: 95 %
Post FEV6/FVC ratio: 92 %
RV % pred: 100 %
RV: 2.22 L
TLC % pred: 84 %
TLC: 6.27 L

## 2016-02-27 MED ORDER — ALBUTEROL SULFATE (2.5 MG/3ML) 0.083% IN NEBU
2.5000 mg | INHALATION_SOLUTION | Freq: Once | RESPIRATORY_TRACT | Status: AC
  Administered 2016-02-27: 2.5 mg via RESPIRATORY_TRACT

## 2016-02-27 NOTE — Progress Notes (Addendum)
Todd Mercado    [id number]    1960/07/22  Primary Care Physician:Todd Claiborne BillingsKuneff, DO  Referring Physician: Natalia Leatherwoodenee A Kuneff, DO [address] [place], [place] [address]  Chief complaint:   Consult for evaluation of pneumonia. Clearance for knee replacement.  HPI: Todd Mercado is a 55 year old with extensive smoking history. He is due to get his right knee replacement done in early october November. He is here for clearance before procedure.  He is an active smoker but is on Chantix currently and is trying to quit. He has daily complains of dyspnea on exertion, cough with sputum production, wheezing. He has never had PFTs or been on inhalers in the past. He had a sleep study done about 15 years ago. He was told that he has sleep apnea but never got started on CPAP therapy. He does have any symptoms of snoring, occasional daytime somnolence.  Outpatient Encounter Prescriptions as of 02/27/2016  Medication Sig  . acetaminophen (TYLENOL) 500 MG tablet Take 1,000 mg by mouth every 6 (six) hours as needed for moderate pain.  Marland Kitchen. albuterol (PROVENTIL HFA;VENTOLIN HFA) 108 (90 Base) MCG/ACT inhaler Inhale 2 puffs into the lungs every 6 (six) hours as needed for wheezing or shortness of breath.  . budesonide-formoterol (SYMBICORT) 160-4.5 MCG/ACT inhaler Inhale 2 puffs into the lungs 2 (two) times daily.  Marland Kitchen. lisinopril (PRINIVIL,ZESTRIL) 10 MG tablet Take 1 tablet (10 mg total) by mouth daily.  . nicotine (NICODERM CQ - DOSED IN MG/24 HOURS) 14 mg/24hr patch Place 1 patch (14 mg total) onto the skin daily.  . varenicline (CHANTIX) 0.5 MG tablet Day 1-3 0.5mg  QD, day 4-7 0.5mg  BID, then 1 mg BID (Patient taking differently: Take 1 mg by mouth 2 (two) times daily. Day 1-3 0.5mg  QD, day 4-7 0.5mg  BID, then 1 mg BID)   No facility-administered encounter medications on file as of 02/27/2016.     Allergies as of 02/27/2016  . (No Known Allergies)    Past Medical History:  Diagnosis Date  . Allergy    . Arthritis   . Cigar smoker motivated to quit   . Hypertension   . OSA (obstructive sleep apnea) 2004   CPAP recommended- to meet with home health 02/26/16  . Wears dentures     Past Surgical History:  Procedure Laterality Date  . DENTAL SURGERY      Family History  Problem Relation Age of Onset  . Hypertension Mother   . COPD Mother   . Arthritis Mother   . Heart disease Mother     atrial fibrillation  . Emphysema Father   . Early death Father   . Arthritis Maternal Aunt   . Diabetes Maternal Aunt   . COPD Maternal Uncle   . Heart failure Maternal Grandfather     Social History   Social History  . Marital status: Divorced    Spouse name: N/A  . Number of children: 3  . Years of education: HS   Occupational History  .      sign service tech   Social History Main Topics  . Smoking status: Current Every Day Smoker    Packs/day: 1.00    Years: 30.00    Types: Cigarettes  . Smokeless tobacco: Never Used     Comment: discussed benefits of quitting  . Alcohol use 7.2 oz/week    12 Cans of beer per week     Comment: 12 beers on weekend  . Drug  use: No  . Sexual activity: Yes    Partners: Female    Birth control/ protection: Condom   Other Topics Concern  . Not on file   Social History Narrative   [name] is divorced. He has 3 children (Todd Mercado, Todd Mercado and Todd Mercado)   HS education, employed as a Media planner, operates heavy machinery   Heavy tobacco use > 45 pack year history, started smoking at age 55.    Alcohol use. No drug use.    Wears his seatbelt, smoke detector in the home.   wears dentures   Feels safe in relationships.      Review of systems: Review of Systems  Constitutional: Negative for fever and chills.  HENT: Negative.   Eyes: Negative for blurred vision.  Respiratory: as per HPI  Cardiovascular: Negative for chest pain and palpitations.  Gastrointestinal: Negative for vomiting, diarrhea, blood per rectum. Genitourinary: Negative for  dysuria, urgency, frequency and hematuria.  Musculoskeletal: Negative for myalgias, back pain and joint pain.  Skin: Negative for itching and rash.  Neurological: Negative for dizziness, tremors, focal weakness, seizures and loss of consciousness.  Endo/Heme/Allergies: Negative for environmental allergies.  Psychiatric/Behavioral: Negative for depression, suicidal ideas and hallucinations.  All other systems reviewed and are negative.   Physical Exam: Blood pressure (!) 152/86, pulse 68, height 6' (1.829 m), weight 216 lb 9.6 oz (98.2 kg), SpO2 100 %. Gen:      No acute distress HEENT:  EOMI, sclera anicteric Neck:     No masses; no thyromegaly Lungs:  No wheeze; normal respiratory effort CV:         Regular rate and rhythm; no murmurs Abd:      + bowel sounds; soft, non-tender; no palpable masses, no distension Ext:    No edema; adequate peripheral perfusion Skin:      Warm and dry; no rash Neuro: alert and oriented x 3 Psych: normal mood and affect  Data Reviewed: Chest x-ray 06/06/15 No acute abnormality. Images reviewed.  PFTs 02/27/16 FVC 4.33 (81%) FEV1 2.49 (60%) F/57 TLC 84% DLCO 78% Moderate obstructive disease with minimal diffusion defect.  Sleep study 02/11/16 Severe OSA. 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  He is stable on the Symbicort inhaler. Wheezing is markedly improved.  #2 OSA He was diagnosed with OSA 15 years ago but never got started on CPAP therapy. He had a sleep study done earlier this month which reconfirmed OSA. He's been on CPAP for the past one day and he is tolerating it well.  #3 Active smoker He is cutting back quitting. He does not need any additional help with smoking cessation..   #4 Clearance for knee surgery He is at low risk for perioperative pulmonary complications post surgery. He is cleared for knee surgery. We recommend that he continue to use the CPAP postop with early ambulation, incentive  spirometry, DVT prophylaxis.  Plan/Recommendations: - Continue symbicort, CPAP - Cleared for knee surgery - Smoking cessation  Todd Greathouse MD Todd Mercado [facility] Pager [phone] 02/27/2016, 1:28 PM  CC: Mercado, Todd A, DO   Addendum CPAP compliance report 02/28/16- 03/28/16 Days < 4 hrs 40% Average use 3hrs 12 mins AHI 6.3  Pt is not very compliant. Will readdress at next visit.   Todd Greathouse MD [facility] Pager [phone] If no answer or after 3pm call: [phone] 04/15/2016, 1:47 PM

## 2016-02-27 NOTE — Patient Instructions (Signed)
We will send in the clearance for knee surgery to your surgeon's office. Continue using your inhaler as prescribed. Continue using the CPAP. Work on quitting smoking.  Return to clinic in 3 months.

## 2016-03-06 MED ORDER — BUPIVACAINE LIPOSOME 1.3 % IJ SUSP
20.0000 mL | Freq: Once | INTRAMUSCULAR | Status: DC
Start: 2016-03-07 — End: 2016-03-09
  Filled 2016-03-06: qty 20

## 2016-03-06 MED ORDER — SODIUM CHLORIDE 0.9 % IV SOLN
1000.0000 mg | INTRAVENOUS | Status: AC
  Administered 2016-03-07: 1000 mg via INTRAVENOUS
  Filled 2016-03-06 (×2): qty 10

## 2016-03-07 ENCOUNTER — Inpatient Hospital Stay (HOSPITAL_COMMUNITY)
Admission: RE | Admit: 2016-03-07 | Discharge: 2016-03-09 | DRG: 470 | Disposition: A | Discharge status: Home Health | Payer: BLUE CROSS/BLUE SHIELD | Source: Ambulatory Visit | Attending: Orthopedic Surgery | Admitting: Orthopedic Surgery

## 2016-03-07 ENCOUNTER — Encounter (HOSPITAL_COMMUNITY)
Admission: RE | Disposition: A | Discharge status: Home Health | Payer: Self-pay | Source: Ambulatory Visit | Attending: Orthopedic Surgery

## 2016-03-07 ENCOUNTER — Inpatient Hospital Stay (HOSPITAL_COMMUNITY): Payer: BLUE CROSS/BLUE SHIELD | Admitting: Anesthesiology

## 2016-03-07 ENCOUNTER — Encounter (HOSPITAL_COMMUNITY): Payer: Self-pay | Admitting: General Practice

## 2016-03-07 DIAGNOSIS — I10 Essential (primary) hypertension: Secondary | ICD-10-CM | POA: Diagnosis present

## 2016-03-07 DIAGNOSIS — R262 Difficulty in walking, not elsewhere classified: Secondary | ICD-10-CM

## 2016-03-07 DIAGNOSIS — F17210 Nicotine dependence, cigarettes, uncomplicated: Secondary | ICD-10-CM | POA: Diagnosis present

## 2016-03-07 DIAGNOSIS — M21161 Varus deformity, not elsewhere classified, right knee: Secondary | ICD-10-CM | POA: Diagnosis present

## 2016-03-07 DIAGNOSIS — M1711 Unilateral primary osteoarthritis, right knee: Secondary | ICD-10-CM | POA: Diagnosis present

## 2016-03-07 HISTORY — PX: TOTAL KNEE ARTHROPLASTY: SHX125

## 2016-03-07 SURGERY — ARTHROPLASTY, KNEE, TOTAL
Anesthesia: Regional | Laterality: Right

## 2016-03-07 MED ORDER — FLEET ENEMA 7-19 GM/118ML RE ENEM: 1.0000 | ENEMA | Freq: Once | RECTAL | Status: DC | PRN

## 2016-03-07 MED ORDER — APIXABAN 2.5 MG PO TABS
2.5000 mg | ORAL_TABLET | Freq: Two times a day (BID) | ORAL | Status: DC
  Administered 2016-03-08 – 2016-03-09 (×3): 2.5 mg via ORAL
  Filled 2016-03-07 (×3): qty 1

## 2016-03-07 MED ORDER — ONDANSETRON HCL 4 MG/2ML IJ SOLN: 4.0000 mg | Freq: Four times a day (QID) | INTRAMUSCULAR | Status: DC | PRN

## 2016-03-07 MED ORDER — OXYCODONE HCL 5 MG/5ML PO SOLN: 5.0000 mg | Freq: Once | ORAL | Status: DC | PRN

## 2016-03-07 MED ORDER — SODIUM CHLORIDE 0.9% FLUSH
INTRAVENOUS | Status: DC | PRN
  Administered 2016-03-07: 10 mL

## 2016-03-07 MED ORDER — ONDANSETRON HCL 4 MG PO TABS: 4.0000 mg | ORAL_TABLET | Freq: Four times a day (QID) | ORAL | Status: DC | PRN

## 2016-03-07 MED ORDER — CEFAZOLIN SODIUM-DEXTROSE 2-4 GM/100ML-% IV SOLN
2.0000 g | INTRAVENOUS | Status: AC
  Administered 2016-03-07: 2 g via INTRAVENOUS

## 2016-03-07 MED ORDER — SODIUM CHLORIDE 0.9 % IV SOLN: INTRAVENOUS | Status: DC

## 2016-03-07 MED ORDER — PROPOFOL 10 MG/ML IV BOLUS
INTRAVENOUS | Status: AC
  Filled 2016-03-07: qty 20

## 2016-03-07 MED ORDER — HYDROMORPHONE HCL 1 MG/ML IJ SOLN
0.2500 mg | INTRAMUSCULAR | Status: DC | PRN
  Administered 2016-03-07 (×4): 0.5 mg via INTRAVENOUS

## 2016-03-07 MED ORDER — ACETAMINOPHEN 650 MG RE SUPP: 650.0000 mg | Freq: Four times a day (QID) | RECTAL | Status: DC | PRN

## 2016-03-07 MED ORDER — MENTHOL 3 MG MT LOZG: 1.0000 | LOZENGE | OROMUCOSAL | Status: DC | PRN

## 2016-03-07 MED ORDER — MIDAZOLAM HCL 2 MG/2ML IJ SOLN
INTRAMUSCULAR | Status: AC
  Filled 2016-03-07: qty 2

## 2016-03-07 MED ORDER — DOCUSATE SODIUM 100 MG PO CAPS
100.0000 mg | ORAL_CAPSULE | Freq: Two times a day (BID) | ORAL | Status: DC
  Administered 2016-03-07 – 2016-03-09 (×5): 100 mg via ORAL
  Filled 2016-03-07 (×5): qty 1

## 2016-03-07 MED ORDER — OXYCODONE HCL 5 MG PO TABS: 5.0000 mg | ORAL_TABLET | Freq: Once | ORAL | Status: DC | PRN

## 2016-03-07 MED ORDER — APIXABAN 2.5 MG PO TABS: 2.5000 mg | ORAL_TABLET | Freq: Two times a day (BID) | ORAL | 0 refills | Status: DC

## 2016-03-07 MED ORDER — CEFAZOLIN IN D5W 1 GM/50ML IV SOLN
1.0000 g | Freq: Four times a day (QID) | INTRAVENOUS | Status: AC
  Administered 2016-03-07 (×2): 1 g via INTRAVENOUS
  Filled 2016-03-07 (×2): qty 50

## 2016-03-07 MED ORDER — 0.9 % SODIUM CHLORIDE (POUR BTL) OPTIME
TOPICAL | Status: DC | PRN
  Administered 2016-03-07: 1000 mL

## 2016-03-07 MED ORDER — METHOCARBAMOL 500 MG PO TABS
500.0000 mg | ORAL_TABLET | Freq: Four times a day (QID) | ORAL | Status: DC | PRN
  Administered 2016-03-07 – 2016-03-09 (×4): 500 mg via ORAL
  Filled 2016-03-07 (×4): qty 1

## 2016-03-07 MED ORDER — PROPOFOL 10 MG/ML IV BOLUS
INTRAVENOUS | Status: AC
  Filled 2016-03-07: qty 40

## 2016-03-07 MED ORDER — LIDOCAINE HCL (CARDIAC) 20 MG/ML IV SOLN
INTRAVENOUS | Status: DC | PRN
  Administered 2016-03-07: 50 mg via INTRATRACHEAL

## 2016-03-07 MED ORDER — OXYCODONE HCL 5 MG PO TABS
5.0000 mg | ORAL_TABLET | ORAL | Status: DC | PRN
  Administered 2016-03-07 – 2016-03-09 (×9): 10 mg via ORAL
  Filled 2016-03-07 (×9): qty 2

## 2016-03-07 MED ORDER — NICOTINE 14 MG/24HR TD PT24
14.0000 mg | MEDICATED_PATCH | Freq: Every day | TRANSDERMAL | Status: DC
  Administered 2016-03-07 – 2016-03-09 (×3): 14 mg via TRANSDERMAL
  Filled 2016-03-07 (×3): qty 1

## 2016-03-07 MED ORDER — TRANEXAMIC ACID 1000 MG/10ML IV SOLN
2000.0000 mg | INTRAVENOUS | Status: AC
  Administered 2016-03-07: 2000 mg via TOPICAL
  Filled 2016-03-07: qty 20

## 2016-03-07 MED ORDER — SODIUM CHLORIDE 0.9 % IR SOLN
Status: DC | PRN
  Administered 2016-03-07: 3000 mL

## 2016-03-07 MED ORDER — FENTANYL CITRATE (PF) 100 MCG/2ML IJ SOLN
INTRAMUSCULAR | Status: DC | PRN
  Administered 2016-03-07: 25 ug via INTRAVENOUS
  Administered 2016-03-07 (×2): 50 ug via INTRAVENOUS
  Administered 2016-03-07: 100 ug via INTRAVENOUS
  Administered 2016-03-07: 25 ug via INTRAVENOUS
  Administered 2016-03-07: 50 ug via INTRAVENOUS

## 2016-03-07 MED ORDER — HYDROMORPHONE HCL 2 MG/ML IJ SOLN
1.0000 mg | INTRAMUSCULAR | Status: DC | PRN
  Administered 2016-03-08: 1 mg via INTRAVENOUS
  Filled 2016-03-07: qty 1

## 2016-03-07 MED ORDER — DIPHENHYDRAMINE HCL 50 MG/ML IJ SOLN
INTRAMUSCULAR | Status: AC
  Filled 2016-03-07: qty 2

## 2016-03-07 MED ORDER — METOCLOPRAMIDE HCL 5 MG PO TABS: 5.0000 mg | ORAL_TABLET | Freq: Three times a day (TID) | ORAL | Status: DC | PRN

## 2016-03-07 MED ORDER — CEFAZOLIN SODIUM-DEXTROSE 2-4 GM/100ML-% IV SOLN
INTRAVENOUS | Status: AC
  Filled 2016-03-07: qty 100

## 2016-03-07 MED ORDER — FENTANYL CITRATE (PF) 100 MCG/2ML IJ SOLN
INTRAMUSCULAR | Status: AC
  Filled 2016-03-07: qty 2

## 2016-03-07 MED ORDER — METHOCARBAMOL 500 MG PO TABS: 500.0000 mg | ORAL_TABLET | Freq: Four times a day (QID) | ORAL | 1 refills | Status: DC | PRN

## 2016-03-07 MED ORDER — PHENYLEPHRINE HCL 10 MG/ML IJ SOLN
INTRAMUSCULAR | Status: DC | PRN
  Administered 2016-03-07 (×4): 80 ug via INTRAVENOUS

## 2016-03-07 MED ORDER — LIDOCAINE 2% (20 MG/ML) 5 ML SYRINGE
INTRAMUSCULAR | Status: AC
  Filled 2016-03-07: qty 5

## 2016-03-07 MED ORDER — PHENOL 1.4 % MT LIQD: 1.0000 | OROMUCOSAL | Status: DC | PRN

## 2016-03-07 MED ORDER — HYDROMORPHONE HCL 2 MG/ML IJ SOLN
INTRAMUSCULAR | Status: AC
  Filled 2016-03-07: qty 1

## 2016-03-07 MED ORDER — EPHEDRINE SULFATE 50 MG/ML IJ SOLN
INTRAMUSCULAR | Status: DC | PRN
  Administered 2016-03-07: 5 mg via INTRAVENOUS
  Administered 2016-03-07: 10 mg via INTRAVENOUS

## 2016-03-07 MED ORDER — TRANEXAMIC ACID 1000 MG/10ML IV SOLN
1000.0000 mg | Freq: Once | INTRAVENOUS | Status: AC
  Administered 2016-03-07: 1000 mg via INTRAVENOUS
  Filled 2016-03-07: qty 10

## 2016-03-07 MED ORDER — ONDANSETRON HCL 4 MG/2ML IJ SOLN
INTRAMUSCULAR | Status: DC | PRN
  Administered 2016-03-07: 4 mg via INTRAVENOUS

## 2016-03-07 MED ORDER — PROPOFOL 10 MG/ML IV BOLUS
INTRAVENOUS | Status: DC | PRN
  Administered 2016-03-07: 200 mg via INTRAVENOUS
  Administered 2016-03-07: 30 mg via INTRAVENOUS

## 2016-03-07 MED ORDER — SORBITOL 70 % SOLN: 30.0000 mL | Freq: Every day | Status: DC | PRN

## 2016-03-07 MED ORDER — CHLORHEXIDINE GLUCONATE 4 % EX LIQD: 60.0000 mL | Freq: Once | CUTANEOUS | Status: DC

## 2016-03-07 MED ORDER — BUPIVACAINE LIPOSOME 1.3 % IJ SUSP
INTRAMUSCULAR | Status: DC | PRN
  Administered 2016-03-07: 20 mL

## 2016-03-07 MED ORDER — LACTATED RINGERS IV SOLN
INTRAVENOUS | Status: DC | PRN
  Administered 2016-03-07 (×2): via INTRAVENOUS

## 2016-03-07 MED ORDER — METOCLOPRAMIDE HCL 5 MG/ML IJ SOLN: 5.0000 mg | Freq: Three times a day (TID) | INTRAMUSCULAR | Status: DC | PRN

## 2016-03-07 MED ORDER — LISINOPRIL 10 MG PO TABS
10.0000 mg | ORAL_TABLET | Freq: Every day | ORAL | Status: DC
  Administered 2016-03-08 – 2016-03-09 (×2): 10 mg via ORAL
  Filled 2016-03-07 (×2): qty 1

## 2016-03-07 MED ORDER — DIPHENHYDRAMINE HCL 12.5 MG/5ML PO ELIX: 12.5000 mg | ORAL_SOLUTION | ORAL | Status: DC | PRN

## 2016-03-07 MED ORDER — OXYCODONE-ACETAMINOPHEN 5-325 MG PO TABS: 1.0000 | ORAL_TABLET | ORAL | 0 refills | Status: DC | PRN

## 2016-03-07 MED ORDER — ACETAMINOPHEN 325 MG PO TABS: 650.0000 mg | ORAL_TABLET | Freq: Four times a day (QID) | ORAL | Status: DC | PRN

## 2016-03-07 MED ORDER — POLYETHYLENE GLYCOL 3350 17 G PO PACK
17.0000 g | PACK | Freq: Every day | ORAL | Status: DC | PRN
  Filled 2016-03-07: qty 1

## 2016-03-07 MED ORDER — VARENICLINE TARTRATE 1 MG PO TABS
1.0000 mg | ORAL_TABLET | Freq: Two times a day (BID) | ORAL | Status: DC
  Administered 2016-03-07 – 2016-03-09 (×5): 1 mg via ORAL
  Filled 2016-03-07 (×5): qty 1

## 2016-03-07 MED ORDER — ONDANSETRON HCL 4 MG/2ML IJ SOLN: 4.0000 mg | Freq: Once | INTRAMUSCULAR | Status: DC | PRN

## 2016-03-07 MED ORDER — LIDOCAINE-EPINEPHRINE (PF) 1 %-1:200000 IJ SOLN
INTRAMUSCULAR | Status: AC
  Filled 2016-03-07: qty 30

## 2016-03-07 MED ORDER — MIDAZOLAM HCL 5 MG/5ML IJ SOLN
INTRAMUSCULAR | Status: DC | PRN
  Administered 2016-03-07: 2 mg via INTRAVENOUS

## 2016-03-07 MED ORDER — DEXTROSE 5 % IV SOLN
500.0000 mg | Freq: Four times a day (QID) | INTRAVENOUS | Status: DC | PRN
  Filled 2016-03-07: qty 5

## 2016-03-07 MED ORDER — ALBUTEROL SULFATE (2.5 MG/3ML) 0.083% IN NEBU: 3.0000 mL | INHALATION_SOLUTION | Freq: Four times a day (QID) | RESPIRATORY_TRACT | Status: DC | PRN

## 2016-03-07 SURGICAL SUPPLY — 69 items
BANDAGE ACE 4X5 VEL STRL LF (GAUZE/BANDAGES/DRESSINGS) ×2 IMPLANT
BANDAGE ACE 6X5 VEL STRL LF (GAUZE/BANDAGES/DRESSINGS) ×2 IMPLANT
BANDAGE ESMARK 6X9 LF (GAUZE/BANDAGES/DRESSINGS) ×1 IMPLANT
BLADE SAGITTAL 25.0X1.19X90 (BLADE) ×2 IMPLANT
BLADE SAW SAG 90X13X1.27 (BLADE) ×2 IMPLANT
BLADE SURG 10 STRL SS (BLADE) ×2 IMPLANT
BLADE SURG 15 STRL LF DISP TIS (BLADE) ×1 IMPLANT
BLADE SURG 15 STRL SS (BLADE) ×1
BNDG ESMARK 6X9 LF (GAUZE/BANDAGES/DRESSINGS) ×2
BOWL SMART MIX CTS (DISPOSABLE) ×2 IMPLANT
CAP KNEE TOTAL 3 SIGMA ×2 IMPLANT
CEMENT HV SMART SET (Cement) ×4 IMPLANT
COVER SURGICAL LIGHT HANDLE (MISCELLANEOUS) ×2 IMPLANT
CUFF TOURNIQUET SINGLE 34IN LL (TOURNIQUET CUFF) ×2 IMPLANT
CUFF TOURNIQUET SINGLE 44IN (TOURNIQUET CUFF) IMPLANT
DRAPE INCISE IOBAN 66X45 STRL (DRAPES) ×2 IMPLANT
DRAPE ORTHO SPLIT 77X108 STRL (DRAPES) ×2
DRAPE SURG ORHT 6 SPLT 77X108 (DRAPES) ×2 IMPLANT
DRAPE U-SHAPE 47X51 STRL (DRAPES) ×2 IMPLANT
DRSG ADAPTIC 3X8 NADH LF (GAUZE/BANDAGES/DRESSINGS) ×2 IMPLANT
DRSG PAD ABDOMINAL 8X10 ST (GAUZE/BANDAGES/DRESSINGS) ×2 IMPLANT
DURAPREP 26ML APPLICATOR (WOUND CARE) ×2 IMPLANT
ELECT REM PT RETURN 9FT ADLT (ELECTROSURGICAL) ×2
ELECTRODE REM PT RTRN 9FT ADLT (ELECTROSURGICAL) ×1 IMPLANT
EVACUATOR 1/8 PVC DRAIN (DRAIN) IMPLANT
FACESHIELD WRAPAROUND (MASK) ×2 IMPLANT
FLOSEAL 10ML (HEMOSTASIS) IMPLANT
FLUID NSS /IRRIG 3000 ML XXX (IV SOLUTION) ×2 IMPLANT
GAUZE SPONGE 4X4 12PLY STRL (GAUZE/BANDAGES/DRESSINGS) ×2 IMPLANT
GLOVE BIOGEL PI IND STRL 8 (GLOVE) ×4 IMPLANT
GLOVE BIOGEL PI INDICATOR 8 (GLOVE) ×4
GLOVE ORTHO TXT STRL SZ7.5 (GLOVE) ×2 IMPLANT
GLOVE SURG ORTHO 8.0 STRL STRW (GLOVE) ×2 IMPLANT
GOWN STRL REUS W/ TWL LRG LVL3 (GOWN DISPOSABLE) ×2 IMPLANT
GOWN STRL REUS W/ TWL XL LVL3 (GOWN DISPOSABLE) ×1 IMPLANT
GOWN STRL REUS W/TWL 2XL LVL3 (GOWN DISPOSABLE) ×2 IMPLANT
GOWN STRL REUS W/TWL LRG LVL3 (GOWN DISPOSABLE) ×2
GOWN STRL REUS W/TWL XL LVL3 (GOWN DISPOSABLE) ×1
HANDPIECE INTERPULSE COAX TIP (DISPOSABLE) ×1
HOOD PEEL AWAY FACE SHEILD DIS (HOOD) ×2 IMPLANT
IMMOBILIZER KNEE 22 UNIV (SOFTGOODS) ×2 IMPLANT
KIT BASIN OR (CUSTOM PROCEDURE TRAY) ×2 IMPLANT
KIT ROOM TURNOVER OR (KITS) ×2 IMPLANT
MANIFOLD NEPTUNE II (INSTRUMENTS) ×2 IMPLANT
NEEDLE 22X1 1/2 (OR ONLY) (NEEDLE) ×4 IMPLANT
NS IRRIG 1000ML POUR BTL (IV SOLUTION) ×2 IMPLANT
PACK TOTAL JOINT (CUSTOM PROCEDURE TRAY) ×2 IMPLANT
PAD ARMBOARD 7.5X6 YLW CONV (MISCELLANEOUS) ×4 IMPLANT
PAD CAST 4YDX4 CTTN HI CHSV (CAST SUPPLIES) ×1 IMPLANT
PADDING CAST ABS 4INX4YD NS (CAST SUPPLIES) ×1
PADDING CAST ABS COTTON 4X4 ST (CAST SUPPLIES) ×1 IMPLANT
PADDING CAST COTTON 4X4 STRL (CAST SUPPLIES) ×1
PADDING CAST COTTON 6X4 STRL (CAST SUPPLIES) ×2 IMPLANT
SET HNDPC FAN SPRY TIP SCT (DISPOSABLE) ×1 IMPLANT
SPONGE GAUZE 4X4 12PLY STER LF (GAUZE/BANDAGES/DRESSINGS) ×2 IMPLANT
STAPLER VISISTAT 35W (STAPLE) ×2 IMPLANT
SUCTION FRAZIER HANDLE 10FR (MISCELLANEOUS) ×1
SUCTION TUBE FRAZIER 10FR DISP (MISCELLANEOUS) ×1 IMPLANT
SUT ETHIBOND NAB CT1 #1 30IN (SUTURE) ×6 IMPLANT
SUT VIC AB 0 CT1 27 (SUTURE) ×1
SUT VIC AB 0 CT1 27XBRD ANBCTR (SUTURE) ×1 IMPLANT
SUT VIC AB 2-0 CT1 27 (SUTURE) ×2
SUT VIC AB 2-0 CT1 TAPERPNT 27 (SUTURE) ×2 IMPLANT
SYR CONTROL 10ML LL (SYRINGE) ×4 IMPLANT
TOWEL OR 17X24 6PK STRL BLUE (TOWEL DISPOSABLE) ×2 IMPLANT
TOWEL OR 17X26 10 PK STRL BLUE (TOWEL DISPOSABLE) ×2 IMPLANT
TRAY CATH 16FR W/PLASTIC CATH (SET/KITS/TRAYS/PACK) IMPLANT
TRAY FOLEY CATH 16FRSI W/METER (SET/KITS/TRAYS/PACK) IMPLANT
WATER STERILE IRR 1000ML POUR (IV SOLUTION) IMPLANT

## 2016-03-07 NOTE — Anesthesia Preprocedure Evaluation (Addendum)
Anesthesia Evaluation  Patient identified by MRN, date of birth, ID band Patient awake    Reviewed: Allergy & Precautions, NPO status , Patient's Chart, lab work & pertinent test results  Airway Mallampati: II  TM Distance: >3 FB Neck ROM: Full    Dental  (+) Teeth Intact,    Pulmonary Current Smoker,    breath sounds clear to auscultation       Cardiovascular hypertension,  Rhythm:Regular Rate:Normal     Neuro/Psych    GI/Hepatic   Endo/Other    Renal/GU      Musculoskeletal   Abdominal   Peds  Hematology   Anesthesia Other Findings   Reproductive/Obstetrics                            Anesthesia Physical Anesthesia Plan  ASA: III  Anesthesia Plan: Regional and General   Post-op Pain Management:    Induction:   Airway Management Planned: Natural Airway and Nasal Cannula  Additional Equipment:   Intra-op Plan:   Post-operative Plan:   Informed Consent: I have reviewed the patients History and Physical, chart, labs and discussed the procedure including the risks, benefits and alternatives for the proposed anesthesia with the patient or authorized representative who has indicated his/her understanding and acceptance.     Plan Discussed with: CRNA and Anesthesiologist  Anesthesia Plan Comments:        Anesthesia Quick Evaluation

## 2016-03-07 NOTE — Anesthesia Procedure Notes (Signed)
Procedure Name: LMA Insertion Date/Time: 03/07/2016 7:39 AM Performed by: Marena ChancyBECKNER, Yoshito Gaza S Pre-anesthesia Checklist: Patient identified, Emergency Drugs available, Suction available and Patient being monitored Patient Re-evaluated:Patient Re-evaluated prior to inductionOxygen Delivery Method: Circle System Utilized and Circle system utilized Preoxygenation: Pre-oxygenation with 100% oxygen Intubation Type: IV induction Ventilation: Mask ventilation without difficulty LMA: LMA inserted LMA Size: 5.0 Number of attempts: 1 Airway Equipment and Method: Bite block Placement Confirmation: positive ETCO2 Tube secured with: Tape Dental Injury: Teeth and Oropharynx as per pre-operative assessment

## 2016-03-07 NOTE — Evaluation (Signed)
Physical Therapy Evaluation Patient Details Name: Todd Mercado MRN: [id number] DOB: July 20, 1960 Today's Date: 03/07/2016   History of Present Illness  55 yo admitted for Rt TKA. 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  Pt educated for knee precaution, transfers, gait and HEp with handout provided and bone foam in use end of session. Pt with decreased strength, gait and transfers who will benefit from acute therapy to maximize function and independence.     Follow Up Recommendations Home health PT    Equipment Recommendations  None recommended by PT    Recommendations for Other Services       Precautions / Restrictions Precautions Precautions: Knee Required Braces or Orthoses: Knee Immobilizer - Right Knee Immobilizer - Right: Other (comment) (for 24hr post sx) Restrictions Weight Bearing Restrictions: Yes RLE Weight Bearing: Weight bearing as tolerated      Mobility  Bed Mobility Overal bed mobility: Modified Independent                Transfers Overall transfer level: Needs assistance   Transfers: Sit to/from Stand Sit to Stand: Min guard         General transfer comment: cues for hand placement and safety  Ambulation/Gait Ambulation/Gait assistance: Min guard Ambulation Distance (Feet): 125 Feet Assistive device: Rolling walker (2 wheeled) Gait Pattern/deviations: Step-to pattern;Decreased stance time - right;Decreased dorsiflexion - right   Gait velocity interpretation: Below normal speed for age/gender General Gait Details: cues for position in RW, sequence, heel strike and posture  Stairs            Wheelchair Mobility    Modified Rankin (Stroke Patients Only)       Balance Overall balance assessment: No apparent balance deficits (not formally assessed)                                           Pertinent Vitals/Pain Pain  Assessment: 0-10 Pain Score: 8  Pain Location: right knee after gait Pain Intervention(s): Limited activity within patient's tolerance;Repositioned;Ice applied;Monitored during session;Premedicated before session    Home Living Family/patient expects to be discharged to:: Private residence Living Arrangements: Spouse/significant other Available Help at Discharge: Family;Available 24 hours/day Type of Home: House Home Access: Stairs to enter   [facility]-Number of Steps: 2 Home Layout: Two level;Able to live on main level with bedroom/bathroom;Laundry or work area in [employer]ement Home Equipment: Environmental consultantWalker - 2 wheels;Bedside commode      Prior Function Level of Independence: Independent               Hand Dominance        Extremity/Trunk Assessment   Upper Extremity Assessment: Overall WFL for tasks assessed           Lower Extremity Assessment: RLE deficits/detail RLE Deficits / Details: decreased ROM and strength as expected post op    Cervical / Trunk Assessment: Normal  Communication   Communication: No difficulties  Cognition Arousal/Alertness: Awake/alert Behavior During Therapy: WFL for tasks assessed/performed Overall Cognitive Status: Within Functional Limits for tasks assessed                      General Comments      Exercises Total Joint Exercises Ankle Circles/Pumps: AROM;Both;5 reps;Supine Quad Sets: AROM;Right;5 reps;Supine Heel Slides: AROM;Right;10 reps;Supine Hip ABduction/ADduction:  AROM;Right;10 reps;Supine Straight Leg Raises: AROM;Right;5 reps;Supine   Assessment/Plan    PT Assessment Patient needs continued PT services  PT Problem List Decreased strength;Decreased mobility;Decreased activity tolerance;Decreased knowledge of use of DME;Pain;Decreased knowledge of precautions;Decreased range of motion          PT Treatment Interventions DME instruction;Gait training;Stair training;Therapeutic exercise;Therapeutic  activities;Functional mobility training;Patient/family education    PT Goals (Current goals can be found in the Care Plan section)  Acute Rehab PT Goals Patient Stated Goal: return to work PT Goal Formulation: With patient Time For Goal Achievement: 03/14/16 Potential to Achieve Goals: Good    Frequency 7X/week   Barriers to discharge        Co-evaluation               End of Session Equipment Utilized During Treatment: Gait belt;Right knee immobilizer Activity Tolerance: Patient tolerated treatment well Patient left: in chair;with call bell/phone within reach;with nursing/sitter in room Nurse Communication: Mobility status;Weight bearing status         Time: [id number] PT Time Calculation (min) (ACUTE ONLY): 25 min   Charges:   PT Evaluation $PT Eval Moderate Complexity: 1 Procedure PT Treatments $Gait Training: 8-22 mins   PT G CodesDelorse Lek:        Tabor, Treshun Wold Beth 03/07/2016, 1:36 PM  Delaney MeigsMaija Tabor Trask Vosler, PT [phone]

## 2016-03-07 NOTE — Anesthesia Postprocedure Evaluation (Signed)
Anesthesia Post Note  Patient: Todd Mercado  Procedure(s) Performed: Procedure(s) (LRB): TOTAL KNEE ARTHROPLASTY (Right)  Patient location during evaluation: PACU Anesthesia Type: General and Regional Level of consciousness: awake, awake and alert and oriented Pain management: pain level controlled Vital Signs Assessment: post-procedure vital signs reviewed and stable Respiratory status: spontaneous breathing, nonlabored ventilation and respiratory function stable Anesthetic complications: no    Last Vitals:  Vitals:   03/07/16 1045 03/07/16 1111  BP:  132/87  Pulse:  69  Resp:  16  Temp: 36.4 C 36.3 C    Last Pain:  Vitals:   03/07/16 1224  TempSrc:   PainSc: 6                  Raydin Bielinski COKER

## 2016-03-07 NOTE — Discharge Instructions (Signed)
INSTRUCTIONS AFTER JOINT REPLACEMENT  ° °o Remove items at home which could result in a fall. This includes throw rugs or furniture in walking pathways °o ICE to the affected joint every three hours while awake for 30 minutes at a time, for at least the first 3-5 days, and then as needed for pain and swelling.  Continue to use ice for pain and swelling. You may notice swelling that will progress down to the foot and ankle.  This is normal after surgery.  Elevate your leg when you are not up walking on it.   °o Continue to use the breathing machine you got in the hospital (incentive spirometer) which will help keep your temperature down.  It is common for your temperature to cycle up and down following surgery, especially at night when you are not up moving around and exerting yourself.  The breathing machine keeps your lungs expanded and your temperature down. ° ° °DIET:  As you were doing prior to hospitalization, we recommend a well-balanced diet. ° °DRESSING / WOUND CARE / SHOWERING ° °You may change your dressing 3-5 days after surgery.  Then change the dressing every day with sterile gauze.  Please use good hand washing techniques before changing the dressing.  Do not use any lotions or creams on the incision until instructed by your surgeon. ° °ACTIVITY ° °o Increase activity slowly as tolerated, but follow the weight bearing instructions below.   °o No driving for 6 weeks or until further direction given by your physician.  You cannot drive while taking narcotics.  °o No lifting or carrying greater than 10 lbs. until further directed by your surgeon. °o Avoid periods of inactivity such as sitting longer than an hour when not asleep. This helps prevent blood clots.  °o You may return to work once you are authorized by your doctor.  ° ° ° °WEIGHT BEARING  ° °Weight bearing as tolerated with assist device (walker, cane, etc) as directed, use it as long as suggested by your surgeon or therapist, typically at  least 4-6 weeks. ° ° °EXERCISES ° °Results after joint replacement surgery are often greatly improved when you follow the exercise, range of motion and muscle strengthening exercises prescribed by your doctor. Safety measures are also important to protect the joint from further injury. Any time any of these exercises cause you to have increased pain or swelling, decrease what you are doing until you are comfortable again and then slowly increase them. If you have problems or questions, call your caregiver or physical therapist for advice.  ° °Rehabilitation is important following a joint replacement. After just a few days of immobilization, the muscles of the leg can become weakened and shrink (atrophy).  These exercises are designed to build up the tone and strength of the thigh and leg muscles and to improve motion. Often times heat used for twenty to thirty minutes before working out will loosen up your tissues and help with improving the range of motion but do not use heat for the first two weeks following surgery (sometimes heat can increase post-operative swelling).  ° °These exercises can be done on a training (exercise) mat, on the floor, on a table or on a bed. Use whatever works the best and is most comfortable for you.    Use music or television while you are exercising so that the exercises are a pleasant break in your day. This will make your life better with the exercises acting as a break   in your routine that you can look forward to.   Perform all exercises about fifteen times, three times per day or as directed.  You should exercise both the operative leg and the other leg as well. ° °Exercises include: °  °• Quad Sets - Tighten up the muscle on the front of the thigh (Quad) and hold for 5-10 seconds.   °• Straight Leg Raises - With your knee straight (if you were given a brace, keep it on), lift the leg to 60 degrees, hold for 3 seconds, and slowly lower the leg.  Perform this exercise against  resistance later as your leg gets stronger.  °• Leg Slides: Lying on your back, slowly slide your foot toward your buttocks, bending your knee up off the floor (only go as far as is comfortable). Then slowly slide your foot back down until your leg is flat on the floor again.  °• Angel Wings: Lying on your back spread your legs to the side as far apart as you can without causing discomfort.  °• Hamstring Strength:  Lying on your back, push your heel against the floor with your leg straight by tightening up the muscles of your buttocks.  Repeat, but this time bend your knee to a comfortable angle, and push your heel against the floor.  You may put a pillow under the heel to make it more comfortable if necessary.  ° °A rehabilitation program following joint replacement surgery can speed recovery and prevent re-injury in the future due to weakened muscles. Contact your doctor or a physical therapist for more information on knee rehabilitation.  ° ° °CONSTIPATION ° °Constipation is defined medically as fewer than three stools per week and severe constipation as less than one stool per week.  Even if you have a regular bowel pattern at home, your normal regimen is likely to be disrupted due to multiple reasons following surgery.  Combination of anesthesia, postoperative narcotics, change in appetite and fluid intake all can affect your bowels.  ° °YOU MUST use at least one of the following options; they are listed in order of increasing strength to get the job done.  They are all available over the counter, and you may need to use some, POSSIBLY even all of these options:   ° °Drink plenty of fluids (prune juice may be helpful) and high fiber foods °Colace 100 mg by mouth twice a day  °Senokot for constipation as directed and as needed Dulcolax (bisacodyl), take with full glass of water  °Miralax (polyethylene glycol) once or twice a day as needed. ° °If you have tried all these things and are unable to have a bowel  movement in the first 3-4 days after surgery call either your surgeon or your primary doctor.   ° °If you experience loose stools or diarrhea, hold the medications until you stool forms back up.  If your symptoms do not get better within 1 week or if they get worse, check with your doctor.  If you experience "the worst abdominal pain ever" or develop nausea or vomiting, please contact the office immediately for further recommendations for treatment. ° ° °ITCHING:  If you experience itching with your medications, try taking only a single pain pill, or even half a pain pill at a time.  You can also use Benadryl over the counter for itching or also to help with sleep.  ° °TED HOSE STOCKINGS:  Use stockings on both legs until for at least 2 weeks or as   directed by physician office. They may be removed at night for sleeping. ° °MEDICATIONS:  See your medication summary on the “After Visit Summary” that nursing will review with you.  You may have some home medications which will be placed on hold until you complete the course of blood thinner medication.  It is important for you to complete the blood thinner medication as prescribed. ° °PRECAUTIONS:  If you experience chest pain or shortness of breath - call 911 immediately for transfer to the hospital emergency department.  ° °If you develop a fever greater that 101 F, purulent drainage from wound, increased redness or drainage from wound, foul odor from the wound/dressing, or calf pain - CONTACT YOUR SURGEON.   °                                                °FOLLOW-UP APPOINTMENTS:  If you do not already have a post-op appointment, please call the office for an appointment to be seen by your surgeon.  Guidelines for how soon to be seen are listed in your “After Visit Summary”, but are typically between 1-4 weeks after surgery. ° °OTHER INSTRUCTIONS:  ° °Knee Replacement:  Do not place pillow under knee, focus on keeping the knee straight while resting. CPM  instructions: 0-90 degrees, 2 hours in the morning, 2 hours in the afternoon, and 2 hours in the evening. Place foam block, curve side up under heel at all times except when in CPM or when walking.  DO NOT modify, tear, cut, or change the foam block in any way. ° °MAKE SURE YOU:  °• Understand these instructions.  °• Get help right away if you are not doing well or get worse.  ° ° °Thank you for letting us be a part of your medical care team.  It is a privilege we respect greatly.  We hope these instructions will help you stay on track for a fast and full recovery!  ° °Information on my medicine - ELIQUIS® (apixaban) ° °This medication education was reviewed with me or my healthcare representative as part of my discharge preparation.  ° °Why was Eliquis® prescribed for you? °Eliquis® was prescribed for you to reduce the risk of blood clots forming after orthopedic surgery.   ° °What do You need to know about Eliquis®? °Take your Eliquis® TWICE DAILY - one tablet in the morning and one tablet in the evening with or without food.  It would be best to take the dose about the same time each day. ° °If you have difficulty swallowing the tablet whole please discuss with your pharmacist how to take the medication safely. ° °Take Eliquis® exactly as prescribed by your doctor and DO NOT stop taking Eliquis® without talking to the doctor who prescribed the medication.  Stopping without other medication to take the place of Eliquis® may increase your risk of developing a clot. ° °After discharge, you should have regular check-up appointments with your healthcare provider that is prescribing your Eliquis®. ° °What do you do if you miss a dose? °If a dose of ELIQUIS® is not taken at the scheduled time, take it as soon as possible on the same day and twice-daily administration should be resumed.  The dose should not be doubled to make up for a missed dose.  Do not take more than one tablet of   ELIQUIS at the same  time. ° °Important Safety Information °A possible side effect of Eliquis® is bleeding. You should call your healthcare provider right away if you experience any of the following: °? Bleeding from an injury or your nose that does not stop. °? Unusual colored urine (red or dark brown) or unusual colored stools (red or black). °? Unusual bruising for unknown reasons. °? A serious fall or if you hit your head (even if there is no bleeding). ° °Some medicines may interact with Eliquis® and might increase your risk of bleeding or clotting while on Eliquis®. To help avoid this, consult your healthcare provider or pharmacist prior to using any new prescription or non-prescription medications, including herbals, vitamins, non-steroidal anti-inflammatory drugs (NSAIDs) and supplements. ° °This website has more information on Eliquis® (apixaban): http://www.eliquis.com/eliquis/home ° °

## 2016-03-07 NOTE — Op Note (Signed)
NAMBecky Mercado:  [name], [name]                ACCOUNT NO.:  [id number]  MEDICAL RECORD NO.:  [id number]  LOCATION:  [facility]                         FACILITY:  [facility]  PHYSICIAN:  Dyke BrackettW. D. Melenie Minniear, M.D.    DATE OF BIRTH:  [birth year]  DATE OF PROCEDURE:  03/07/2016 DATE OF DISCHARGE:                              OPERATIVE REPORT   PREOPERATIVE DIAGNOSIS:  Severe osteoarthritis with varus deformity.  POSTOPERATIVE DIAGNOSIS:  Severe osteoarthritis with varus deformity.  OPERATION:  Right total knee replacement (Sigma size 4 femur tibia with 12.5 mm bearing and 38 mm all poly patella).  SURGEON:  Dyke BrackettW. D. Dorsey Charette, MD  ASSISTANVincent Peyer:  [place], [place]  ANESTHESIA:  General with adductor femoral nerve block, adductor block.  TOURNIQUET TIME:  61 minutes.  PROCEDURE IN DETAIL:  Sterile prep and drape, supine position, exsanguination of leg, inflation of tourniquet to 350.  Straight skin incision with medial parapatellar approach to the knee made.  We identified the distal femur, made a 5 degree valgus cut with 11 mm resection.  We followed this by cutting about 4 mm below the most diseased medial compartment with the extension gap measured at 12.5 mm. Significant amount of proliferative synovium was encountered and debrided and sent for biopsy as well.  Femur was sized to be a size 4, followed by placement of the provisional pin into the appropriate degree of external rotation followed by the all in 1 cutting block with anterior posterior chamfer cuts.  We then measured the flexion gap after completion of all cups at 12.5 mm.  PCL was released with posterior stripping of the capsule.  Tibia was sized to be a size 4 keel hole cup for the tibial trial, followed by the box cut on the femur.  The patient had a mild flexion contracture with varus deformity, this was basically 5 degree valgus with full extension with the 12.5 mm bearing.  Patella was cut leaving about 18 mm of native patella for a 38 mm all  poly trial.  We then trialed the knee with a 10 and a 12.5 and it was felt that he obtained full extension with 12.5, there was a slight toggle to valgus load with a 10, therefore we elected to use the 12.5 bearing. Exparel was injected in the capsule.  We inserted the cement in the doughy state with the tibia followed by femur patella, elected to use a trial bearing while the cement hardened.  Trial bearing was removed. Excess cement was removed from the posterior aspect of the knee. Tourniquet was released.  No excessive bleeding was noted.  Small bleeders were coagulated.  We then placed the final bearing and closed the capsule with #1 Ethibond, subcutaneous tissue with 2-0 Vicryl, skin with a stapling device, taken to recovery room in stable condition.  The extremity immobilizer applied.     Dyke BrackettW. D. Lacrecia Delval, M.D.     WDC/MEDQ  D:  03/07/2016  T:  03/07/2016  Job:  [id number]

## 2016-03-07 NOTE — Transfer of Care (Signed)
Immediate Anesthesia Transfer of Care Note  Patient: Todd Mercado  Procedure(s) Performed: Procedure(s): TOTAL KNEE ARTHROPLASTY (Right)  Patient Location: PACU  Anesthesia Type:General and GA combined with regional for post-op pain  Level of Consciousness: awake, alert  and oriented  Airway & Oxygen Therapy: Patient Spontanous Breathing and Patient connected to nasal cannula oxygen  Post-op Assessment: Report given to RN, Post -op Vital signs reviewed and stable and Patient moving all extremities X 4  Post vital signs: Reviewed and stable  Last Vitals:  Vitals:   03/07/16 0609 03/07/16 0615  BP: (!) 173/99   Pulse:    Resp:    Temp:  36.8 C    Last Pain:  Vitals:   03/07/16 0615  TempSrc: Oral  PainSc:       Patients Stated Pain Goal: 5 (03/07/16 0606)  Complications: No apparent anesthesia complications

## 2016-03-07 NOTE — Brief Op Note (Signed)
03/07/2016  9:42 AM  PATIENT:  Todd Mercado  55 y.o. male  PRE-OPERATIVE DIAGNOSIS:  OA RIGHT KNEE  POST-OPERATIVE DIAGNOSIS:  OA RIGHT KNEE  PROCEDURE:  Procedure(s): TOTAL KNEE ARTHROPLASTY (Right)  SURGEON:  Surgeon(s) and Role:    * Frederico Hammananiel Caffrey, MD - Primary  PHYSICIAN ASSISTANT: Margart SicklesJoshua Darean Rote, PA-C  ASSISTANTS:  ANESTHESIA:   local, regional and general  EBL:  Total I/O In: 1000 [I.V.:1000] Out: -   BLOOD ADMINISTERED:none  DRAINS: none   LOCAL MEDICATIONS USED:  OTHER exparil   SPECIMEN:  Source of Specimen:  right knee synovial biopsy  DISPOSITION OF SPECIMEN:  PATHOLOGY  COUNTS:  YES  TOURNIQUET:   Total Tourniquet Time Documented: Thigh (Right) - 61 minutes Total: Thigh (Right) - 61 minutes   DICTATION: .Other Dictation: Dictation Number unknown  PLAN OF CARE: Admit to inpatient   PATIENT DISPOSITION:  PACU - hemodynamically stable.   Delay start of Pharmacological VTE agent (>24hrs) due to surgical blood loss or risk of bleeding: yes

## 2016-03-07 NOTE — Progress Notes (Signed)
Orthopedic Tech Progress Note Patient Details:  Todd Mercado 05/07/1960 [id number] Viewed order from doctor's order list CPM Right Knee CPM Right Knee: On Right Knee Flexion (Degrees): 90 Right Knee Extension (Degrees): 0 Additional Comments: trapeze bar patient helper   Nikki DomCrawford, Trinitey Roache 03/07/2016, 10:28 AM

## 2016-03-07 NOTE — Progress Notes (Signed)
RT NOTE:  Pt has home CPAP at bedside. Pt stated he does not think he will wear it tonight. He is in a lot of pain and thinks it will be more bothersome tonight. Pt understands if he changes his mind to have RN please call RT.

## 2016-03-07 NOTE — H&P (View-Only) (Signed)
TOTAL KNEE ADMISSION H&P  Patient is being admitted for right total knee arthroplasty.  Subjective:  Chief Complaint:right knee pain.  HPI: Todd Mercado, 55 y.o. male, has a history of pain and functional disability in the right knee due to arthritis and has failed non-surgical conservative treatments for greater than 12 weeks to includeNSAID's and/or analgesics, corticosteriod injections, viscosupplementation injections and activity modification.  Onset of symptoms was gradual, starting 6 years ago with gradually worsening course since that time. The patient noted no past surgery on the right knee(s).  Patient currently rates pain in the right knee(s) at 10 out of 10 with activity. Patient has night pain, worsening of pain with activity and weight bearing, pain that interferes with activities of daily living, pain with passive range of motion, crepitus and joint swelling.  Patient has evidence of periarticular osteophytes and joint space narrowing by imaging studies.There is no active infection.  Patient Active Problem List   Diagnosis Date Noted  . Dyspnea 01/24/2016  . BMI 29.0-29.9,adult 01/24/2016  . OSA (obstructive sleep apnea) 01/24/2016  . 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 Medial meniscus tear 11/12/2015  . Effusion of right knee 10/30/2015  . Right knee pain 10/04/2015  . History of ETOH abuse 03/11/2013  . Essential hypertension, benign 02/11/2013  . Encounter for smoking cessation counseling 02/11/2013  . Insomnia 02/11/2013   Past Medical History:  Diagnosis Date  . Allergy   . Arthritis   . Cigar smoker motivated to quit   . Hypertension   . OSA (obstructive sleep apnea) 2004   CPAP recommended  . Wears dentures     Past Surgical History:  Procedure Laterality Date  . DENTAL SURGERY       (Not in a hospital admission) No Known Allergies  Social History  Substance Use Topics  . Smoking status: Current Every Day Smoker    Packs/day: 1.00    Years: 30.00    Types:  Cigarettes  . Smokeless tobacco: Never Used     Comment: discussed benefits of quitting  . Alcohol use 7.2 oz/week    12 Cans of beer per week     Comment: 12 beers on weekend    Family History  Problem Relation Age of Onset  . Hypertension Mother   . COPD Mother   . Arthritis Mother   . Heart disease Mother     atrial fibrillation  . Emphysema Father   . Early death Father   . Arthritis Maternal Aunt   . Diabetes Maternal Aunt   . COPD Maternal Uncle   . Heart failure Maternal Grandfather      Review of Systems  Genitourinary: Positive for frequency and urgency.  Musculoskeletal: Positive for joint pain.  All other systems reviewed and are negative.   Objective:  Physical Exam  Constitutional: He is oriented to person, place, and time. He appears well-developed and well-nourished. No distress.  HENT:  Head: Normocephalic and atraumatic.  Nose: Nose normal.  Eyes: Conjunctivae and EOM are normal. Pupils are equal, round, and reactive to light.  Neck: Normal range of motion. Neck supple.  Cardiovascular: Normal rate, regular rhythm, normal heart sounds and intact distal pulses.   Respiratory: Effort normal and breath sounds normal. No respiratory distress.  GI: Soft. Bowel sounds are normal. He exhibits no distension. There is no tenderness.  Musculoskeletal:       Right knee: He exhibits decreased range of motion and swelling. Tenderness found.  Also with LLE calf swelling pain    Lymphadenopathy:    He has no cervical adenopathy.  Neurological: He is alert and oriented to person, place, and time. No cranial nerve deficit.  Skin: Skin is warm and dry. No rash noted. No erythema.  Psychiatric: He has a normal mood and affect. His behavior is normal.    Vital signs in last 24 hours: @VSRANGES @  Labs:   Estimated body mass index is 29.7 kg/m as calculated from the following:   Height as of 02/18/16: 6' (1.829 m).   Weight as of 02/18/16: 99.3 kg (219  lb).   Imaging Review Plain radiographs demonstrate moderate degenerative joint disease of the right knee(s). The overall alignment issignificant varus. The bone quality appears to be good for age and reported activity level.  Assessment/Plan:  End stage arthritis, right knee   The patient history, physical examination, clinical judgment of the provider and imaging studies are consistent with end stage degenerative joint disease of the right knee(s) and total knee arthroplasty is deemed medically necessary. The treatment options including medical management, injection therapy arthroscopy and arthroplasty were discussed at length. The risks and benefits of total knee arthroplasty were presented and reviewed. The risks due to aseptic loosening, infection, stiffness, patella tracking problems, thromboembolic complications and other imponderables were discussed. The patient acknowledged the explanation, agreed to proceed with the plan and consent was signed. Patient is being admitted for inpatient treatment for surgery, pain control, PT, OT, prophylactic antibiotics, VTE prophylaxis, progressive ambulation and ADL's and discharge planning. The patient is planning to be discharged home with home health services

## 2016-03-07 NOTE — Interval H&P Note (Signed)
History and Physical Interval Note:  03/07/2016 7:26 AM  Todd Mercado  has presented today for surgery, with the diagnosis of OA RIGHT KNEE  The various methods of treatment have been discussed with the patient and family. After consideration of risks, benefits and other options for treatment, the patient has consented to  Procedure(s): TOTAL KNEE ARTHROPLASTY (Right) as a surgical intervention .  The patient's history has been reviewed, patient examined, no change in status, stable for surgery.  I have reviewed the patient's chart and labs.  Questions were answered to the patient's satisfaction.     Diavion Labrador JR,W D

## 2016-03-07 NOTE — Anesthesia Procedure Notes (Addendum)
Anesthesia Regional Block:  Adductor canal block  Pre-Anesthetic Checklist: ,, timeout performed, Correct Patient, Correct Site, Correct Laterality, Correct Procedure, Correct Position, site marked, Risks and benefits discussed,  Surgical consent,  Pre-op evaluation,  At surgeon's request and post-op pain management  Laterality: Right  Prep: chloraprep       Needles:  Injection technique: Single-shot  Needle Type: Stimulator Needle - 80     Needle Length: 9cm 9 cm Needle Gauge: 22 and 22 G    Additional Needles:  Procedures: ultrasound guided (picture in chart) Adductor canal block Narrative:  Start time: 03/07/2016 7:15 AM End time: 03/07/2016 7:20 AM Injection made incrementally with aspirations every 5 mL.  Performed by: Personally   Additional Notes: 20 cc 0.75% Ropivacaine injected easily

## 2016-03-08 LAB — CBC
HCT: 36 % — ABNORMAL LOW (ref 39.0–52.0)
Hemoglobin: 12 g/dL — ABNORMAL LOW (ref 13.0–17.0)
MCH: 29.6 pg (ref 26.0–34.0)
MCHC: 33.3 g/dL (ref 30.0–36.0)
MCV: 88.7 fL (ref 78.0–100.0)
PLATELETS: 153 10*3/uL (ref 150–400)
RBC: 4.06 MIL/uL — AB (ref 4.22–5.81)
RDW: 13.7 % (ref 11.5–15.5)
WBC: 10.4 10*3/uL (ref 4.0–10.5)

## 2016-03-08 LAB — BASIC METABOLIC PANEL
ANION GAP: 7 (ref 5–15)
BUN: 8 mg/dL (ref 6–20)
CO2: 28 mmol/L (ref 22–32)
Calcium: 8.4 mg/dL — ABNORMAL LOW (ref 8.9–10.3)
Chloride: 94 mmol/L — ABNORMAL LOW (ref 101–111)
Creatinine, Ser: 0.71 mg/dL (ref 0.61–1.24)
GLUCOSE: 129 mg/dL — AB (ref 65–99)
POTASSIUM: 3.9 mmol/L (ref 3.5–5.1)
Sodium: 129 mmol/L — ABNORMAL LOW (ref 135–145)

## 2016-03-08 NOTE — Progress Notes (Signed)
Physical Therapy Treatment Patient Details Name: Todd Mercado MRN: [id number] DOB: 02/12/1961 Today's Date: 03/08/2016    History of Present Illness 55 yo admitted for Rt TKA. PMhx: HTN, OSA    PT Comments    Pt continues to be limited by pain despite receiving IV pain medications prior to this session. Performed gait with increased antalgia noted with increased distance. Pt requires cues to maintain proper sequencing including knee flexion during swing on RLE. Initiated supine hip abd/add this session. Performed stair training, but pt will benefit from instruction with RW use next session.    Follow Up Recommendations  Home health PT     Equipment Recommendations  None recommended by PT    Recommendations for Other Services       Precautions / Restrictions Precautions Precautions: Knee Precaution Booklet Issued: No Precaution Comments: reviewed no pillow under knee with Pt Restrictions Weight Bearing Restrictions: Yes RLE Weight Bearing: Weight bearing as tolerated    Mobility  Bed Mobility Overal bed mobility: Needs Assistance Bed Mobility: Sit to Supine       Sit to supine: Min guard   General bed mobility comments: Min guard for safety. Able to maneuver RLE into bed with use of LLE  Transfers Overall transfer level: Needs assistance Equipment used: Rolling walker (2 wheeled) Transfers: Sit to/from Stand Sit to Stand: Min guard         General transfer comment: cues for hand placement and safety  Ambulation/Gait Ambulation/Gait assistance: Min guard Ambulation Distance (Feet): 75 Feet Assistive device: Rolling walker (2 wheeled) Gait Pattern/deviations: Step-to pattern;Decreased step length - left;Decreased stance time - right Gait velocity: decreased Gait velocity interpretation: Below normal speed for age/gender General Gait Details: cues for position in RW, sequence, heel strike and posture   Stairs Stairs: Yes Stairs assistance: Min  guard Stair Management: Two rails;Forwards Number of Stairs: 2 General stair comments: min guard with railings for safety  Wheelchair Mobility    Modified Rankin (Stroke Patients Only)       Balance Overall balance assessment: Needs assistance Sitting-balance support: No upper extremity supported Sitting balance-Leahy Scale: Good     Standing balance support: No upper extremity supported Standing balance-Leahy Scale: Fair Standing balance comment: Uses RW for stability, supervision for safety                    Cognition Arousal/Alertness: Awake/alert Behavior During Therapy: WFL for tasks assessed/performed Overall Cognitive Status: Within Functional Limits for tasks assessed                      Exercises Total Joint Exercises Ankle Circles/Pumps: AROM;Both;20 reps;Supine Quad Sets: AROM;Right;10 reps;Supine Heel Slides: AROM;Right;10 reps;Supine Hip ABduction/ADduction: AROM;Right;10 reps;Supine Goniometric ROM: 5-40    General Comments        Pertinent Vitals/Pain Pain Assessment: 0-10 Pain Score: 5  (5/10 at rest, reports 10/10 with activity) Pain Location: right knee Pain Descriptors / Indicators: Aching;Guarding;Sore Pain Intervention(s): Monitored during session;RN gave pain meds during session;Repositioned    Home Living                      Prior Function            PT Goals (current goals can now be found in the care plan section) Acute Rehab PT Goals Patient Stated Goal: return to work Progress towards PT goals: Progressing toward goals    Frequency    7X/week      PT  Plan Current plan remains appropriate    Co-evaluation             End of Session Equipment Utilized During Treatment: Gait belt Activity Tolerance: Patient limited by pain Patient left: in bed;with call bell/phone within reach     Time: 1511-1535 PT Time Calculation (min) (ACUTE ONLY): 24 min  Charges:  $Gait Training: 23-37  mins $Therapeutic Exercise: 8-22 mins                    G Codes:      Colin BroachSabra M. Grettel Rames PT, DPT  [phone]  03/08/2016, 2:41 PM

## 2016-03-08 NOTE — Evaluation (Signed)
Occupational Therapy Evaluation and Discharge Patient Details Name: Todd Mercado MRN: [id number] DOB: May 17, 1960 Today's Date: 03/08/2016    History of Present Illness 55 yo admitted for Rt TKA. PMhx: HTN, OSA   Clinical Impression   PTA Pt independent in ADL and mobility. Pt currently supervision for ADL (min A for LB ADL) and mobility with RW. Pt received all education, and will have strong family support upon dc. Pt with no further questions or concerns in self care tasks or transfers. Pt safe to d/c home with family support. No further OT needs at this time. Thank you for referral.      Follow Up Recommendations  No OT follow up;Supervision/Assistance - 24 hour    Equipment Recommendations  None recommended by OT (Pt has necessary DME)    Recommendations for Other Services       Precautions / Restrictions Precautions Precautions: Knee Precaution Booklet Issued: No Precaution Comments: reviewed no pillow under knee with Pt Required Braces or Orthoses: Knee Immobilizer - Right Knee Immobilizer - Right: Other (comment) (for 24 hours after surgery) Restrictions Weight Bearing Restrictions: Yes RLE Weight Bearing: Weight bearing as tolerated      Mobility Bed Mobility Overal bed mobility: Needs Assistance Bed Mobility: Supine to Sit     Supine to sit: Min assist;HOB elevated     General bed mobility comments: Physical Assist with RLE with KI on  Transfers Overall transfer level: Needs assistance Equipment used: Rolling walker (2 wheeled) Transfers: Sit to/from Stand Sit to Stand: Min guard         General transfer comment: cues for hand placement and safety    Balance Overall balance assessment: Needs assistance Sitting-balance support: No upper extremity supported;Feet supported Sitting balance-Leahy Scale: Fair     Standing balance support: No upper extremity supported;Bilateral upper extremity supported;During functional activity Standing  balance-Leahy Scale: Fair Standing balance comment: Use of RW for balance during mobility, able to stand at sink to perform ADL with no LOB and various levels of UE support                            ADL Overall ADL's : Needs assistance/impaired     Grooming: Wash/dry hands;Wash/dry face;Oral care;Supervision/safety;Standing Grooming Details (indicate cue type and reason): sink level Upper Body Bathing: Modified independent;Sitting Upper Body Bathing Details (indicate cue type and reason): reviewed use of 3 in 1 as shower chair Lower Body Bathing: Set up;Sitting/lateral leans Lower Body Bathing Details (indicate cue type and reason): Pt has long handle sponge Upper Body Dressing : Modified independent;Sitting   Lower Body Dressing: Minimal assistance;Sit to/from stand Lower Body Dressing Details (indicate cue type and reason): Pt reported that his brother helped him get his boxers over his feet, and then he was able to sit to stand to complete. Will have family help 24 hours upon dc Toilet Transfer: Supervision/safety;Ambulation;Comfort height toilet;RW StatisticianToilet Transfer Details (indicate cue type and reason): in bathroom, no BSC over toilet Toileting- Clothing Manipulation and Hygiene: Supervision/safety;Sit to/from stand Toileting - Clothing Manipulation Details (indicate cue type and reason): hospital gown and boxer briefs Tub/ Shower Transfer: Walk-in shower;Supervision/safety;With caregiver independent assisting;Ambulation;Rolling walker;3 in 1 Tub/Shower Transfer Details (indicate cue type and reason): edcuated Pt in safe shower sequence and to have caregiver present at first for safety. Functional mobility during ADLs: Min guard;Rolling walker (for safety)       Vision     Perception     Praxis  Pertinent Vitals/Pain Pain Assessment: 0-10 Pain Score: 10-Worst pain ever Pain Location: ankle, knee and thigh Pain Descriptors / Indicators:  Sore;Guarding;Grimacing;Discomfort;Throbbing Pain Intervention(s): Monitored during session;Premedicated before session;Repositioned;Ice applied     Hand Dominance Right   Extremity/Trunk Assessment Upper Extremity Assessment Upper Extremity Assessment: Overall WFL for tasks assessed   Lower Extremity Assessment Lower Extremity Assessment: RLE deficits/detail RLE Deficits / Details: decreased ROM and strength as expected post op   Cervical / Trunk Assessment Cervical / Trunk Assessment: Normal   Communication Communication Communication: No difficulties   Cognition Arousal/Alertness: Awake/alert Behavior During Therapy: WFL for tasks assessed/performed Overall Cognitive Status: Within Functional Limits for tasks assessed                     General Comments       Exercises       Shoulder Instructions      Home Living Family/patient expects to be discharged to:: Private residence Living Arrangements: Parent Available Help at Discharge: Family;Available 24 hours/day Type of Home: House Home Access: Stairs to enter Entergy CorporationEntrance Stairs-Number of Steps: 2 Entrance Stairs-Rails: None Home Layout: Two level;Able to live on main level with bedroom/bathroom     Bathroom Shower/Tub: Producer, television/film/videoWalk-in shower   Bathroom Toilet: Standard Bathroom Accessibility: Yes How Accessible: Accessible via walker Home Equipment: Walker - 2 wheels;Bedside commode   Additional Comments: 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      Prior Functioning/Environment Level of Independence: Independent                 OT Problem List: Decreased range of motion;Decreased strength;Decreased activity tolerance;Impaired balance (sitting and/or standing);Pain   OT Treatment/Interventions:      OT Goals(Current goals can be found in the care plan section) Acute Rehab OT Goals Patient Stated Goal: return to work OT Goal Formulation: With patient Time For Goal  Achievement: 03/15/16 Potential to Achieve Goals: Good  OT Frequency:     Barriers to D/C:            Co-evaluation              End of Session Equipment Utilized During Treatment: Gait belt;Rolling walker CPM Right Knee CPM Right Knee: Off Nurse Communication: Mobility status  Activity Tolerance: Patient tolerated treatment well Patient left: in chair;with call bell/phone within reach   Time: 0819-0907 OT Time Calculation (min): 48 min Charges:  OT General Charges $OT Visit: 1 Procedure OT Evaluation $OT Eval Low Complexity: 1 Procedure OT Treatments $Self Care/Home Management : 23-37 mins G-Codes:    Evern BioLaura J Derrika Ruffalo 03/08/2016, 9:31 AM  Sherryl MangesLaura Aydin Cavalieri OTR/L [phone]

## 2016-03-08 NOTE — Progress Notes (Signed)
Physical Therapy Treatment Patient Details Name: Todd Mercado MRN: [id number] DOB: March 06, 1961 Today's Date: 03/08/2016    History of Present Illness 55 yo admitted for Rt TKA. PMhx: HTN, OSA    PT Comments    Pt presents with increased C/O pain prior to therapy visit. Pt increased gait distance this visit but has increased antalgic gait with increased distance. ROM measurements taken and pt is limited with knee flexion due to pain. Instructed on HEP and encouraged compliance.    Follow Up Recommendations  Home health PT     Equipment Recommendations  None recommended by PT    Recommendations for Other Services       Precautions / Restrictions Precautions Precautions: Knee Precaution Booklet Issued: No Precaution Comments: reviewed no pillow under knee with Pt Restrictions Weight Bearing Restrictions: Yes RLE Weight Bearing: Weight bearing as tolerated    Mobility  Bed Mobility               General bed mobility comments: Pt in chair when PT arrives  Transfers Overall transfer level: Needs assistance Equipment used: Rolling walker (2 wheeled) Transfers: Sit to/from Stand Sit to Stand: Min guard         General transfer comment: cues for hand placement and safety  Ambulation/Gait Ambulation/Gait assistance: Min guard Ambulation Distance (Feet): 50 Feet Assistive device: Rolling walker (2 wheeled) Gait Pattern/deviations: Step-to pattern;Decreased step length - left;Decreased stance time - right;Antalgic Gait velocity: decreased Gait velocity interpretation: Below normal speed for age/gender General Gait Details: cues for position in RW, sequence, heel strike and posture   Stairs            Wheelchair Mobility    Modified Rankin (Stroke Patients Only)       Balance Overall balance assessment: Needs assistance Sitting-balance support: No upper extremity supported Sitting balance-Leahy Scale: Fair     Standing balance support: No upper  extremity supported Standing balance-Leahy Scale: Fair Standing balance comment: Uses RW for stability due to increased pain in Right knee                    Cognition Arousal/Alertness: Awake/alert Behavior During Therapy: WFL for tasks assessed/performed Overall Cognitive Status: Within Functional Limits for tasks assessed                      Exercises Total Joint Exercises Ankle Circles/Pumps: AROM;Both;20 reps;Supine Quad Sets: AROM;Right;10 reps;Supine Heel Slides: AROM;Right;10 reps;Supine Goniometric ROM: 5-40    General Comments        Pertinent Vitals/Pain Pain Assessment: 0-10 Pain Score: 10-Worst pain ever Pain Location: Right Knee Pain Descriptors / Indicators: Aching;Grimacing;Sore Pain Intervention(s): Monitored during session;Premedicated before session;Ice applied;Repositioned    Home Living                      Prior Function            PT Goals (current goals can now be found in the care plan section) Acute Rehab PT Goals Patient Stated Goal: return to work Progress towards PT goals: Progressing toward goals    Frequency    7X/week      PT Plan Current plan remains appropriate    Co-evaluation             End of Session Equipment Utilized During Treatment: Gait belt Activity Tolerance: Patient limited by pain Patient left: in chair;with call bell/phone within reach     Time: [id number] PT Time Calculation (min) (  ACUTE ONLY): 26 min  Charges:  $Gait Training: 8-22 mins $Therapeutic Exercise: 8-22 mins                    G Codes:      Colin BroachSabra M. Reg Bircher PT, DPT  [phone]  03/08/2016, 12:34 PM

## 2016-03-08 NOTE — Progress Notes (Signed)
Orthopedic Tech Progress Note Patient Details:  Todd Mercado December 07, 1960 [id number]  Patient ID: Todd Mercado, male   DOB: December 07, 1960, 55 y.o.   MRN: [id number]   Saul FordyceJennifer C Omare Bilotta 03/08/2016, 5:36 PMPatient refused CPM.

## 2016-03-08 NOTE — Care Management Note (Signed)
Case Management Note  Patient Details  Name: Todd Mercado MRN: [id number] Date of Birth: 04-Jul-1960  Subjective/Objective:    55 yr old gentleman s/p right total knee arthroplasty.            Action/Plan:  Case manager spoke with patient concerning Home Health and DME needs. Patient was preoperatively [facility] with [facility], no changes. He states he has rolling walker and 3in1, CPM will be delivered to the home at discharge. Patient states his mother will be assisting him .   Expected Discharge Date:   03/09/16               Expected Discharge Plan:  Home w Home Health Services  In-House Referral:  NA  Discharge planning Services  CM Consult  Post Acute Care Choice:  Home Health Choice offered to:  Patient  DME Arranged:  N/A DME Agency:  NA  HH Arranged:  PT HH Agency:  [facility]  Status of Service:  Completed, signed off  If discussed at Long Length of Stay Meetings, dates discussed:    Additional Comments:  Durenda GuthrieBrady, Rini Moffit Naomi, RN 03/08/2016, 11:31 AM

## 2016-03-08 NOTE — Progress Notes (Signed)
Subjective: 1 Day Post-Op Procedure(s) (LRB): TOTAL KNEE ARTHROPLASTY (Right) Patient reports pain as mild and moderate.    Objective: Vital signs in last 24 hours: Temp:  [97.2 F (36.2 C)-98.7 F (37.1 C)] 98.2 F (36.8 C) (11/04 0500) Pulse Rate:  [69-85] 84 (11/04 0500) Resp:  [16-19] 16 (11/04 0500) BP: (132-150)/(77-88) 150/77 (11/04 0500) SpO2:  [96 %-98 %] 96 % (11/04 0500)  Intake/Output from previous day: 11/03 0701 - 11/04 0700 In: 1275 [I.V.:1225; IV Piggyback:50] Out: 2350 [Urine:2200; Blood:150] Intake/Output this shift: No intake/output data recorded.   Recent Labs  03/08/16 0305  HGB 12.0*    Recent Labs  03/08/16 0305  WBC 10.4  RBC 4.06*  HCT 36.0*  PLT 153    Recent Labs  03/08/16 0305  NA 129*  K 3.9  CL 94*  CO2 28  BUN 8  CREATININE 0.71  GLUCOSE 129*  CALCIUM 8.4*   No results for input(s): LABPT, INR in the last 72 hours.  Neurovascular intact Sensation intact distally Intact pulses distally Dorsiflexion/Plantar flexion intact Incision: dressing C/D/I Compartment soft  Assessment/Plan: 1 Day Post-Op Procedure(s) (LRB): TOTAL KNEE ARTHROPLASTY (Right) Up with therapy Plan for discharge tomorrow Discharge home with home health tomorrow wbat rle Pain med as ordered eliquis dvt proph   Todd Mercado 03/08/2016, 8:18 AM

## 2016-03-09 LAB — CBC
HCT: 37.4 % — ABNORMAL LOW (ref 39.0–52.0)
Hemoglobin: 12.3 g/dL — ABNORMAL LOW (ref 13.0–17.0)
MCH: 29.7 pg (ref 26.0–34.0)
MCHC: 32.9 g/dL (ref 30.0–36.0)
MCV: 90.3 fL (ref 78.0–100.0)
Platelets: 158 10*3/uL (ref 150–400)
RBC: 4.14 MIL/uL — ABNORMAL LOW (ref 4.22–5.81)
RDW: 14.2 % (ref 11.5–15.5)
WBC: 10.1 10*3/uL (ref 4.0–10.5)

## 2016-03-09 NOTE — Discharge Summary (Signed)
PATIENT ID: Todd Mercado        MRN:  [id number]          DOB/AGE: November 17, 1960 / 55 y.o.    DISCHARGE SUMMARY  ADMISSION DATE:    03/07/2016 DISCHARGE DATE:   03/09/2016   ADMISSION DIAGNOSIS: OA RIGHT KNEE    DISCHARGE DIAGNOSIS:  OA RIGHT KNEE    ADDITIONAL DIAGNOSIS: Active Problems:   Primary localized osteoarthritis of right knee  Past Medical History:  Diagnosis Date  . Allergy   . Arthritis   . Cigar smoker motivated to quit   . Hypertension   . OSA (obstructive sleep apnea) 2004   CPAP recommended- to meet with home health 02/26/16  . Wears dentures     PROCEDURE: Procedure(s): TOTAL KNEE ARTHROPLASTY Right on 03/07/2016  CONSULTS: pt/ot    HISTORY:  See H&P in chart  HOSPITAL COURSE:  Todd Mercado is a 55 y.o. admitted on 03/07/2016 and found to have a diagnosis of OA RIGHT KNEE.  After appropriate laboratory studies were obtained  they were taken to the operating room on 03/07/2016 and underwent  Procedure(s): TOTAL KNEE ARTHROPLASTY  Right.   They were given perioperative antibiotics:  Anti-infectives    Start     Dose/Rate Route Frequency Ordered Stop   03/07/16 1600  ceFAZolin (ANCEF) IVPB 1 g/50 mL premix     1 g 100 mL/hr over 30 Minutes Intravenous Every 6 hours 03/07/16 1132 03/07/16 2218   03/07/16 0552  ceFAZolin (ANCEF) 2-4 GM/100ML-% IVPB    Comments:  Edwina BarthHawks, Pamela   : cabinet override      03/07/16 0552 03/07/16 0746   03/07/16 0551  ceFAZolin (ANCEF) IVPB 2g/100 mL premix     2 g 200 mL/hr over 30 Minutes Intravenous On call to O.R. 03/07/16 [id number] 03/07/16 0746    .  Tolerated the procedure well.    POD #1, allowed out of bed to a chair.  PT for ambulation and exercise program. IV saline locked.  O2 discontionued.  POD #2, continued PT and ambulation.   .  The remainder of the hospital course was dedicated to ambulation and strengthening.   The patient was discharged on 2 Days Post-Op in  Stable condition.  Blood products  given:none  DIAGNOSTIC STUDIES: Recent vital signs: Patient Vitals for the past 24 hrs:  BP Temp Temp src Pulse Resp SpO2  03/09/16 0502 133/80 98.6 F (37 C) Oral 86 16 96 %  03/09/16 0034 - 99.5 F (37.5 C) Oral - - -  03/08/16 2107 (!) 142/83 (!) 102 F (38.9 C) Oral 96 16 94 %  03/08/16 1605 (!) 142/82 98.7 F (37.1 C) Oral 82 16 96 %       Recent laboratory studies:  Recent Labs  03/08/16 0305 03/09/16 0622  WBC 10.4 10.1  HGB 12.0* 12.3*  HCT 36.0* 37.4*  PLT 153 158    Recent Labs  03/08/16 0305  NA 129*  K 3.9  CL 94*  CO2 28  BUN 8  CREATININE 0.71  GLUCOSE 129*  CALCIUM 8.4*   Lab Results  Component Value Date   INR 1.01 02/25/2016     Recent Radiographic Studies :  Dg Chest 2 View  Result Date: 02/25/2016 CLINICAL DATA:  Preop for knee arthroplasty. EXAM: CHEST  2 VIEW COMPARISON:  None. FINDINGS: The heart size and mediastinal contours are within normal limits. Both lungs are clear. No pneumothorax or pleural effusion is noted. The visualized skeletal  structures are unremarkable. IMPRESSION: No active cardiopulmonary disease. Electronically Signed   By: Lupita RaiderJames  Green Jr, M.D.   On: 02/25/2016 15:57    DISCHARGE INSTRUCTIONS:   DISCHARGE MEDICATIONS:     Medication List    TAKE these medications   acetaminophen 500 MG tablet Commonly known as:  TYLENOL Take 1,000 mg by mouth every 6 (six) hours as needed for moderate pain.   albuterol 108 (90 Base) MCG/ACT inhaler Commonly known as:  PROVENTIL HFA;VENTOLIN HFA Inhale 2 puffs into the lungs every 6 (six) hours as needed for wheezing or shortness of breath.   apixaban 2.5 MG Tabs tablet Commonly known as:  ELIQUIS Take 1 tablet (2.5 mg total) by mouth 2 (two) times daily.   budesonide-formoterol 160-4.5 MCG/ACT inhaler Commonly known as:  SYMBICORT Inhale 2 puffs into the lungs 2 (two) times daily.   lisinopril 10 MG tablet Commonly known as:  PRINIVIL,ZESTRIL Take 1 tablet (10 mg  total) by mouth daily.   methocarbamol 500 MG tablet Commonly known as:  ROBAXIN Take 1 tablet (500 mg total) by mouth every 6 (six) hours as needed for muscle spasms.   nicotine 14 mg/24hr patch Commonly known as:  NICODERM CQ - dosed in mg/24 hours Place 1 patch (14 mg total) onto the skin daily.   oxyCODONE-acetaminophen 5-325 MG tablet Commonly known as:  ROXICET Take 1-2 tablets by mouth every 4 (four) hours as needed for moderate pain or severe pain.   varenicline 0.5 MG tablet Commonly known as:  CHANTIX Day 1-3 0.5mg  QD, day 4-7 0.5mg  BID, then 1 mg BID What changed:  how much to take  how to take this  when to take this  additional instructions       FOLLOW UP VISIT:   Follow-up Information    CAFFREY JR,W D, MD. Schedule an appointment as soon as possible for a visit in 2 week(s).   Specialty:  Orthopedic Surgery Contact information: [address] [place] [address] [phone]        [facility] .   Specialty:  Home Health Services Why:  Someone from [facility] will contact you to arrange start date and time for therapy. Contact information: [address] [place] [address] [phone]           DISPOSITION:   Home  CONDITION:  Stable   Margart SicklesJoshua Olie Scaffidi, PA-C  03/09/2016 10:18 AM

## 2016-03-09 NOTE — Progress Notes (Signed)
Orthopedic Tech Progress Note Patient Details:  Todd Mercado Apr 23, 1961 [id number]  Patient ID: Todd Mercado, male   DOB: Apr 23, 1961, 55 y.o.   MRN: [id number] Pt refused cpm   Trinna PostMartinez, Thaddeus Evitts J 03/09/2016, 5:52 AM

## 2016-03-09 NOTE — Progress Notes (Signed)
Physical Therapy Treatment Note  PT Comments: Pt presented to PT seated in a chair, willing to participate. Pt was able to walk household distances, and completed stair training with brother's assistance. Pt needs continuing work on knee ROM; taught self active assisted ROM exercises into knee flexion. OK for dc home from PT standpoint.    03/09/16 1100  PT Visit Information  Last PT Received On 03/09/16  Assistance Needed +1  History of Present Illness 55 yo admitted for Rt TKA. PMhx: HTN, OSA  Subjective Data  Subjective Pt states that he is sore.   Patient Stated Goal return to work  Precautions  Precautions Knee  Precaution Booklet Issued No  Precaution Comments reviewed no pillow under knee with Pt  Required Braces or Orthoses Other Brace/Splint (Bone Foam)  Restrictions  Weight Bearing Restrictions Yes  RLE Weight Bearing WBAT  Pain Assessment  Pain Assessment 0-10  Pain Score 10  Pain Location Right knee; started as a 0, after session was at a 10  Pain Descriptors / Indicators Aching;Grimacing;Guarding;Sore  Pain Intervention(s) Premedicated before session;Limited activity within patient's tolerance;Monitored during session  Cognition  Arousal/Alertness Awake/alert  Behavior During Therapy WFL for tasks assessed/performed  Overall Cognitive Status Within Functional Limits for tasks assessed  Bed Mobility  General bed mobility comments Pt in chair when PT arrives  Transfers  Overall transfer level   Equipment used Rolling walker (2 wheeled)  Transfers Sit to/from Stand  Sit to Stand Supervision  General transfer comment cues for hand placement and safety  Ambulation/Gait  Ambulation/Gait assistance Supervision  Ambulation Distance (Feet) 125 Feet  Assistive device Rolling walker (2 wheeled)  Gait Pattern/deviations Step-to pattern;Step-through pattern;Decreased stance time - right  General Gait Details cues for posture and heel strike, and to increase L step length  for a more efficient gait pattern  Gait velocity decreased  Gait velocity interpretation Below normal speed for age/gender  Stairs Yes  Stairs assistance Min guard  Stair Management Two rails;No rails;Backwards;Forwards;With walker  Number of Stairs 2  General stair comments min guard with railings for safety; practiced backwards with RW, educated brother to provide correct assist, handout given  Balance  Overall balance assessment Needs assistance  Sitting-balance support No upper extremity supported  Sitting balance-Leahy Scale Good  Standing balance support No upper extremity supported  Standing balance-Leahy Scale Fair  Standing balance comment Use of RW for balance during mobility  Exercises  Exercises Total Joint  Total Joint Exercises  Seated Knee Flexion AROM;AAROM;Right;10 reps;Seated  Straight Leg Raises AAROM;Right;Seated (1 rep tolerated)  PT - End of Session  Activity Tolerance Patient limited by pain  Patient left in chair;with family/visitor present;with call bell/phone within reach  PT - Assessment/Plan  PT Plan Current plan remains appropriate  PT Frequency (ACUTE ONLY) 7X/week  Follow Up Recommendations Home health PT  PT equipment None recommended by PT  PT Goal Progression  Progress towards PT goals Goals met/education completed, patient discharged from PT  PT Time Calculation  PT Start Time (ACUTE ONLY) 1054  PT Stop Time (ACUTE ONLY) 1120  PT Time Calculation (min) (ACUTE ONLY) 26 min  PT General Charges  $$ ACUTE PT VISIT 1 Procedure  PT Treatments  $Gait Training 8-22 mins  $Therapeutic Exercise 8-22 mins     Roney Marion, [place] Pager [phone] Office [phone]

## 2016-03-10 ENCOUNTER — Encounter (HOSPITAL_COMMUNITY): Payer: Self-pay | Admitting: Orthopedic Surgery

## 2016-03-19 ENCOUNTER — Other Ambulatory Visit: Payer: Self-pay | Admitting: *Deleted

## 2016-03-19 DIAGNOSIS — Z716 Tobacco abuse counseling: Secondary | ICD-10-CM

## 2016-03-19 DIAGNOSIS — F17210 Nicotine dependence, cigarettes, uncomplicated: Secondary | ICD-10-CM

## 2016-03-19 NOTE — Telephone Encounter (Signed)
Pharmacy request for chantix refill received . Do You want to refill this medication ? Please advise

## 2016-03-20 ENCOUNTER — Other Ambulatory Visit: Payer: Self-pay | Admitting: Family Medicine

## 2016-03-20 DIAGNOSIS — F17210 Nicotine dependence, cigarettes, uncomplicated: Secondary | ICD-10-CM

## 2016-03-20 DIAGNOSIS — Z716 Tobacco abuse counseling: Secondary | ICD-10-CM

## 2016-03-20 MED ORDER — VARENICLINE TARTRATE 1 MG PO TABS: 1.0000 mg | ORAL_TABLET | Freq: Two times a day (BID) | ORAL | 1 refills | Status: DC

## 2016-03-20 NOTE — Progress Notes (Signed)
Refills (x2) on chantix prescribed, for full 12 week treatment.

## 2016-04-18 ENCOUNTER — Encounter: Payer: Self-pay | Admitting: Pulmonary Disease

## 2016-06-06 ENCOUNTER — Ambulatory Visit: Payer: BLUE CROSS/BLUE SHIELD | Admitting: Pulmonary Disease

## 2016-09-24 ENCOUNTER — Other Ambulatory Visit: Payer: Self-pay | Admitting: *Deleted

## 2016-09-24 MED ORDER — ALBUTEROL SULFATE HFA 108 (90 BASE) MCG/ACT IN AERS: 2.0000 | INHALATION_SPRAY | Freq: Four times a day (QID) | RESPIRATORY_TRACT | 0 refills | Status: DC | PRN

## 2017-02-22 ENCOUNTER — Other Ambulatory Visit: Payer: Self-pay | Admitting: Internal Medicine

## 2017-02-23 NOTE — Telephone Encounter (Signed)
Please review for refill. Thanks!  

## 2017-04-12 ENCOUNTER — Other Ambulatory Visit: Payer: Self-pay | Admitting: Internal Medicine

## 2017-05-05 IMAGING — CR DG CHEST 2 VIEW
2 series · 2 of 2 positions shown · non-contrast
Comparison: None.

CLINICAL DATA: Preop for knee arthroplasty.

EXAM:
CHEST  2 VIEW

[w chest pa]
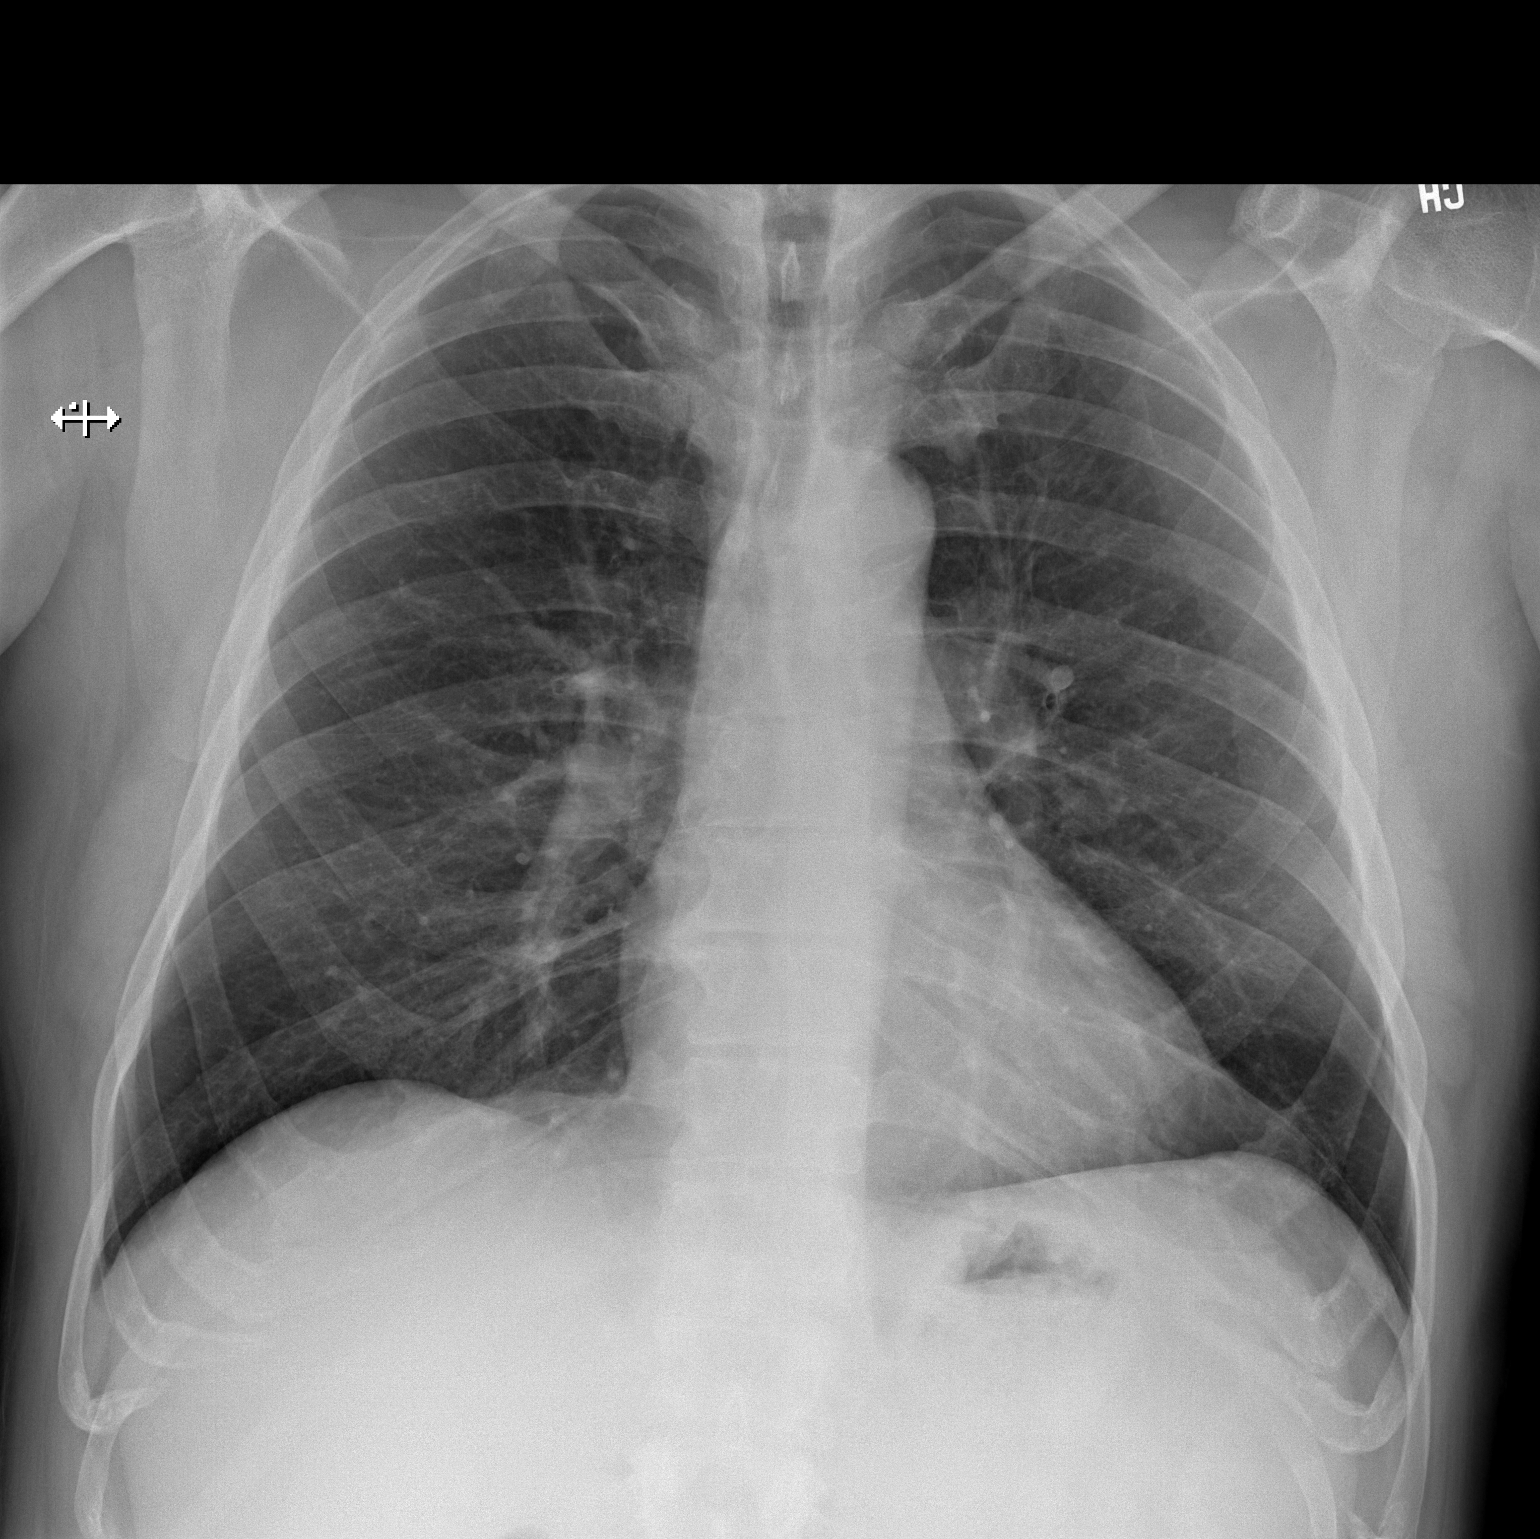

[w chest lat]
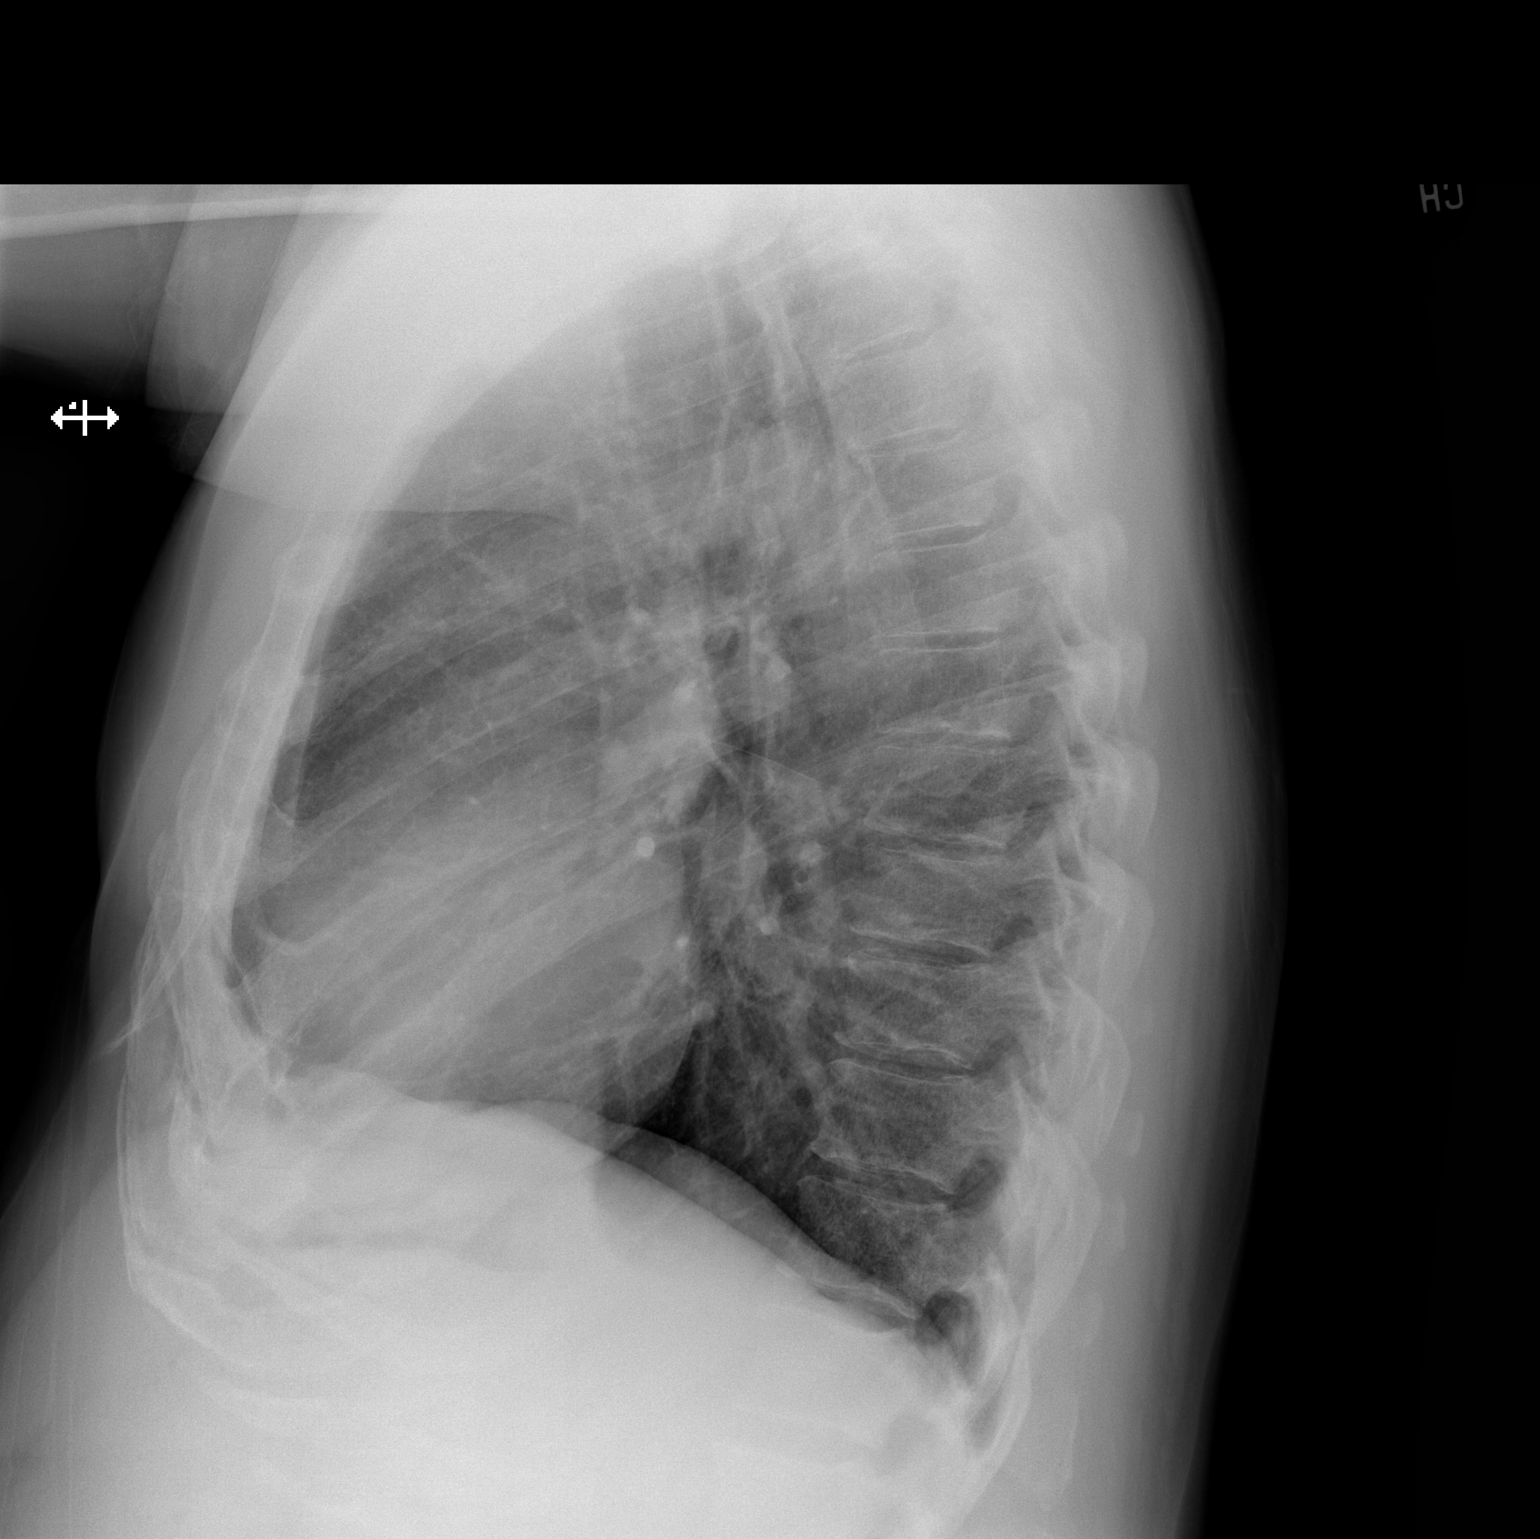

[2 of 2 positions shown; findings below may reference images not displayed]

FINDINGS: The heart size and mediastinal contours are within normal limits.
Both lungs are clear. No pneumothorax or pleural effusion is noted.
The visualized skeletal structures are unremarkable.
IMPRESSION: No active cardiopulmonary disease.

## 2017-07-17 ENCOUNTER — Other Ambulatory Visit: Payer: Self-pay | Admitting: Internal Medicine

## 2017-07-17 MED ORDER — LISINOPRIL 10 MG PO TABS: 10.0000 mg | ORAL_TABLET | Freq: Every day | ORAL | 0 refills | Status: DC

## 2017-08-28 ENCOUNTER — Other Ambulatory Visit: Payer: Self-pay | Admitting: Internal Medicine

## 2017-08-28 NOTE — Telephone Encounter (Signed)
Please review for refill. Thanks!  

## 2017-09-01 ENCOUNTER — Telehealth: Payer: Self-pay | Admitting: Family Medicine

## 2017-09-01 ENCOUNTER — Encounter: Payer: Self-pay | Admitting: Family Medicine

## 2017-09-01 ENCOUNTER — Ambulatory Visit (INDEPENDENT_AMBULATORY_CARE_PROVIDER_SITE_OTHER): Payer: Self-pay | Admitting: Family Medicine

## 2017-09-01 VITALS — BP 142/88 | HR 79 | Temp 98.0°F | Ht 72.0 in | Wt 217.8 lb

## 2017-09-01 DIAGNOSIS — E663 Overweight: Secondary | ICD-10-CM

## 2017-09-01 DIAGNOSIS — G4733 Obstructive sleep apnea (adult) (pediatric): Secondary | ICD-10-CM

## 2017-09-01 DIAGNOSIS — I10 Essential (primary) hypertension: Secondary | ICD-10-CM

## 2017-09-01 DIAGNOSIS — J439 Emphysema, unspecified: Secondary | ICD-10-CM

## 2017-09-01 DIAGNOSIS — M1711 Unilateral primary osteoarthritis, right knee: Secondary | ICD-10-CM

## 2017-09-01 MED ORDER — ALBUTEROL SULFATE HFA 108 (90 BASE) MCG/ACT IN AERS: 2.0000 | INHALATION_SPRAY | Freq: Four times a day (QID) | RESPIRATORY_TRACT | 0 refills | Status: DC | PRN

## 2017-09-01 MED ORDER — LISINOPRIL 10 MG PO TABS: 10.0000 mg | ORAL_TABLET | Freq: Every day | ORAL | 0 refills | Status: DC

## 2017-09-01 MED ORDER — BUDESONIDE-FORMOTEROL FUMARATE 160-4.5 MCG/ACT IN AERO: 2.0000 | INHALATION_SPRAY | Freq: Two times a day (BID) | RESPIRATORY_TRACT | 0 refills | Status: DC

## 2017-09-01 NOTE — Patient Instructions (Addendum)
I have refilled your inhalers. Start zyrtec one tab nightly also.  Lisinopril 10 mg prescribed. Make a nurse visit in 1 week to have BP rechecked (make sure you took your med at least 2 hours prior).  Once I get your lab results, I will call in the med for your knee pain.  Check with insurance on chantix vs Zyban coverage for smoking cessation.    Please help [place] help you:  We are honored you have chosen [facility] for your Primary Care home. Below you will find basic instructions that you may need to access in the future. Please help [place] help you by reading the instructions, which cover many of the frequent questions we experience.   Prescription refills and request:  -In order to allow more efficient response time, please call your pharmacy for all refills. They will forward the request electronically to [place]. This allows for the quickest possible response. Request left on a nurse line can take longer to refill, since these are checked as time allows between office patients and other phone calls.  - refill request can take up to 3-5 working days to complete.  - If request is sent electronically and request is appropiate, it is usually completed in 1-2 business days.  - all patients will need to be seen routinely for all chronic medical conditions requiring prescription medications (see follow-up below). If you are overdue for follow up on your condition, you will be asked to make an appointment and we will call in enough medication to cover you until your appointment (up to 30 days).  - all controlled substances will require a face to face visit to request/refill.  - if you desire your prescriptions to go through a new pharmacy, and have an active script at original pharmacy, you will need to call your pharmacy and have scripts transferred to new pharmacy. This is completed between the pharmacy locations and not by your provider.    Results: If any images or labs were ordered, it can take up  to 1 week to get results depending on the test ordered and the lab/facility running and resulting the test. - Normal or stable results, which do not need further discussion, may be released to your mychart immediately with attached note to you. A call may not be generated for normal results. Please make certain to sign up for mychart. If you have questions on how to activate your mychart you can call the front office.  - If your results need further discussion, our office will attempt to contact you via phone, and if unable to reach you after 2 attempts, we will release your abnormal result to your mychart with instructions.  - All results will be automatically released in mychart after 1 week.  - Your provider will provide you with explanation and instruction on all relevant material in your results. Please keep in mind, results and labs may appear confusing or abnormal to the untrained eye, but it does not mean they are actually abnormal for you personally. If you have any questions about your results that are not covered, or you desire more detailed explanation than what was provided, you should make an appointment with your provider to do so.   Our office handles many outgoing and incoming calls daily. If we have not contacted you within 1 week about your results, please check your mychart to see if there is a message first and if not, then contact our office.  In helping with  this matter, you help decrease call volume, and therefore allow [place] to be able to respond to patients needs more efficiently.   Acute office visits (sick visit):  An acute visit is intended for a new problem and are scheduled in shorter time slots to allow schedule openings for patients with new problems. This is the appropriate visit to discuss a new problem. In order to provide you with excellent quality medical care with proper time for you to explain your problem, have an exam and receive treatment with instructions, these  appointments should be limited to one new problem per visit. If you experience a new problem, in which you desire to be addressed, please make an acute office visit, we save openings on the schedule to accommodate you. Please do not save your new problem for any other type of visit, let us take care of it properly and quickly for you.   Follow up visits:  Depending on your condition(s) your provider will need to see you routinely in order to provide you with quality care and prescribe medication(s). Most chronic conditions (Example: hypertension, Diabetes, depression/anxiety... etc), require visits a couple times a year. Your provider will instruct you on proper follow up for your personal medical conditions and history. Please make certain to make follow up appointments for your condition as instructed. Failing to do so could result in lapse in your medication treatment/refills. If you request a refill, and are overdue to be seen on a condition, we will always provide you with a 30 day script (once) to allow you time to schedule.    Medicare wellness (well visit): - we have a wonderful Nurse Selena Batten), that will meet with you and provide you will yearly medicare wellness visits. These visits should occur yearly (can not be scheduled less than 1 calendar year apart) and cover preventive health, immunizations, advance directives and screenings you are entitled to yearly through your medicare benefits. Do not miss out on your entitled benefits, this is when medicare will pay for these benefits to be ordered for you.  These are strongly encouraged by your provider and is the appropriate type of visit to make certain you are up to date with all preventive health benefits. If you have not had your medicare wellness exam in the last 12 months, please make certain to schedule one by calling the office and schedule your medicare wellness with Selena Batten as soon as possible.   Yearly physical (well visit):  - Adults are  recommended to be seen yearly for physicals. Check with your insurance and date of your last physical, most insurances require one calendar year between physicals. Physicals include all preventive health topics, screenings, medical exam and labs that are appropriate for gender/age and history. You may have fasting labs needed at this visit. This is a well visit (not a sick visit), new problems should not be covered during this visit (see acute visit).  - Pediatric patients are seen more frequently when they are younger. Your provider will advise you on well child visit timing that is appropriate for your their age. - This is not a medicare wellness visit. Medicare wellness exams do not have an exam portion to the visit. Some medicare companies allow for a physical, some do not allow a yearly physical. If your medicare allows a yearly physical you can schedule the medicare wellness with our nurse Selena Batten and have your physical with your provider after, on the same day. Please check with insurance for your full benefits.  Late Policy/No Shows:  - all new patients should arrive 15-30 minutes earlier than appointment to allow [place] time  to  obtain all personal demographics,  insurance information and for you to complete office paperwork. - All established patients should arrive 10-15 minutes earlier than appointment time to update all information and be checked in .  - In our best efforts to run on time, if you are late for your appointment you will be asked to either reschedule or if able, we will work you back into the schedule. There will be a wait time to work you back in the schedule,  depending on availability.  - If you are unable to make it to your appointment as scheduled, please call 24 hours ahead of time to allow [place] to fill the time slot with someone else who needs to be seen. If you do not cancel your appointment ahead of time, you may be charged a no show fee.

## 2017-09-01 NOTE — Telephone Encounter (Signed)
Copied from CRM [id number]. Topic: Quick Communication - See Telephone Encounter >> Sep 01, 2017  5:20 PM Lorrine Kin, [place] wrote: CRM for notification. See Telephone encounter for: 09/01/17. Patient calling and states that Dr Claiborne Billings prescribed Symbicort at his visit today(09/01/17), states that the pharmacy did not have this rx for him. Upon looking at the med list, it states they were supposed to be 2 samples given to him at the office for him to try. Please advise. Patient says he can come by tomorrow to pick them up. Please give him a call when those are ready for pick up. CB#: [phone]

## 2017-09-01 NOTE — Progress Notes (Signed)
Patient ID: Todd Mercado, male   DOB: November 11, 1960, 57 y.o.   MRN: [id number]    Todd Mercado , [birth year], 57 y.o., male MRN: [id number]   Patient Care Team    Relationship Specialty Notifications Start End  Ma Hillock, DO PCP - General Family Medicine  07/02/15   Earlie Server, MD Consulting Physician Orthopedic Surgery  09/04/17      Chief Complaint  Patient presents with  . Follow-up    HTN    Subjective:  Hypertension: Pt reports compliance with lisinopril 10 mg daily. Blood pressures ranges at home not routinely checked. Patient denies chest pain, shortness of breath or lower extremity edema. Pt does not take a daily baby ASA. Pt is not prescribed statin. BMP: 09/01/2017 creatinine 0.99 CBC: 09/01/2017 within normal limits TSH: 09/01/2017 1.06 Diet: No routine diet Exercise: No routine exercise RF: Hypertension, overweight, smoker, family history present  OSA/pulmonary emphysema/shortness of breath: Patient reports he has been out of it his inhalers for some time.  He feels short of breath today.  His allergies are acting up with all the increase in pollen.  Right knee pain/osteoarthritis: Patient has had a right total knee arthroplasty November 2017.  He reports he has ongoing right knee pain.  He denies any recent injury or overactivity.  He does not take anything routinely for the discomfort. Allergies  Allergen Reactions  . No Known Allergies    Social History   Tobacco Use  . Smoking status: Current Every Day Smoker    Packs/day: 1.00    Years: 30.00    Pack years: 30.00    Types: Cigarettes  . Smokeless tobacco: Never Used  . Tobacco comment: discussed benefits of quitting  Substance Use Topics  . Alcohol use: Yes    Alcohol/week: 7.2 oz    Types: 12 Cans of beer per week    Comment: 12 beers on weekend   Past Medical History:  Diagnosis Date  . Allergy   . Arthritis   . Cigar smoker motivated to quit   . Hypertension   . OSA (obstructive sleep  apnea) 2004   CPAP recommended- to meet with home health 02/26/16  . Wears dentures    Past Surgical History:  Procedure Laterality Date  . DENTAL SURGERY    . TOTAL KNEE ARTHROPLASTY Right 03/07/2016   Procedure: TOTAL KNEE ARTHROPLASTY;  Surgeon: Earlie Server, MD;  Location: [facility];  Service: Orthopedics;  Laterality: Right;   Family History  Problem Relation Age of Onset  . Hypertension Mother   . COPD Mother   . Arthritis Mother   . Heart disease Mother        atrial fibrillation  . Emphysema Father   . Early death Father   . Arthritis Maternal Aunt   . Diabetes Maternal Aunt   . COPD Maternal Uncle   . Heart failure Maternal Grandfather    Allergies as of 09/01/2017      Reactions   No Known Allergies       Medication List        Accurate as of 09/01/17 11:59 PM. Always use your most recent med list.          albuterol 108 (90 Base) MCG/ACT inhaler Commonly known as:  PROVENTIL HFA;VENTOLIN HFA Inhale 2 puffs into the lungs every 6 (six) hours as needed for wheezing or shortness of breath.   budesonide-formoterol 160-4.5 MCG/ACT inhaler Commonly known as:  SYMBICORT Inhale 2 puffs into the lungs 2 (two) times  daily.   diclofenac 75 MG EC tablet Commonly known as:  VOLTAREN Take 1 tablet (75 mg total) by mouth 2 (two) times daily.   lisinopril 10 MG tablet Commonly known as:  PRINIVIL,ZESTRIL Take 1 tablet (10 mg total) by mouth daily. Please make overdue yearly appt with Dr. Saunders Revel before anymore refills. Final Attempt      ROS: Negative, with the exception of above mentioned in HPI  Objective:  BP (!) 142/88 (BP Location: Left Arm, Patient Position: Sitting, Cuff Size: Large)   Pulse 79   Temp 98 F (36.7 C) (Oral)   Ht 6' (1.829 m)   Wt 217 lb 12.8 oz (98.8 kg)   SpO2 93%   BMI 29.54 kg/m  Body mass index is 29.54 kg/m. Gen: Afebrile. No acute distress.  Nontoxic in appearance, well-developed, well-nourished, Caucasian overweight male. HENT: AT.  . MMM.  Eyes:Pupils Equal Round Reactive to light, Extraocular movements intact,  Conjunctiva without redness, discharge or icterus. CV: RRR no murmur, no edema, +2/4 P posterior tibialis pulses Chest: CTAB, no wheeze or crackles Abd: Soft.  NTND. BS present MSK: No erythema, no soft tissue swelling.  No effusion.  No tenderness to palpation.  Neurovascularly intact distally. Skin: No rashes, purpura or petechiae.  Neuro:  Normal gait. PERLA. EOMi. Alert. Oriented x3  Psych: Normal affect, dress and demeanor. Normal speech. Normal thought content and judgment.  Assessment/Plan: Todd Mercado is a 57 y.o. male present for acute OV for  Essential hypertension, benign/overweight -Mildly elevated today, patient reports he was rushing around before appointment and feels his blood pressure is elevated secondary to that. - CBC w/Diff - Comp Met (CMET) - TSH -Patient to follow-up in 1 week with a nurse visit.  -  If blood pressure is elevated again on nurse visit would increase dose to lisinopril 20 mg daily.   - If Blood pressure is normal on repeat, then will refill for a total of 6 months lisinopril 10 mg daily. - F/U 6 mos w/ provider  OSA (obstructive sleep apnea)/Pulmonary emphysema, unspecified emphysema type (HCC) Refills on Symbicort and albuterol inhaler today.   Start a daily antihistamine. Patient strongly encouraged to stop smoking.  Primary osteoarthritis of right knee -Diclofenac 75 mg twice daily for arthritis of right knee. -Follow-up every 6 months as needed  electronically signed by:  Howard Pouch, DO  [place]

## 2017-09-02 LAB — COMPREHENSIVE METABOLIC PANEL
AG RATIO: 1.4 (calc) (ref 1.0–2.5)
ALT: 17 U/L (ref 9–46)
AST: 18 U/L (ref 10–35)
Albumin: 4.2 g/dL (ref 3.6–5.1)
Alkaline phosphatase (APISO): 57 U/L (ref 40–115)
BILIRUBIN TOTAL: 0.5 mg/dL (ref 0.2–1.2)
BUN: 18 mg/dL (ref 7–25)
CALCIUM: 9.3 mg/dL (ref 8.6–10.3)
CHLORIDE: 101 mmol/L (ref 98–110)
CO2: 30 mmol/L (ref 20–32)
Creat: 0.99 mg/dL (ref 0.70–1.33)
GLOBULIN: 3.1 g/dL (ref 1.9–3.7)
Glucose, Bld: 87 mg/dL (ref 65–99)
Potassium: 4.4 mmol/L (ref 3.5–5.3)
SODIUM: 139 mmol/L (ref 135–146)
TOTAL PROTEIN: 7.3 g/dL (ref 6.1–8.1)

## 2017-09-02 LAB — CBC WITH DIFFERENTIAL/PLATELET
BASOS ABS: 90 {cells}/uL (ref 0–200)
BASOS PCT: 1.1 %
EOS PCT: 1.5 %
Eosinophils Absolute: 123 cells/uL (ref 15–500)
HCT: 43.9 % (ref 38.5–50.0)
HEMOGLOBIN: 15.5 g/dL (ref 13.2–17.1)
LYMPHS ABS: 1976 {cells}/uL (ref 850–3900)
MCH: 30.2 pg (ref 27.0–33.0)
MCHC: 35.3 g/dL (ref 32.0–36.0)
MCV: 85.4 fL (ref 80.0–100.0)
MPV: 10.3 fL (ref 7.5–12.5)
Monocytes Relative: 7.1 %
NEUTROS ABS: 5428 {cells}/uL (ref 1500–7800)
Neutrophils Relative %: 66.2 %
Platelets: 177 10*3/uL (ref 140–400)
RBC: 5.14 10*6/uL (ref 4.20–5.80)
RDW: 13 % (ref 11.0–15.0)
Total Lymphocyte: 24.1 %
WBC mixed population: 582 cells/uL (ref 200–950)
WBC: 8.2 10*3/uL (ref 3.8–10.8)

## 2017-09-02 LAB — TSH: TSH: 1.06 mIU/L (ref 0.40–4.50)

## 2017-09-02 MED ORDER — DICLOFENAC SODIUM 75 MG PO TBEC: 75.0000 mg | DELAYED_RELEASE_TABLET | Freq: Two times a day (BID) | ORAL | 5 refills | Status: DC

## 2017-09-02 MED ORDER — BUDESONIDE-FORMOTEROL FUMARATE 160-4.5 MCG/ACT IN AERO: 2.0000 | INHALATION_SPRAY | Freq: Two times a day (BID) | RESPIRATORY_TRACT | 11 refills | Status: DC

## 2017-09-02 NOTE — Telephone Encounter (Signed)
Please inform patient the following information: His labs are all normal. I have called in the medicine for his knee that he will take every 12 hours with food daily. I have also called in the Symbicort (again).  No Samples were given in the office that was a secondary to an epic error.

## 2017-09-02 NOTE — Telephone Encounter (Signed)
Spoke with patient reviewed lab results and instructions. Patient verbalized understanding. 

## 2017-09-02 NOTE — Telephone Encounter (Signed)
Patient walked in to pick up samples for Symbicort.  I consulted with Rosalita Chessman and she advised me to notify patient that we do not have any samples for him to pick up as this was noted in his chart in error.  Patient was advised he could pick up rx at pharmacy instead.

## 2017-09-04 ENCOUNTER — Encounter: Payer: Self-pay | Admitting: Family Medicine

## 2017-09-04 MED ORDER — LISINOPRIL 10 MG PO TABS: 10.0000 mg | ORAL_TABLET | Freq: Every day | ORAL | 0 refills | Status: DC

## 2017-09-08 ENCOUNTER — Ambulatory Visit: Payer: Self-pay

## 2017-09-11 ENCOUNTER — Ambulatory Visit: Payer: Self-pay

## 2017-09-14 ENCOUNTER — Ambulatory Visit (INDEPENDENT_AMBULATORY_CARE_PROVIDER_SITE_OTHER): Payer: Self-pay

## 2017-09-14 VITALS — BP 136/82 | HR 74

## 2017-09-14 DIAGNOSIS — I10 Essential (primary) hypertension: Secondary | ICD-10-CM

## 2017-09-14 MED ORDER — LISINOPRIL 10 MG PO TABS: 15.0000 mg | ORAL_TABLET | Freq: Every day | ORAL | 1 refills | Status: DC

## 2017-09-14 NOTE — Progress Notes (Addendum)
Todd Mercado is a 57 y.o. male presents to the office today for Blood pressure recheck secondary to elevated BP in office. Blood pressure medication: Lisinopril  one tab daily. If on medication, Last dose was at least 1-2 hours prior to recheck: YES Blood pressure was taken in the left arm after patient rested for 10 minutes.  BP: 136/82  Todd Mercado   BP 136/82 (BP Location: Left Arm, Patient Position: Sitting, Cuff Size: Large)   Pulse 74   Repeat BP in the office today is borderline, but improved from OV.  Increase dose to lisinopril 15 mg QD (1.5 pills per day of 10 mg tab). This is a minimal change, just give a little extra coverage.  F/U 6 mos.   Medical screening examination/treatment/procedure(s) were performed by non-physician practitioner and as supervising physician I was immediately available for consultation/collaboration.  I agree with above assessment and plan.  Electronically Signed by: Felix Pacini, DO [facility]- OR

## 2017-09-14 NOTE — Addendum Note (Signed)
Addended by: Felix Pacini A on: 09/14/2017 04:27 PM   Modules accepted: Orders, Level of Service

## 2017-09-14 NOTE — Progress Notes (Signed)
Patient notified and verbalized understanding. 

## 2017-12-22 ENCOUNTER — Ambulatory Visit: Payer: Self-pay | Admitting: Family Medicine

## 2017-12-22 DIAGNOSIS — Z0289 Encounter for other administrative examinations: Secondary | ICD-10-CM

## 2018-02-22 ENCOUNTER — Other Ambulatory Visit: Payer: Self-pay | Admitting: *Deleted

## 2018-02-22 MED ORDER — DICLOFENAC SODIUM 75 MG PO TBEC: 75.0000 mg | DELAYED_RELEASE_TABLET | Freq: Two times a day (BID) | ORAL | 0 refills | Status: DC

## 2018-03-22 ENCOUNTER — Ambulatory Visit: Payer: Self-pay | Admitting: Family Medicine

## 2018-03-22 ENCOUNTER — Encounter: Payer: Self-pay | Admitting: Family Medicine

## 2018-03-22 VITALS — BP 168/96 | HR 57 | Temp 97.8°F | Resp 20 | Ht 72.0 in | Wt 229.6 lb

## 2018-03-22 DIAGNOSIS — R140 Abdominal distension (gaseous): Secondary | ICD-10-CM

## 2018-03-22 DIAGNOSIS — G4733 Obstructive sleep apnea (adult) (pediatric): Secondary | ICD-10-CM

## 2018-03-22 DIAGNOSIS — Z23 Encounter for immunization: Secondary | ICD-10-CM

## 2018-03-22 DIAGNOSIS — R1031 Right lower quadrant pain: Secondary | ICD-10-CM | POA: Diagnosis not present

## 2018-03-22 DIAGNOSIS — I10 Essential (primary) hypertension: Secondary | ICD-10-CM

## 2018-03-22 DIAGNOSIS — R6881 Early satiety: Secondary | ICD-10-CM

## 2018-03-22 DIAGNOSIS — E663 Overweight: Secondary | ICD-10-CM

## 2018-03-22 DIAGNOSIS — R1013 Epigastric pain: Secondary | ICD-10-CM | POA: Diagnosis not present

## 2018-03-22 MED ORDER — LISINOPRIL 20 MG PO TABS: 20.0000 mg | ORAL_TABLET | Freq: Every day | ORAL | 1 refills | Status: DC

## 2018-03-22 MED ORDER — DICLOFENAC SODIUM 75 MG PO TBEC: 75.0000 mg | DELAYED_RELEASE_TABLET | Freq: Two times a day (BID) | ORAL | 1 refills | Status: DC

## 2018-03-22 MED ORDER — OMEPRAZOLE 40 MG PO CPDR: 40.0000 mg | DELAYED_RELEASE_CAPSULE | Freq: Every day | ORAL | 3 refills | Status: DC

## 2018-03-22 NOTE — Progress Notes (Signed)
Patient ID: Todd Mercado, male   DOB: 10-23-1960, 57 y.o.   MRN: [id number]    Todd Mercado , 10-23-1960, 57 y.o., male MRN: [id number]   Patient Care Team    Relationship Specialty Notifications Start End  Natalia LeatherwoodKuneff, Shanaiya Bene A, DO PCP - General Family Medicine  07/02/15   Frederico Hammanaffrey, Daniel, MD Consulting Physician Orthopedic Surgery  09/04/17      Chief Complaint  Patient presents with  . Hypertension  . Bloated    epigastric pain x 1 week    Subjective:  Hypertension: Pt reports compliance with lisinopril 15 mg daily. Blood pressures ranges at home are routinely above goal per pt.  Patient denies chest pain, shortness of breath, dizziness or lower extremity edema.. Pt does not take a daily baby ASA. Pt is not prescribed statin. BMP: 09/01/2017 creatinine 0.99 CBC: 09/01/2017 within normal limits TSH: 09/01/2017 1.06 Diet: No routine diet Exercise: No routine exercise RF: Hypertension, overweight, smoker, family history present  OSA/pulmonary emphysema/shortness of breath: Patient reports his breathing is doing well.   Right knee pain/osteoarthritis: Patient has had a right total knee arthroplasty November 2017.  He reports the diclofenac is very helpful with his knee pain.   Allergies  Allergen Reactions  . No Known Allergies    Social History   Tobacco Use  . Smoking status: Current Every Day Smoker    Packs/day: 1.00    Years: 30.00    Pack years: 30.00    Types: Cigarettes  . Smokeless tobacco: Never Used  . Tobacco comment: discussed benefits of quitting  Substance Use Topics  . Alcohol use: Yes    Alcohol/week: 12.0 standard drinks    Types: 12 Cans of beer per week    Comment: 12 beers on weekend   Past Medical History:  Diagnosis Date  . Allergy   . Arthritis   . Cigar smoker motivated to quit   . Hypertension   . OSA (obstructive sleep apnea) 2004   CPAP recommended- to meet with home health 02/26/16  . Wears dentures    Past Surgical History:  Procedure  Laterality Date  . DENTAL SURGERY    . TOTAL KNEE ARTHROPLASTY Right 03/07/2016   Procedure: TOTAL KNEE ARTHROPLASTY;  Surgeon: Frederico Hammananiel Caffrey, MD;  [facility];  Service: Orthopedics;  Laterality: Right;   Family History  Problem Relation Age of Onset  . Hypertension Mother   . COPD Mother   . Arthritis Mother   . Heart disease Mother        atrial fibrillation  . Emphysema Father   . Early death Father   . Arthritis Maternal Aunt   . Diabetes Maternal Aunt   . COPD Maternal Uncle   . Heart failure Maternal Grandfather    Allergies as of 03/22/2018      Reactions   No Known Allergies       Medication List        Accurate as of 03/22/18  3:08 PM. Always use your most recent med list.          albuterol 108 (90 Base) MCG/ACT inhaler Commonly known as:  PROVENTIL HFA;VENTOLIN HFA Inhale 2 puffs into the lungs every 6 (six) hours as needed for wheezing or shortness of breath.   budesonide-formoterol 160-4.5 MCG/ACT inhaler Commonly known as:  SYMBICORT Inhale 2 puffs into the lungs 2 (two) times daily.   diclofenac 75 MG EC tablet Commonly known as:  VOLTAREN Take 1 tablet (75 mg total) by mouth 2 (two)  times daily. Needs office visit prior to any additional refills   lisinopril 20 MG tablet Commonly known as:  PRINIVIL,ZESTRIL Take 1 tablet (20 mg total) by mouth daily.   omeprazole 40 MG capsule Commonly known as:  PRILOSEC Take 1 capsule (40 mg total) by mouth daily.      ROS: Negative, with the exception of above mentioned in HPI  Objective:  BP (!) 168/96 (BP Location: Right Arm, Patient Position: Sitting, Cuff Size: Large)   Pulse (!) 57   Temp 97.8 F (36.6 C)   Resp 20   Ht 6' (1.829 m)   Wt 229 lb 9.6 oz (104.1 kg)   SpO2 96%   BMI 31.14 kg/m  Body mass index is 31.14 kg/m. Gen: Afebrile. No acute distress. Nontoxic in presentation HENT: AT. . MMM.  Eyes:Pupils Equal Round Reactive to light, Extraocular movements intact,  Conjunctiva  without redness, discharge or icterus. Neck/lymp/endocrine: Supple,no lymphadenopathy, no thyromegaly CV: RRR no murmur, no edema, +2/4 P posterior tibialis pulses Chest: CTAB, no wheeze or crackles Abd: Soft.obese. Distended- TTP epigastric and RLQ. BS present. no Masses palpated.  Skin: no rashes, purpura or petechiae.  Neuro:  Normal gait. PERLA. EOMi. Alert. Oriented   Psych: Normal affect, dress and demeanor. Normal speech. Normal thought content and judgment.   Assessment/Plan: Todd Mercado is a 57 y.o. male present for acute OV for  Essential hypertension, benign/overweight - elevated today. Increase lisinopril to 20 mg Qd.  - Labs UTD - F/U 6 mos w/ provider  OSA (obstructive sleep apnea)/Pulmonary emphysema, unspecified emphysema type (HCC) Continue Symbicort and albuterol inhaler today.   Start a daily antihistamine. Patient strongly encouraged to stop smoking.  Primary osteoarthritis of right knee - Continue Diclofenac 75 mg twice daily for arthritis of right knee. -Follow-up every 6 months as needed  Abd pain:  - He is rather distended and uncomfortable with exam epigastric and RLQ discomfort. He has never had a colonoscopy. He denies bowel changes. He endorses early satiety.  - CMP, lipase, H. Pylori, CBC.  - everyday smoker.  - continue pepcid, start omeprazole daily. Follow GERD diet. - may need to hold diclofenac for short term.  - further eval dependent upon labs.   Influenza vaccine provided today.  electronically signed by:  Felix Pacini, DO  [facility] - OR

## 2018-03-22 NOTE — Patient Instructions (Addendum)
Increase Lisinopril to 20 mg a day. You can take 2 of the 10 mg you have at home. New pill will be 20 mg (take one then).   Continue the pepcid and start the omeprazole prescribed today, one a day. Follow a GERD diet (below).   We will discuss follow up when I call you back with labs.     Gastritis, Adult Gastritis is swelling (inflammation) of the stomach. When you have this condition, you can have these problems (symptoms):  Pain in your stomach.  A burning feeling in your stomach.  Feeling sick to your stomach (nauseous).  Throwing up (vomiting).  Feeling too full after you eat.  It is important to get help for this condition. Without help, your stomach can bleed, and you can get sores (ulcers) in your stomach. Follow these instructions at home:  Take over-the-counter and prescription medicines only as told by your doctor.  If you were prescribed an antibiotic medicine, take it as told by your doctor. Do not stop taking it even if you start to feel better.  Drink enough fluid to keep your pee (urine) clear or pale yellow.  Instead of eating big meals, eat small meals often. Contact a health care provider if:  Your problems get worse.  Your problems go away and then come back. Get help right away if:  You throw up blood or something that looks like coffee grounds.  You have black or dark red poop (stools).  You cannot keep fluids down.  Your stomach pain gets worse.  You have a fever.  You do not feel better after 1 week. This information is not intended to replace advice given to you by your health care provider. Make sure you discuss any questions you have with your health care provider. Document Released: 10/08/2007 Document Revised: 12/19/2015 Document Reviewed: 01/13/2015 Elsevier Interactive Patient Education  2018 [employer]  Gastroesophageal Reflux Disease, Adult Normally, food travels down the esophagus and stays in the stomach to be digested. If a  person has gastroesophageal reflux disease (GERD), food and stomach acid move back up into the esophagus. When this happens, the esophagus becomes sore and swollen (inflamed). Over time, GERD can make small holes (ulcers) in the lining of the esophagus. Follow these instructions at home: Diet  Follow a diet as told by your doctor. You may need to avoid foods and drinks such as: ? Coffee and tea (with or without caffeine). ? Drinks that contain alcohol. ? Energy drinks and sports drinks. ? Carbonated drinks or sodas. ? Chocolate and cocoa. ? Peppermint and mint flavorings. ? Garlic and onions. ? Horseradish. ? Spicy and acidic foods, such as peppers, chili powder, curry powder, vinegar, hot sauces, and BBQ sauce. ? Citrus fruit juices and citrus fruits, such as oranges, lemons, and limes. ? Tomato-based foods, such as red sauce, chili, salsa, and pizza with red sauce. ? Fried and fatty foods, such as donuts, french fries, potato chips, and high-fat dressings. ? High-fat meats, such as hot dogs, rib eye steak, sausage, ham, and bacon. ? High-fat dairy items, such as whole milk, butter, and cream cheese.  Eat small meals often. Avoid eating large meals.  Avoid drinking large amounts of liquid with your meals.  Avoid eating meals during the 2-3 hours before bedtime.  Avoid lying down right after you eat.  Do not exercise right after you eat. General instructions  Pay attention to any changes in your symptoms.  Take over-the-counter and prescription medicines only  as told by your doctor. Do not take aspirin, ibuprofen, or other NSAIDs unless your doctor says it is okay.  Do not use any tobacco products, including cigarettes, chewing tobacco, and e-cigarettes. If you need help quitting, ask your doctor.  Wear loose clothes. Do not wear anything tight around your waist.  Raise (elevate) the head of your bed about 6 inches (15 cm).  Try to lower your stress. If you need help doing  this, ask your doctor.  If you are overweight, lose an amount of weight that is healthy for you. Ask your doctor about a safe weight loss goal.  Keep all follow-up visits as told by your doctor. This is important. Contact a doctor if:  You have new symptoms.  You lose weight and you do not know why it is happening.  You have trouble swallowing, or it hurts to swallow.  You have wheezing or a cough that keeps happening.  Your symptoms do not get better with treatment.  You have a hoarse voice. Get help right away if:  You have pain in your arms, neck, jaw, teeth, or back.  You feel sweaty, dizzy, or light-headed.  You have chest pain or shortness of breath.  You throw up (vomit) and your throw up looks like blood or coffee grounds.  You pass out (faint).  Your poop (stool) is bloody or black.  You cannot swallow, drink, or eat. This information is not intended to replace advice given to you by your health care provider. Make sure you discuss any questions you have with your health care provider. Document Released: 10/08/2007 Document Revised: 09/27/2015 Document Reviewed: 08/16/2014 Elsevier Interactive Patient Education  [employer].

## 2018-03-23 ENCOUNTER — Telehealth: Payer: Self-pay | Admitting: Family Medicine

## 2018-03-23 ENCOUNTER — Other Ambulatory Visit (INDEPENDENT_AMBULATORY_CARE_PROVIDER_SITE_OTHER): Payer: BLUE CROSS/BLUE SHIELD

## 2018-03-23 ENCOUNTER — Other Ambulatory Visit: Payer: BLUE CROSS/BLUE SHIELD

## 2018-03-23 DIAGNOSIS — R1013 Epigastric pain: Secondary | ICD-10-CM

## 2018-03-23 DIAGNOSIS — R945 Abnormal results of liver function studies: Secondary | ICD-10-CM

## 2018-03-23 DIAGNOSIS — R17 Unspecified jaundice: Secondary | ICD-10-CM

## 2018-03-23 DIAGNOSIS — R1031 Right lower quadrant pain: Secondary | ICD-10-CM

## 2018-03-23 DIAGNOSIS — R6881 Early satiety: Secondary | ICD-10-CM

## 2018-03-23 DIAGNOSIS — R7989 Other specified abnormal findings of blood chemistry: Secondary | ICD-10-CM

## 2018-03-23 DIAGNOSIS — R140 Abdominal distension (gaseous): Secondary | ICD-10-CM

## 2018-03-23 DIAGNOSIS — R110 Nausea: Secondary | ICD-10-CM

## 2018-03-23 DIAGNOSIS — R112 Nausea with vomiting, unspecified: Secondary | ICD-10-CM

## 2018-03-23 LAB — COMPREHENSIVE METABOLIC PANEL
AG Ratio: 1.5 (calc) (ref 1.0–2.5)
ALBUMIN MSPROF: 4 g/dL (ref 3.6–5.1)
ALKALINE PHOSPHATASE (APISO): 66 U/L (ref 40–115)
ALT: 104 U/L — ABNORMAL HIGH (ref 9–46)
AST: 121 U/L — ABNORMAL HIGH (ref 10–35)
BUN: 10 mg/dL (ref 7–25)
CO2: 34 mmol/L — ABNORMAL HIGH (ref 20–32)
Calcium: 9.9 mg/dL (ref 8.6–10.3)
Chloride: 98 mmol/L (ref 98–110)
Creat: 0.9 mg/dL (ref 0.70–1.33)
Globulin: 2.7 g/dL (calc) (ref 1.9–3.7)
Glucose, Bld: 102 mg/dL — ABNORMAL HIGH (ref 65–99)
POTASSIUM: 4.7 mmol/L (ref 3.5–5.3)
Sodium: 136 mmol/L (ref 135–146)
Total Bilirubin: 1.6 mg/dL — ABNORMAL HIGH (ref 0.2–1.2)
Total Protein: 6.7 g/dL (ref 6.1–8.1)

## 2018-03-23 LAB — CBC WITH DIFFERENTIAL/PLATELET
Basophils Absolute: 70 cells/uL (ref 0–200)
Basophils Relative: 1.2 %
Eosinophils Absolute: 110 cells/uL (ref 15–500)
Eosinophils Relative: 1.9 %
HEMATOCRIT: 40.5 % (ref 38.5–50.0)
HEMOGLOBIN: 14.4 g/dL (ref 13.2–17.1)
LYMPHS ABS: 1056 {cells}/uL (ref 850–3900)
MCH: 30.9 pg (ref 27.0–33.0)
MCHC: 35.6 g/dL (ref 32.0–36.0)
MCV: 86.9 fL (ref 80.0–100.0)
MPV: 9.8 fL (ref 7.5–12.5)
Monocytes Relative: 6.7 %
NEUTROS ABS: 4176 {cells}/uL (ref 1500–7800)
NEUTROS PCT: 72 %
Platelets: 159 10*3/uL (ref 140–400)
RBC: 4.66 10*6/uL (ref 4.20–5.80)
RDW: 12.8 % (ref 11.0–15.0)
Total Lymphocyte: 18.2 %
WBC mixed population: 389 cells/uL (ref 200–950)
WBC: 5.8 10*3/uL (ref 3.8–10.8)

## 2018-03-23 LAB — H. PYLORI ANTIBODY, IGG: H Pylori IgG: NEGATIVE

## 2018-03-23 LAB — LIPASE: Lipase: 26 U/L (ref 7–60)

## 2018-03-23 MED ORDER — ONDANSETRON HCL 4 MG PO TABS: 4.0000 mg | ORAL_TABLET | Freq: Three times a day (TID) | ORAL | 1 refills | Status: DC | PRN

## 2018-03-23 NOTE — Telephone Encounter (Signed)
Message left on mothers voice mail to return call to update cell pone number for [name].

## 2018-03-23 NOTE — Telephone Encounter (Signed)
Message left on voice mail for patient to return call. 

## 2018-03-23 NOTE — Telephone Encounter (Signed)
Please inform patient the following information: His labs are concerning with very high liver enzymes and elevated bilirubin. We will need to start additional work up quickly to find cause.  - I have placed an order for CT abd/pelvis- they will be calling him possibly today to schedule. Need ASAP.  - I have placed additional labs to be collected here via lab appt only - should be collected today if able.  - I have called in an additional med for nausea, in the event he needs it.  - He is to avoid any alcohol or tylenol use until we figure out the cause.  - If his pain worsens, he develops fever, unable to tolerate PO or develops bowel changes/constipation he is to report to ED immediately. Otherwise we will start work up outpatient and refer him to gastroenterology if needed .

## 2018-03-23 NOTE — Telephone Encounter (Signed)
Patient presents for lab draw. Reiterated information and CT instructions. Patient verbalized understanding.

## 2018-03-23 NOTE — Telephone Encounter (Signed)
Pt given lab results per notes of Dr. Claiborne BillingsKuneff on 03/23/18. Pt verbalized understanding. I advised his CT appointment is scheduled and to check his messages for the date of 03/25/18. Lab appointment scheduled for today at 1600. Initially patient was told 1615 as it was entered in error at another practice. Patient called, left VM of the appointment at 1600.

## 2018-03-25 ENCOUNTER — Ambulatory Visit (INDEPENDENT_AMBULATORY_CARE_PROVIDER_SITE_OTHER)
Admission: RE | Admit: 2018-03-25 | Discharge: 2018-03-25 | Disposition: A | Discharge status: Home / Self Care | Payer: BLUE CROSS/BLUE SHIELD | Source: Ambulatory Visit | Attending: Family Medicine | Admitting: Family Medicine

## 2018-03-25 DIAGNOSIS — R1013 Epigastric pain: Secondary | ICD-10-CM | POA: Diagnosis not present

## 2018-03-25 DIAGNOSIS — R945 Abnormal results of liver function studies: Secondary | ICD-10-CM

## 2018-03-25 DIAGNOSIS — R6881 Early satiety: Secondary | ICD-10-CM

## 2018-03-25 DIAGNOSIS — R1031 Right lower quadrant pain: Secondary | ICD-10-CM | POA: Diagnosis not present

## 2018-03-25 DIAGNOSIS — R17 Unspecified jaundice: Secondary | ICD-10-CM

## 2018-03-25 DIAGNOSIS — R7989 Other specified abnormal findings of blood chemistry: Secondary | ICD-10-CM

## 2018-03-25 DIAGNOSIS — R140 Abdominal distension (gaseous): Secondary | ICD-10-CM

## 2018-03-25 DIAGNOSIS — R112 Nausea with vomiting, unspecified: Secondary | ICD-10-CM

## 2018-03-25 DIAGNOSIS — R110 Nausea: Secondary | ICD-10-CM

## 2018-03-25 MED ORDER — IOPAMIDOL (ISOVUE-300) INJECTION 61%
100.0000 mL | Freq: Once | INTRAVENOUS | Status: AC | PRN
  Administered 2018-03-25: 100 mL via INTRAVENOUS

## 2018-03-26 ENCOUNTER — Telehealth: Payer: Self-pay | Admitting: Family Medicine

## 2018-03-26 DIAGNOSIS — F1011 Alcohol abuse, in remission: Secondary | ICD-10-CM

## 2018-03-26 DIAGNOSIS — R7989 Other specified abnormal findings of blood chemistry: Secondary | ICD-10-CM

## 2018-03-26 DIAGNOSIS — R1013 Epigastric pain: Secondary | ICD-10-CM

## 2018-03-26 DIAGNOSIS — R945 Abnormal results of liver function studies: Secondary | ICD-10-CM

## 2018-03-26 DIAGNOSIS — R1031 Right lower quadrant pain: Secondary | ICD-10-CM

## 2018-03-26 DIAGNOSIS — R140 Abdominal distension (gaseous): Secondary | ICD-10-CM

## 2018-03-26 DIAGNOSIS — R17 Unspecified jaundice: Secondary | ICD-10-CM

## 2018-03-26 DIAGNOSIS — R6881 Early satiety: Secondary | ICD-10-CM

## 2018-03-26 LAB — SOLUBLE LIVER AG (IGG AB): ANTI-SLA, IGG: 0.9 U (ref 0.0–20.0)

## 2018-03-26 NOTE — Telephone Encounter (Signed)
Please inform patient the following information: His CT did not show evidence of cause of symtpoms and abnormal labs. His liver enzymes, bilirubin are greatly increased and his inflammatory marker is also increased. The rest of his labs are returning normal without identifiable cause.  - He did have a good deal of stool in his colon and could benefit from a daily stool softener (such as dulcolax or miralax).  - Continue the current plan. Avoid alcohol, tylenol and stop the voltaren (for now). I have referred him urgently to Gastroenterology for further evaluation. They will be calling him to schedule. In the meantime, if his pain, nausea or condition worsens he should be seen in the ED.

## 2018-03-26 NOTE — Telephone Encounter (Signed)
Patient notified and verbalized understanding. 

## 2018-03-29 LAB — ANTI-SMOOTH MUSCLE ANTIBODY, IGG: Actin (Smooth Muscle) Antibody (IGG): <20 U (ref <20)

## 2018-03-29 LAB — SEDIMENTATION RATE: SED RATE: 9 mm/h (ref 0–20)

## 2018-03-29 LAB — ANA, IFA COMPREHENSIVE PANEL
Anti Nuclear Antibody(ANA): NEGATIVE
ENA SM AB SER-ACNC: NEGATIVE AI
SM/RNP: NEGATIVE AI
SSA (RO) (ENA) ANTIBODY, IGG: NEGATIVE AI
SSB (La) (ENA) Antibody, IgG: <1 AI
Scleroderma (Scl-70) (ENA) Antibody, IgG: <1 AI

## 2018-03-29 LAB — IRON,TIBC AND FERRITIN PANEL
%SAT: 24 % (ref 20–48)
FERRITIN: 819 ng/mL — AB (ref 38–380)
Iron: 72 ug/dL (ref 50–180)
TIBC: 300 mcg/dL (calc) (ref 250–425)

## 2018-03-29 LAB — HEPATITIS PANEL, ACUTE
HEP B C IGM: NONREACTIVE
HEP B S AG: NONREACTIVE
HEP C AB: NONREACTIVE
Hep A IgM: NONREACTIVE
SIGNAL TO CUT-OFF: 0.02 (ref <1.00)

## 2018-03-29 LAB — BILIRUBIN, FRACTIONATED(TOT/DIR/INDIR)
BILIRUBIN DIRECT: 1 mg/dL — AB (ref 0.0–0.2)
Indirect Bilirubin: 0.7 mg/dL (calc) (ref 0.2–1.2)
Total Bilirubin: 1.7 mg/dL — ABNORMAL HIGH (ref 0.2–1.2)

## 2018-03-29 LAB — PROTIME-INR
INR: 1
Prothrombin Time: 10.1 s (ref 9.0–11.5)

## 2018-03-29 LAB — ALPHA-1-ANTITRYPSIN: A-1 Antitrypsin, Ser: 112 mg/dL (ref 83–199)

## 2018-03-29 LAB — C-REACTIVE PROTEIN: CRP: 25.7 mg/L — ABNORMAL HIGH (ref <8.0)

## 2018-03-30 ENCOUNTER — Other Ambulatory Visit (INDEPENDENT_AMBULATORY_CARE_PROVIDER_SITE_OTHER): Payer: BLUE CROSS/BLUE SHIELD

## 2018-03-30 ENCOUNTER — Encounter: Payer: Self-pay | Admitting: Internal Medicine

## 2018-03-30 ENCOUNTER — Ambulatory Visit (INDEPENDENT_AMBULATORY_CARE_PROVIDER_SITE_OTHER): Payer: BLUE CROSS/BLUE SHIELD | Admitting: Internal Medicine

## 2018-03-30 VITALS — BP 142/80 | HR 80 | Ht 72.0 in | Wt 226.0 lb

## 2018-03-30 DIAGNOSIS — K219 Gastro-esophageal reflux disease without esophagitis: Secondary | ICD-10-CM

## 2018-03-30 DIAGNOSIS — R1013 Epigastric pain: Secondary | ICD-10-CM

## 2018-03-30 DIAGNOSIS — Z1211 Encounter for screening for malignant neoplasm of colon: Secondary | ICD-10-CM

## 2018-03-30 DIAGNOSIS — R945 Abnormal results of liver function studies: Secondary | ICD-10-CM

## 2018-03-30 DIAGNOSIS — R7989 Other specified abnormal findings of blood chemistry: Secondary | ICD-10-CM

## 2018-03-30 LAB — HEPATIC FUNCTION PANEL
ALBUMIN: 4.3 g/dL (ref 3.5–5.2)
ALT: 48 U/L (ref 0–53)
AST: 21 U/L (ref 0–37)
Alkaline Phosphatase: 77 U/L (ref 39–117)
Bilirubin, Direct: 0.1 mg/dL (ref 0.0–0.3)
Total Bilirubin: 0.5 mg/dL (ref 0.2–1.2)
Total Protein: 7.5 g/dL (ref 6.0–8.3)

## 2018-03-30 MED ORDER — NA SULFATE-K SULFATE-MG SULF 17.5-3.13-1.6 GM/177ML PO SOLN: 1.0000 | Freq: Once | ORAL | 0 refills | Status: AC

## 2018-03-30 NOTE — Patient Instructions (Signed)
Your provider has requested that you go to the basement level for lab work before leaving today. Press "B" on the elevator. The lab is located at the first door on the left as you exit the elevator.  You have been scheduled for an abdominal ultrasound at [facility] (1st floor of hospital) on 04/05/2018 at 9:30am. Please arrive 15 minutes prior to your appointment for registration. Make certain not to have anything to eat or drink after midnight prior to your appointment. Should you need to reschedule your appointment, please contact radiology at [phone]. This test typically takes about 30 minutes to perform.  You have been scheduled for an endoscopy and colonoscopy. Please follow the written instructions given to you at your visit today. Please pick up your prep supplies at the pharmacy within the next 1-3 days. If you use inhalers (even only as needed), please bring them with you on the day of your procedure. Your physician has requested that you go to www.startemmi.com and enter the access code given to you at your visit today. This web site gives a general overview about your procedure. However, you should still follow specific instructions given to you by our office regarding your preparation for the procedure.  Continue your Omeprazole

## 2018-03-30 NOTE — Progress Notes (Signed)
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 Claiborne Billings regarding abdominal pain.  Patient has a history of chronic tobacco abuse, arthritis, hypertension, and sleep apnea.  He reports to me many year history of GERD.  He tells me that he needs to take antacids such as Tums on a daily basis.  No dysphasia.  No prior GI evaluations.  He was evaluated March 22, 2018 with 2 months of epigastric pain, gas, bloating.  Symptoms in the upper abdomen.  Occasionally postprandial vomiting.  The discomfort did not radiate.  He was taken off NSAID and placed on PPI.  His symptoms have improved over the past week.  He did have blood work performed.  His liver tests were abnormal with AST 121 and ALT 104.  Total bilirubin 1.6.  Normal alkaline phosphatase.  Normal lipase liver tests in April were normal.  Patient had been drinking considerable alcohol the weekend prior to these liver test being checked.  He has not had repeat liver test.  He was scheduled for a CT scan of the abdomen and pelvis which was performed March 25, 2018.  Examination revealed no significant abnormalities.  Increased stool in the colon somewhat.  No gallstones noted.  He continues to smoke.  Patient has not had prior GI evaluation.  No prior colon cancer screening.  No family history of colon cancer.  REVIEW OF SYSTEMS:  All non-GI ROS negative unless otherwise stated in the HPI except for wheezing, cough, arthritis, allergies  Past Medical History:  Diagnosis Date  . Allergy   . Arthritis   . Cigar smoker motivated to quit   . Hypertension   . OSA (obstructive sleep apnea) 2004   CPAP recommended- to meet with home health 02/26/16  . Wears dentures     Past Surgical History:  Procedure Laterality Date  . DENTAL SURGERY    . TOTAL KNEE ARTHROPLASTY Right 03/07/2016   Procedure: TOTAL KNEE ARTHROPLASTY;  Surgeon: Frederico Hamman, MD;   Location: [facility] OR;  Service: Orthopedics;  Laterality: Right;    Social History Wyatte Dames  reports that he has been smoking cigarettes. He has a 30.00 pack-year smoking history. He has never used smokeless tobacco. He reports that he drinks about 12.0 standard drinks of alcohol per week. He reports that he does not use drugs.  family history includes Arthritis in his maternal aunt and mother; COPD in his maternal uncle and mother; Diabetes in his maternal aunt; Early death in his father; Emphysema in his father; Heart disease in his mother; Heart failure in his maternal grandfather; Hypertension in his mother.  Allergies  Allergen Reactions  . No Known Allergies        PHYSICAL EXAMINATION: Vital signs: BP (!) 142/80   Pulse 80   Ht 6' (1.829 m)   Wt 226 lb (102.5 kg)   BMI 30.65 kg/m   Constitutional: generally well-appearing, no acute distress Psychiatric: alert and oriented x3, cooperative Eyes: extraocular movements intact, anicteric, conjunctiva pink Mouth: oral pharynx moist, no lesions Neck: supple no lymphadenopathy Cardiovascular: heart regular rate and rhythm, no murmur Lungs: Auditory wheezing but otherwise clear to auscultation bilaterally Abdomen: soft, nontender, nondistended, no obvious ascites, no peritoneal signs, normal bowel sounds, no organomegaly Rectal: Deferred until colonoscopy Extremities: no clubbing, cyanosis, or lower extremity edema bilaterally Skin: no lesions on visible extremities Neuro: No focal deficits.  Cranial nerves intact.  No asterixis.    ASSESSMENT:  1.  935-month history of epigastric pain.  Possible etiologies include uncontrolled GERD, NSAID related ulcer disease, or symptomatic gallstones (CT scan negative) 2.  Colon cancer screening.  Baseline risk 3.  Elevated liver test.  Possibly alcohol.  Rule out choledocholithiasis 4.  Chronic GERD.  Improved on PPI 5.  Chronic tobacco abuse with wheezing today  PLAN:  1.  Continue  omeprazole 40 mg daily 2.  Schedule upper endoscopy to evaluate upper abdominal pain and chronic GERD.  Rule out Barrett's.  Rule out ulcer disease.  Rule out Helicobacter pylori.The nature of the procedure, as well as the risks, benefits, and alternatives were carefully and thoroughly reviewed with the patient. Ample time for discussion and questions allowed. The patient understood, was satisfied, and agreed to proceed. 3.  Repeat liver test today 4.  Schedule abdominal ultrasound to rule out gallstones.  More sensitive than CT scan 5.  Schedule colonoscopy for colon cancer screening.  Baseline risk.The nature of the procedure, as well as the risks, benefits, and alternatives were carefully and thoroughly reviewed with the patient. Ample time for discussion and questions allowed. The patient understood, was satisfied, and agreed to proceed. 6.  Stop smoking 7.  Minimize alcohol use Copy of this dictation has been sent to Dr. Claiborne BillingsKuneff

## 2018-04-05 ENCOUNTER — Ambulatory Visit (HOSPITAL_COMMUNITY)
Admission: RE | Admit: 2018-04-05 | Discharge: 2018-04-05 | Disposition: A | Discharge status: Home / Self Care | Payer: BLUE CROSS/BLUE SHIELD | Source: Ambulatory Visit | Attending: Internal Medicine | Admitting: Internal Medicine

## 2018-04-05 ENCOUNTER — Other Ambulatory Visit: Payer: Self-pay

## 2018-04-05 DIAGNOSIS — R945 Abnormal results of liver function studies: Secondary | ICD-10-CM

## 2018-04-05 DIAGNOSIS — R1013 Epigastric pain: Secondary | ICD-10-CM | POA: Insufficient documentation

## 2018-04-05 DIAGNOSIS — K219 Gastro-esophageal reflux disease without esophagitis: Secondary | ICD-10-CM | POA: Insufficient documentation

## 2018-04-05 DIAGNOSIS — R7989 Other specified abnormal findings of blood chemistry: Secondary | ICD-10-CM

## 2018-04-05 DIAGNOSIS — Z1211 Encounter for screening for malignant neoplasm of colon: Secondary | ICD-10-CM | POA: Insufficient documentation

## 2018-04-05 DIAGNOSIS — K7689 Other specified diseases of liver: Secondary | ICD-10-CM | POA: Diagnosis not present

## 2018-04-05 DIAGNOSIS — N281 Cyst of kidney, acquired: Secondary | ICD-10-CM | POA: Insufficient documentation

## 2018-04-05 NOTE — Progress Notes (Signed)
Please see previous result note.

## 2018-04-05 NOTE — Progress Notes (Signed)
Called and spoke with patient- patient informed of scheduled pre visit on 04/20/18 @ 8:00 am with arrival at 7:45 am; patient also scheduled for colon cancer screening colonoscopy in the [facility] on 05/03/18 @ 3:30 pm; patient aware the office is checking on referral to insurance for financial coverage of procedure;

## 2018-05-03 ENCOUNTER — Encounter: Payer: BLUE CROSS/BLUE SHIELD | Admitting: Internal Medicine

## 2018-06-12 ENCOUNTER — Other Ambulatory Visit: Payer: Self-pay | Admitting: Family Medicine

## 2018-06-12 DIAGNOSIS — R1031 Right lower quadrant pain: Secondary | ICD-10-CM

## 2018-06-12 DIAGNOSIS — R1013 Epigastric pain: Secondary | ICD-10-CM

## 2018-06-12 DIAGNOSIS — R140 Abdominal distension (gaseous): Secondary | ICD-10-CM

## 2018-06-16 ENCOUNTER — Other Ambulatory Visit: Payer: Self-pay | Admitting: Family Medicine

## 2018-06-17 ENCOUNTER — Other Ambulatory Visit: Payer: Self-pay | Admitting: Family Medicine

## 2018-06-18 NOTE — Telephone Encounter (Signed)
Called and LM for pharmacy explaining that new dose had been sent over with refills and the 10mg  needed to be D/C. Call back number left to return call if needed.

## 2018-06-18 NOTE — Telephone Encounter (Signed)
Received refill for lisinopril 10 mg tab that had been dc'd. His dose was increased and a a 20 mg lisinopril was prescribed with 6 mos of meds last appt.  Please make pharmacy and pt aware of other script

## 2018-06-21 ENCOUNTER — Encounter: Payer: Self-pay | Admitting: Family Medicine

## 2018-06-21 ENCOUNTER — Telehealth: Payer: Self-pay | Admitting: *Deleted

## 2018-06-21 ENCOUNTER — Other Ambulatory Visit: Payer: Self-pay

## 2018-06-21 ENCOUNTER — Ambulatory Visit: Payer: Self-pay | Admitting: Family Medicine

## 2018-06-21 VITALS — BP 157/99 | HR 72 | Temp 97.9°F | Resp 18 | Ht 72.0 in | Wt 224.0 lb

## 2018-06-21 DIAGNOSIS — F419 Anxiety disorder, unspecified: Secondary | ICD-10-CM

## 2018-06-21 DIAGNOSIS — G4733 Obstructive sleep apnea (adult) (pediatric): Secondary | ICD-10-CM

## 2018-06-21 DIAGNOSIS — I10 Essential (primary) hypertension: Secondary | ICD-10-CM

## 2018-06-21 DIAGNOSIS — J439 Emphysema, unspecified: Secondary | ICD-10-CM

## 2018-06-21 HISTORY — DX: Anxiety disorder, unspecified: F41.9

## 2018-06-21 MED ORDER — PAROXETINE HCL 20 MG PO TABS: 20.0000 mg | ORAL_TABLET | Freq: Every day | ORAL | 0 refills | Status: DC

## 2018-06-21 MED ORDER — BUDESONIDE-FORMOTEROL FUMARATE 160-4.5 MCG/ACT IN AERO: 2.0000 | INHALATION_SPRAY | Freq: Two times a day (BID) | RESPIRATORY_TRACT | 11 refills | Status: DC

## 2018-06-21 MED ORDER — LISINOPRIL 40 MG PO TABS: 40.0000 mg | ORAL_TABLET | Freq: Every day | ORAL | 1 refills | Status: DC

## 2018-06-21 MED ORDER — ALBUTEROL SULFATE HFA 108 (90 BASE) MCG/ACT IN AERS: 2.0000 | INHALATION_SPRAY | Freq: Four times a day (QID) | RESPIRATORY_TRACT | 0 refills | Status: DC | PRN

## 2018-06-21 NOTE — Telephone Encounter (Signed)
Pt was seen in office today by Dr. Claiborne Billings. See office visit note.

## 2018-06-21 NOTE — Patient Instructions (Addendum)
It was a pleasure to see you today.  Increase your lisinopril to 40 mg a day. You can take two of the 20 mg tabs you currently have. The new bottle is 40 mg per tab.  Start paxil once a day for anxiety- this has to be taken daily. Wear your CPAP. Make sure to use your inhaler Symbicort as instructed. Follow up 3 months, sooner if BP still over 135/85 with new dose   Low-Sodium Eating Plan Sodium, which is an element that makes up salt, helps you maintain a healthy balance of fluids in your body. Too much sodium can increase your blood pressure and cause fluid and waste to be held in your body. Your health care provider or dietitian may recommend following this plan if you have high blood pressure (hypertension), kidney disease, liver disease, or heart failure. Eating less sodium can help lower your blood pressure, reduce swelling, and protect your heart, liver, and kidneys. What are tips for following this plan? General guidelines  Most people on this plan should limit their sodium intake to 1,500-2,000 mg (milligrams) of sodium each day. Reading food labels   The Nutrition Facts label lists the amount of sodium in one serving of the food. If you eat more than one serving, you must multiply the listed amount of sodium by the number of servings.  Choose foods with less than 140 mg of sodium per serving.  Avoid foods with 300 mg of sodium or more per serving. Shopping  Look for lower-sodium products, often labeled as "low-sodium" or "no salt added."  Always check the sodium content even if foods are labeled as "unsalted" or "no salt added".  Buy fresh foods. ? Avoid canned foods and premade or frozen meals. ? Avoid canned, cured, or processed meats  Buy breads that have less than 80 mg of sodium per slice. Cooking  Eat more home-cooked food and less restaurant, buffet, and fast food.  Avoid adding salt when cooking. Use salt-free seasonings or herbs instead of table salt or sea  salt. Check with your health care provider or pharmacist before using salt substitutes.  Cook with plant-based oils, such as canola, sunflower, or olive oil. Meal planning  When eating at a restaurant, ask that your food be prepared with less salt or no salt, if possible.  Avoid foods that contain MSG (monosodium glutamate). MSG is sometimes added to [place] food, bouillon, and some canned foods. What foods are recommended? The items listed may not be a complete list. Talk with your dietitian about what dietary choices are best for you. Grains Low-sodium cereals, including oats, puffed wheat and rice, and shredded wheat. Low-sodium crackers. Unsalted rice. Unsalted pasta. Low-sodium bread. Whole-grain breads and whole-grain pasta. Vegetables Fresh or frozen vegetables. "No salt added" canned vegetables. "No salt added" tomato sauce and paste. Low-sodium or reduced-sodium tomato and vegetable juice. Fruits Fresh, frozen, or canned fruit. Fruit juice. Meats and other protein foods Fresh or frozen (no salt added) meat, poultry, seafood, and fish. Low-sodium canned tuna and salmon. Unsalted nuts. Dried peas, beans, and lentils without added salt. Unsalted canned beans. Eggs. Unsalted nut butters. Dairy Milk. Soy milk. Cheese that is naturally low in sodium, such as ricotta cheese, fresh mozzarella, or Swiss cheese Low-sodium or reduced-sodium cheese. Cream cheese. Yogurt. Fats and oils Unsalted butter. Unsalted margarine with no trans fat. Vegetable oils such as canola or olive oils. Seasonings and other foods Fresh and dried herbs and spices. Salt-free seasonings. Low-sodium mustard and ketchup. Sodium-free  salad dressing. Sodium-free light mayonnaise. Fresh or refrigerated horseradish. Lemon juice. Vinegar. Homemade, reduced-sodium, or low-sodium soups. Unsalted popcorn and pretzels. Low-salt or salt-free chips. What foods are not recommended? The items listed may not be a complete list. Talk  with your dietitian about what dietary choices are best for you. Grains Instant hot cereals. Bread stuffing, pancake, and biscuit mixes. Croutons. Seasoned rice or pasta mixes. Noodle soup cups. Boxed or frozen macaroni and cheese. Regular salted crackers. Self-rising flour. Vegetables Sauerkraut, pickled vegetables, and relishes. Olives. [place] fries. Onion rings. Regular canned vegetables (not low-sodium or reduced-sodium). Regular canned tomato sauce and paste (not low-sodium or reduced-sodium). Regular tomato and vegetable juice (not low-sodium or reduced-sodium). Frozen vegetables in sauces. Meats and other protein foods Meat or fish that is salted, canned, smoked, spiced, or pickled. Bacon, ham, sausage, hotdogs, corned beef, chipped beef, packaged lunch meats, salt pork, jerky, pickled herring, anchovies, regular canned tuna, sardines, salted nuts. Dairy Processed cheese and cheese spreads. Cheese curds. Blue cheese. Feta cheese. String cheese. Regular cottage cheese. Buttermilk. Canned milk. Fats and oils Salted butter. Regular margarine. Ghee. Bacon fat. Seasonings and other foods Onion salt, garlic salt, seasoned salt, table salt, and sea salt. Canned and packaged gravies. Worcestershire sauce. Tartar sauce. Barbecue sauce. Teriyaki sauce. Soy sauce, including reduced-sodium. Steak sauce. Fish sauce. Oyster sauce. Cocktail sauce. Horseradish that you find on the shelf. Regular ketchup and mustard. Meat flavorings and tenderizers. Bouillon cubes. Hot sauce and Tabasco sauce. Premade or packaged marinades. Premade or packaged taco seasonings. Relishes. Regular salad dressings. Salsa. Potato and tortilla chips. Corn chips and puffs. Salted popcorn and pretzels. Canned or dried soups. Pizza. Frozen entrees and pot pies. Summary  Eating less sodium can help lower your blood pressure, reduce swelling, and protect your heart, liver, and kidneys.  Most people on this plan should limit their sodium  intake to 1,500-2,000 mg (milligrams) of sodium each day.  Canned, boxed, and frozen foods are high in sodium. Restaurant foods, fast foods, and pizza are also very high in sodium. You also get sodium by adding salt to food.  Try to cook at home, eat more fresh fruits and vegetables, and eat less fast food, canned, processed, or prepared foods. This information is not intended to replace advice given to you by your health care provider. Make sure you discuss any questions you have with your health care provider. Document Released: 10/11/2001 Document Revised: 04/14/2016 Document Reviewed: 04/14/2016 Elsevier Interactive Patient Education  2019 [employer].

## 2018-06-21 NOTE — Telephone Encounter (Signed)
[facility] Night - Client TELEPHONE ADVICE RECORD [facility] Patient Name: Todd Mercado Gender: Male DOB: 1960/12/03  Age: 58 Y 4 M 16 D Return Phone Number: [phone] (Primary), [phone] (Secondary) Address:  [place]  [address] Client [facility] Night - Client Client Site [facility] Night Physician Todd Mercado, [place] Contact Type Call Who Is Calling Patient / Member / Family / Caregiver Call Type Triage / Clinical Relationship To Patient Self Return Phone Number [phone] (Primary) Chief Complaint Blood Pressure High Reason for Call Symptomatic / Request for Health Information Initial Comment Caller states his blood pressure is running high .Last BP reading was 167/101. At what point should he go to hospital . Translation No Nurse Assessment Nurse: Todd Picket, RN, Todd Mercado Date/Time Todd Mercado Time): 06/19/2018 10:47:23 AM Confirm and document reason for call. If symptomatic, describe symptoms. ---Caller states his blood pressure has been running high, current BP 167/101, takes Lisinopril 10mg  and it was increased to 20mg  several months ago. Last night BP 180/105 and he took an extra Lisinopril 5mg . Denies other symptoms. Has the patient traveled to [place] OR had close contact with a person known to have the novel coronavirus illness in the last 14 days? ---Not Applicable Does the patient have any new or worsening symptoms? ---Yes Will a triage be completed? ---Yes Related visit to physician within the last 2 weeks? ---No Does the PT have any chronic conditions? (i.e. diabetes, asthma, this includes High risk factors for pregnancy, etc.) ---Yes List chronic conditions. ---HTN COPD Is this a behavioral health or substance abuse call? ---No Guidelines Guideline Title Affirmed Question Affirmed Notes Nurse Date/Time (Eastern Time) High Blood Pressure Systolic BP >= 160 OR Diastolic >= 100  Scott, RN, Todd Mercado 06/19/2018 10:50:41 AM Disp. Time Todd Mercado Time) Disposition Final User 06/19/2018 10:54:30 AM SEE PCP WITHIN 3 DAYS Yes Todd Picket, RN, Todd Mercado   PLEASE NOTE:  All timestamps contained within this report are represented as [place] Standard Time. CONFIDENTIALTY NOTICE: This fax transmission is intended only for the addressee.  It contains information that is legally privileged, confidential or otherwise protected from use or disclosure.  If you are not the intended recipient, you are strictly prohibited from reviewing, disclosing, copying using or disseminating any of this information or taking any action in reliance on or regarding this information.  If you have received this fax in error, please notify us immediately by telephone so that we can arrange for its return to [place]. Phone:  [phone], Toll-Free:  [phone], Fax:  [phone] Page: 2 of 2 Call Id: [id number]   Caller Disagree/Comply Comply Caller Understands Yes PreDisposition Did not know what to do Care Advice Given Per Guideline * You need to be seen within 2 or 3 days. Call your doctor (or NP/PA) during regular office hours and make an appointment. A clinic or urgent care center are good places to go for care if your doctor's office is closed or you can't get an appointment. NOTE: If office will be open tomorrow, tell caller to call then, not in 3 days. * The goal of blood pressure treatment for most people with hypertension is to keep the blood pressure under 140/90. For people that are 60 years or older, your doctor may instead want to keep the blood pressure under 150/90. LIFESTYLE MODIFICATIONS - The following things can help you reduce your blood pressure: * EAT HEALTHY: Eat a diet rich in fresh fruits and vegetables, dietary fiber, non-animal  protein (e.g., soy), and low-fat dairy products. Avoid foods with a high content of saturated fat or cholesterol. * DECREASE SODIUM INTAKE: Aim to eat less than 2.4 g (100 mmol)  of sodium each day. Unfortunately 75% of the salt in the average person's diet is in pre-processed foods. * EXERCISE, BE MORE PHYSICALLY ACTIVE: Do at least 30 minutes of aerobic exercise (e.g., brisk walking) most days of the week. Other examples of aerobic activities cycling, jogging, and swimming. CALL BACK IF: * Weakness or numbness of the face, arm or leg on one side of the body occurs * Difficulty walking, difficulty talking, or severe headache occurs * Chest pain or difficulty breathing occurs * Your blood pressure is over 180/110 * You become worse.

## 2018-06-21 NOTE — Progress Notes (Signed)
Patient ID: Todd Mercado, male   DOB: [birth year], 58 y.o.   MRN: [id number]    Todd Mercado , 11-Aug-1960, 58 y.o., male MRN: [id number]   Patient Care Team    Relationship Specialty Notifications Start End  Natalia Leatherwood, DO PCP - General Family Medicine  07/02/15   Frederico Hamman, MD Consulting Physician Orthopedic Surgery  09/04/17      Chief Complaint  Patient presents with  . Hypertension   Patient unfortunately is self-pay, labs will have to be postponed until insured again.  Subjective:  Hypertension/tobacco use: Pt reports compliance with lisinopril 20 mg daily. Blood pressures ranges at home are routinely above goal per pt. Patient denies chest pain, shortness of breath, dizziness or lower extremity edema. Pt does not take a daily baby ASA. Pt is not prescribed statin.  He continues to smoke daily.  He has not been watching his diet.  He states he has been eating all the wrong things, he still smoking and he is not exercising.  He reports last week he started rechecking his blood pressures and he has had rather elevated blood pressures above 190/100 on at least 2 occasions.  He states even the lower readings are in the 170s. BMP: 03/2018 creatinine 0.99 CBC: 03/2018 within normal limits TSH: 09/01/2017 1.06 Lipid: no insurance.  Diet: No routine diet Exercise: No routine exercise RF: Hypertension, overweight, smoker, family history present  OSA/pulmonary emphysema/shortness of breath: Patient reports he is compliant with his CPAP.  Anxiety: Patient reports he has been under more stress at work.  He is struggling with being able to continue his current job title secondary to arthritis.  He also is having other added stress at work which she does not go into great detail.  He has never been on anxiety medication in the past.  He does feel like he would benefit from a medication start. Allergies  Allergen Reactions  . No Known Allergies    Social History   Tobacco Use  .  Smoking status: Current Every Day Smoker    Packs/day: 1.00    Years: 30.00    Pack years: 30.00    Types: Cigarettes  . Smokeless tobacco: Never Used  . Tobacco comment: discussed benefits of quitting  Substance Use Topics  . Alcohol use: Yes    Alcohol/week: 12.0 standard drinks    Types: 12 Cans of beer per week    Comment: 12 beers on weekend   Past Medical History:  Diagnosis Date  . Allergy   . Arthritis   . Cigar smoker motivated to quit   . Hypertension   . OSA (obstructive sleep apnea) 2004   CPAP recommended- to meet with home health 02/26/16  . Wears dentures    Past Surgical History:  Procedure Laterality Date  . DENTAL SURGERY    . TOTAL KNEE ARTHROPLASTY Right 03/07/2016   Procedure: TOTAL KNEE ARTHROPLASTY;  Surgeon: Frederico Hamman, MD;  Location: [facility] OR;  Service: Orthopedics;  Laterality: Right;   Family History  Problem Relation Age of Onset  . Hypertension Mother   . COPD Mother   . Arthritis Mother   . Heart disease Mother        atrial fibrillation  . Emphysema Father   . Early death Father   . Arthritis Maternal Aunt   . Diabetes Maternal Aunt   . COPD Maternal Uncle   . Heart failure Maternal Grandfather    Allergies as of 06/21/2018      Reactions  No Known Allergies       Medication List       Accurate as of June 21, 2018 12:27 PM. Always use your most recent med list.        albuterol 108 (90 Base) MCG/ACT inhaler Commonly known as:  PROVENTIL HFA;VENTOLIN HFA Inhale 2 puffs into the lungs every 6 (six) hours as needed for wheezing or shortness of breath.   budesonide-formoterol 160-4.5 MCG/ACT inhaler Commonly known as:  SYMBICORT Inhale 2 puffs into the lungs 2 (two) times daily.   lisinopril 40 MG tablet Commonly known as:  PRINIVIL,ZESTRIL Take 1 tablet (40 mg total) by mouth daily.   omeprazole 40 MG capsule Commonly known as:  PRILOSEC TAKE 1 CAPSULE BY MOUTH EVERY DAY   PARoxetine 20 MG tablet Commonly known  as:  PAXIL Take 1 tablet (20 mg total) by mouth daily.      ROS: Negative, with the exception of above mentioned in HPI  Objective:  BP (!) 157/99   Pulse 72   Temp 97.9 F (36.6 C) (Oral)   Resp 18   Ht 6' (1.829 m)   Wt 224 lb (101.6 kg)   SpO2 90%   BMI 30.38 kg/m  Body mass index is 30.38 kg/m. Gen: Afebrile. No acute distress.  Nontoxic in presentation, well-developed, well-nourished, obese Caucasian male. HENT: AT. [place].  MMM.  Eyes:Pupils Equal Round Reactive to light, Extraocular movements intact,  Conjunctiva without redness, discharge or icterus. Neck/lymp/endocrine: Supple, no lymphadenopathy, no thyromegaly CV: RRR no murmur, no edema, +2/4 P posterior tibialis pulses Chest: CTAB, no crackles.  Bilateral wheezing present. Skin: no rashes, purpura or petechiae.  Neuro:  Normal gait. PERLA. EOMi. Alert. Oriented x3  Psych: Normal affect, dress and demeanor. Normal speech. Normal thought content and judgment.    Assessment/Plan: Todd Mercado is a 58 y.o. male present for acute OV for  Essential hypertension, benign/overweight -Pressures are quite elevated today and have been by patient reports for at least the last week.  inCrease lisinopril to 40 mg daily. -He is no longer insured.  Preventing further lab collection without financial burden. - F/U 3 mos w/ provider  OSA (obstructive sleep apnea)/Pulmonary emphysema, unspecified emphysema type (HCC) Patient was encouraged to restart Symbicort provided to him.  Albuterol also called in.   He reports compliance with CPAP machine.  Anxiety: New problem.  Most of his anxiety is surrounding work and his concern over not being able to complete his job.  Discussed different options with him today and he would like to start a medication. Paxil 20 mg daily started. 3 months follow-up unless needed sooner.  electronically signed by:  Felix Pacini, DO  [facility] - OR

## 2018-09-11 ENCOUNTER — Other Ambulatory Visit: Payer: Self-pay | Admitting: Family Medicine

## 2018-09-13 NOTE — Telephone Encounter (Signed)
Pt was called and will call back in the AM to make appt at the end of the week. Pt has a week or so worth of medications left. He verbalizes understanding he needs a to make appt for his 3 month CMC in order to receive refills.

## 2018-09-14 ENCOUNTER — Other Ambulatory Visit: Payer: Self-pay | Admitting: Family Medicine

## 2018-09-15 ENCOUNTER — Other Ambulatory Visit: Payer: Self-pay | Admitting: Family Medicine

## 2018-09-15 NOTE — Telephone Encounter (Signed)
Called and spoke with pt and he has not picked up the 40mg  tablets, he has been taking 2 (20mg ) tablets daily. Pharmacy was called and message was left that the 20mg  Lisinopril has been D/C and the pt is to take 40mg  daily. Pt verbalized understanding that I would call pharmacy and he should be getting his 40mg  tablets when he runs out.

## 2018-09-15 NOTE — Telephone Encounter (Signed)
I have refilled his paxil for him for 3 additional months.  I also received a request for the lisinopril 20 mg tab--> that is denied- he has a script for the lisinopril 40 mg tab and that is the one he should be using. He needs to call the pharmacy and ask for that script specifically.  He will need to follow up in 3 months - if BP not controlled on increase dose of lisinopril from last visit- then follow up is do now so we can alter regimen.

## 2018-12-14 ENCOUNTER — Encounter: Payer: Self-pay | Admitting: Family Medicine

## 2018-12-14 ENCOUNTER — Ambulatory Visit: Payer: Self-pay | Admitting: Family Medicine

## 2018-12-14 ENCOUNTER — Other Ambulatory Visit: Payer: Self-pay

## 2018-12-14 VITALS — BP 136/77 | HR 60 | Temp 98.0°F | Resp 19 | Ht 72.0 in | Wt 229.4 lb

## 2018-12-14 DIAGNOSIS — R1013 Epigastric pain: Secondary | ICD-10-CM

## 2018-12-14 DIAGNOSIS — R1031 Right lower quadrant pain: Secondary | ICD-10-CM

## 2018-12-14 DIAGNOSIS — I10 Essential (primary) hypertension: Secondary | ICD-10-CM

## 2018-12-14 DIAGNOSIS — F419 Anxiety disorder, unspecified: Secondary | ICD-10-CM

## 2018-12-14 DIAGNOSIS — R140 Abdominal distension (gaseous): Secondary | ICD-10-CM

## 2018-12-14 MED ORDER — OMEPRAZOLE 40 MG PO CPDR: DELAYED_RELEASE_CAPSULE | ORAL | 0 refills | Status: DC

## 2018-12-14 MED ORDER — PAROXETINE HCL 20 MG PO TABS: 20.0000 mg | ORAL_TABLET | Freq: Every day | ORAL | 1 refills | Status: DC

## 2018-12-14 MED ORDER — AMLODIPINE BESYLATE 10 MG PO TABS: 10.0000 mg | ORAL_TABLET | Freq: Every day | ORAL | 3 refills | Status: DC

## 2018-12-14 NOTE — Patient Instructions (Signed)
Monitor BP with medication change.  Start amlodipine instead of lisinopril. Hopefully the cough will stop.   If BP is >135/85 routinely after change make an appointment sooner-otherwise we will see you in 6 months.    Todd Mercado with the smoking cessation.

## 2018-12-14 NOTE — Progress Notes (Signed)
Patient ID: Todd Mercado, male   DOB: 02/14/1961, 58 y.o.   MRN: [id number]    Todd Mercado , 02/14/1961, 58 y.o., male MRN: [id number]   Patient Care Team    Relationship Specialty Notifications Start End  Natalia LeatherwoodKuneff, Renee A, DO PCP - General Family Medicine  07/02/15   Frederico Hammanaffrey, Daniel, MD Consulting Physician Orthopedic Surgery  09/04/17      Chief Complaint  Patient presents with  . Anxiety  . Hypertension    Pt has had a cough since starting HTN medication. Trying to quit smoking.    Patient unfortunately is self-pay, labs will have to be postponed until insured again.  Subjective:  Hypertension/tobacco use: Pt reports compliance with lisinopril 40 mg daily. Blood pressures ranges at home are have been ok- he states he did develop a cough after starting the higher dose lisinopri. Patient denies chest pain, shortness of breath, dizziness or lower extremity edema. Pt does not take a daily baby ASA. Pt is not prescribed statin.  He continues to smoke daily- but he is trying to quit before Novemeber.   BMP: 03/2018 creatinine 0.99 CBC: 03/2018 within normal limits TSH: 09/01/2017 1.06 Lipid: no insurance.  Diet: No routine diet Exercise: No routine exercise RF: Hypertension, overweight, smoker, family history present  OSA/pulmonary emphysema: Patient reports he is compliant with his CPAP. He has been using his Symbicort as labeled.   Anxiety: He reports he is doing well on paxil 20 mg started last visit.  Prior note: Patient reports he has been under more stress at work.  He is struggling with being able to continue his current job title secondary to arthritis.  He also is having other added stress at work which she does not go into great detail.  He has never been on anxiety medication in the past.  He does feel like he would benefit from a medication start.  GAD 7 : Generalized Anxiety Score 12/14/2018  Nervous, Anxious, on Edge 1  Control/stop worrying 1  Worry too much - different  things 0  Trouble relaxing 2  Restless 0  Easily annoyed or irritable 1  Afraid - awful might happen 0  Total GAD 7 Score 5  Anxiety Difficulty Somewhat difficult     Allergies  Allergen Reactions  . No Known Allergies    Social History   Tobacco Use  . Smoking status: Current Every Day Smoker    Packs/day: 1.00    Years: 30.00    Pack years: 30.00    Types: Cigarettes  . Smokeless tobacco: Never Used  . Tobacco comment: discussed benefits of quitting  Substance Use Topics  . Alcohol use: Yes    Alcohol/week: 12.0 standard drinks    Types: 12 Cans of beer per week    Comment: 12 beers on weekend   Past Medical History:  Diagnosis Date  . Allergy   . Arthritis   . Cigar smoker motivated to quit   . Hypertension   . OSA (obstructive sleep apnea) 2004   CPAP recommended- to meet with home health 02/26/16  . Wears dentures    Past Surgical History:  Procedure Laterality Date  . DENTAL SURGERY    . TOTAL KNEE ARTHROPLASTY Right 03/07/2016   Procedure: TOTAL KNEE ARTHROPLASTY;  Surgeon: Frederico Hammananiel Caffrey, MD;  [facility];  Service: Orthopedics;  Laterality: Right;   Family History  Problem Relation Age of Onset  . Hypertension Mother   . COPD Mother   . Arthritis Mother   .  Heart disease Mother        atrial fibrillation  . Emphysema Father   . Early death Father   . Arthritis Maternal Aunt   . Diabetes Maternal Aunt   . COPD Maternal Uncle   . Heart failure Maternal Grandfather    Allergies as of 12/14/2018      Reactions   No Known Allergies       Medication List       Accurate as of December 14, 2018  1:12 PM. If you have any questions, ask your nurse or doctor.        albuterol 108 (90 Base) MCG/ACT inhaler Commonly known as: VENTOLIN HFA Inhale 2 puffs into the lungs every 6 (six) hours as needed for wheezing or shortness of breath.   budesonide-formoterol 160-4.5 MCG/ACT inhaler Commonly known as: SYMBICORT Inhale 2 puffs into the lungs 2  (two) times daily.   lisinopril 40 MG tablet Commonly known as: ZESTRIL Take 1 tablet (40 mg total) by mouth daily.   omeprazole 40 MG capsule Commonly known as: PRILOSEC TAKE 1 CAPSULE BY MOUTH EVERY DAY   PARoxetine 20 MG tablet Commonly known as: PAXIL TAKE 1 TABLET BY MOUTH EVERY DAY      ROS: Negative, with the exception of above mentioned in HPI  Objective:  BP 136/77 (BP Location: Left Arm, Patient Position: Sitting, Cuff Size: Normal)   Pulse 60   Temp 98 F (36.7 C) (Temporal)   Resp 19   Ht 6' (1.829 m)   Wt 229 lb 6 oz (104 kg)   SpO2 95%   BMI 31.11 kg/m  Body mass index is 31.11 kg/m. Gen: Afebrile. No acute distress. Nontoxic, obese, caucasian male.  HENT: AT. [place].  MMM.  Eyes:Pupils Equal Round Reactive to light, Extraocular movements intact,  Conjunctiva without redness, discharge or icterus. CV: RRR no murmur, no edema, +2/4 P posterior tibialis pulses Chest: CTAB, no wheeze or crackles.Cough present Abd: Soft. obese. NTND. BS present. no Masses palpated.  Neuro:  Normal gait. PERLA. EOMi. Alert. Oriented x3 Psych: Normal affect, dress and demeanor. Normal speech. Normal thought content and judgment.  Assessment/Plan: Darriel Utter is a 58 y.o. male present for acute OV for  Essential hypertension, benign/obese - Stable, but developed cough at higher dose of lisinopril  DC lisinopril- start amlodipine 10 mg QD . Low sodium Diet, exercise Stop smoking.  F/U 6 mos. Labs if he has insurance by then.   OSA (obstructive sleep apnea)/Pulmonary emphysema, unspecified emphysema type (HCC) Continue  Symbicort. Albuterol PRN.  He reports compliance with CPAP machine.  Anxiety: Stable.  Paxil 20 mg daily continued 6 months follow up  > 25 minutes spent with patient, >50% of time spent face to face     electronically signed by:  Howard Pouch, DO  [place]

## 2019-03-22 ENCOUNTER — Telehealth: Payer: Self-pay | Admitting: Family Medicine

## 2019-03-22 NOTE — Telephone Encounter (Signed)
Patient only has 2 pills left of bp med. Patient's bp has been very good on his new medication. Please send Rx to [facility]. Patient did make 6 month follow up visit appointment while on the phone with me.

## 2019-03-22 NOTE — Telephone Encounter (Signed)
Pharmacy was called and all his medications are ready to be picked up. Pt was called and given information.

## 2019-05-31 ENCOUNTER — Ambulatory Visit (INDEPENDENT_AMBULATORY_CARE_PROVIDER_SITE_OTHER): Payer: Self-pay | Admitting: Family Medicine

## 2019-05-31 ENCOUNTER — Encounter: Payer: Self-pay | Admitting: Family Medicine

## 2019-05-31 VITALS — Ht 72.0 in

## 2019-05-31 DIAGNOSIS — F17200 Nicotine dependence, unspecified, uncomplicated: Secondary | ICD-10-CM

## 2019-05-31 DIAGNOSIS — J439 Emphysema, unspecified: Secondary | ICD-10-CM

## 2019-05-31 DIAGNOSIS — J441 Chronic obstructive pulmonary disease with (acute) exacerbation: Secondary | ICD-10-CM

## 2019-05-31 MED ORDER — ALBUTEROL SULFATE HFA 108 (90 BASE) MCG/ACT IN AERS: 2.0000 | INHALATION_SPRAY | Freq: Four times a day (QID) | RESPIRATORY_TRACT | 1 refills | Status: DC | PRN

## 2019-05-31 MED ORDER — FLUTICASONE PROPIONATE 50 MCG/ACT NA SUSP: 2.0000 | Freq: Every day | NASAL | 6 refills | Status: DC

## 2019-05-31 MED ORDER — DOXYCYCLINE HYCLATE 100 MG PO TABS: 100.0000 mg | ORAL_TABLET | Freq: Two times a day (BID) | ORAL | 0 refills | Status: DC

## 2019-05-31 MED ORDER — PREDNISONE 20 MG PO TABS: ORAL_TABLET | ORAL | 0 refills | Status: DC

## 2019-05-31 NOTE — Progress Notes (Signed)
VIRTUAL VISIT VIA VIDEO  I connected with Todd Mercado on 05/31/19 at  4:00 PM EST by a video enabled telemedicine application and verified that I am speaking with the correct person using two identifiers. Location patient: Home Location provider: [facility], Office Persons participating in the virtual visit: Patient, Dr. Claiborne Billings and R.Baker, LPN  I discussed the limitations of evaluation and management by telemedicine and the availability of in person appointments. The patient expressed understanding and agreed to proceed.   SUBJECTIVE Chief Complaint  Patient presents with  . Nasal Congestion    Pt thinks he had COVID (negative test) and has not felt good since Thanksgiving. O2 continues to to run between 92-95%.   . Cough  . Shortness of Breath    HPI: Todd Mercado is a 59 y.o. male present for upper resp symptoms.  She reports he had family members who tested positive for Covid around the end of November.  He had similar symptoms but tested negative.  Since that time he has noticed an intermittent cough and feeling mild short of breath mostly with activity/working.  He has had a congested head and chest as well since that time.  He has a history of COPD.  He is a smoker.  He has not been taking his Symbicort secondary to cost.  He states he does have insurance now and may be able to afford an inhaler for control.  He has not had an albuterol inhaler.  He denies fever, chills, nausea, vomit or productive cough.  He denies chest pain, dizziness, syncope or lower extremity edema  ROS: See pertinent positives and negatives per HPI.  Patient Active Problem List   Diagnosis Date Noted  . Anxiety 06/21/2018  . Pulmonary emphysema (HCC) 02/27/2016  . Morbid obesity (HCC) 01/24/2016  . OSA (obstructive sleep apnea) 01/24/2016  . 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 History of ETOH abuse 03/11/2013  . Essential hypertension, benign 02/11/2013    Social History   Tobacco Use  .  Smoking status: Current Every Day Smoker    Packs/day: 1.00    Years: 30.00    Pack years: 30.00    Types: Cigarettes  . Smokeless tobacco: Never Used  . Tobacco comment: discussed benefits of quitting  Substance Use Topics  . Alcohol use: Yes    Alcohol/week: 12.0 standard drinks    Types: 12 Cans of beer per week    Comment: 12 beers on weekend    Current Outpatient Medications:  .  albuterol (PROVENTIL HFA;VENTOLIN HFA) 108 (90 Base) MCG/ACT inhaler, Inhale 2 puffs into the lungs every 6 (six) hours as needed for wheezing or shortness of breath., Disp: 1 Inhaler, Rfl: 0 .  amLODipine (NORVASC) 10 MG tablet, Take 1 tablet (10 mg total) by mouth daily., Disp: 90 tablet, Rfl: 3 .  omeprazole (PRILOSEC) 40 MG capsule, TAKE 1 CAPSULE BY MOUTH EVERY DAY, Disp: 90 capsule, Rfl: 0 .  PARoxetine (PAXIL) 20 MG tablet, Take 1 tablet (20 mg total) by mouth daily., Disp: 90 tablet, Rfl: 1 .  budesonide-formoterol (SYMBICORT) 160-4.5 MCG/ACT inhaler, Inhale 2 puffs into the lungs 2 (two) times daily. (Patient not taking: Reported on 05/31/2019), Disp: 1 Inhaler, Rfl: 11  Allergies  Allergen Reactions  . No Known Allergies     OBJECTIVE: Ht 6' (1.829 m)   BMI 31.11 kg/m  Gen: No acute distress. Nontoxic in appearance.  HENT: AT. [place].  MMM.  Eyes:Pupils Equal Round Reactive to light, Extraocular movements intact,  Conjunctiva without redness, discharge or icterus. CV: no edema Chest: Cough or shortness of breath not present Neuro:  Normal gait. Alert. Oriented x3  Psych: Normal affect, dress and demeanor. Normal speech. Normal thought content and judgment.  ASSESSMENT AND PLAN: Todd Mercado is a 59 y.o. male present for  COPD with acute exacerbation (HCC)/Current every day smoker Rest, hydrate.  +/- flonase, mucinex (DM if cough), nettie pot or nasal saline.  Prednisone and doxycycline prescribed, take until completed.  Albuterol prescribed.  Patient was asked to call back once he found  out what COPD inhaler is on his formulary.  He did not hear from him so tried to call in Pulmicort. If cough present it can last up to 6-8 weeks.  F/U 2 weeks of not improved.   No orders of the defined types were placed in this encounter.  Meds ordered this encounter  Medications  . doxycycline (VIBRA-TABS) 100 MG tablet    Sig: Take 1 tablet (100 mg total) by mouth 2 (two) times daily.    Dispense:  20 tablet    Refill:  0  . predniSONE (DELTASONE) 20 MG tablet    Sig: 60 mg x3d, 40 mg x3d, 20 mg x2d, 10 mg x2d    Dispense:  18 tablet    Refill:  0  . albuterol (VENTOLIN HFA) 108 (90 Base) MCG/ACT inhaler    Sig: Inhale 2 puffs into the lungs every 6 (six) hours as needed for wheezing or shortness of breath.    Dispense:  6.7 g    Refill:  1  . fluticasone (FLONASE) 50 MCG/ACT nasal spray    Sig: Place 2 sprays into both nostrils daily.    Dispense:  16 g    Refill:  6  . Budesonide 90 MCG/ACT inhaler    Sig: Inhale 1 puff into the lungs 2 (two) times daily.    Dispense:  1 each    Refill:  [place], DO 05/31/2019

## 2019-06-02 ENCOUNTER — Encounter: Payer: Self-pay | Admitting: Family Medicine

## 2019-06-02 DIAGNOSIS — F17200 Nicotine dependence, unspecified, uncomplicated: Secondary | ICD-10-CM

## 2019-06-02 DIAGNOSIS — J441 Chronic obstructive pulmonary disease with (acute) exacerbation: Secondary | ICD-10-CM | POA: Insufficient documentation

## 2019-06-02 HISTORY — DX: Nicotine dependence, unspecified, uncomplicated: F17.200

## 2019-06-02 MED ORDER — BUDESONIDE 90 MCG/ACT IN AEPB: 1.0000 | INHALATION_SPRAY | Freq: Two times a day (BID) | RESPIRATORY_TRACT | 5 refills | Status: DC

## 2019-06-02 NOTE — Patient Instructions (Signed)
COVID-19 Vaccine Information can be found at: [email or web address] For questions related to vaccine distribution or appointments, please email [email or web address] or call [phone].  Covid Vaccine appointment go to [email or web address].   

## 2019-06-10 ENCOUNTER — Encounter: Payer: Self-pay | Admitting: Family Medicine

## 2019-06-10 ENCOUNTER — Other Ambulatory Visit: Payer: Self-pay

## 2019-06-10 ENCOUNTER — Ambulatory Visit (INDEPENDENT_AMBULATORY_CARE_PROVIDER_SITE_OTHER): Payer: BLUE CROSS/BLUE SHIELD | Admitting: Family Medicine

## 2019-06-10 VITALS — BP 129/77 | HR 54 | Temp 98.0°F | Resp 19 | Ht 72.0 in | Wt 233.1 lb

## 2019-06-10 DIAGNOSIS — F419 Anxiety disorder, unspecified: Secondary | ICD-10-CM | POA: Diagnosis not present

## 2019-06-10 DIAGNOSIS — J441 Chronic obstructive pulmonary disease with (acute) exacerbation: Secondary | ICD-10-CM | POA: Diagnosis not present

## 2019-06-10 DIAGNOSIS — G4733 Obstructive sleep apnea (adult) (pediatric): Secondary | ICD-10-CM

## 2019-06-10 DIAGNOSIS — J439 Emphysema, unspecified: Secondary | ICD-10-CM

## 2019-06-10 DIAGNOSIS — F17200 Nicotine dependence, unspecified, uncomplicated: Secondary | ICD-10-CM

## 2019-06-10 DIAGNOSIS — Z1211 Encounter for screening for malignant neoplasm of colon: Secondary | ICD-10-CM

## 2019-06-10 DIAGNOSIS — I10 Essential (primary) hypertension: Secondary | ICD-10-CM

## 2019-06-10 DIAGNOSIS — Z23 Encounter for immunization: Secondary | ICD-10-CM | POA: Diagnosis not present

## 2019-06-10 MED ORDER — PAROXETINE HCL 20 MG PO TABS: 20.0000 mg | ORAL_TABLET | Freq: Every day | ORAL | 1 refills | Status: DC

## 2019-06-10 MED ORDER — BUDESONIDE-FORMOTEROL FUMARATE 160-4.5 MCG/ACT IN AERO: 2.0000 | INHALATION_SPRAY | Freq: Two times a day (BID) | RESPIRATORY_TRACT | 3 refills | Status: DC

## 2019-06-10 MED ORDER — ALBUTEROL SULFATE HFA 108 (90 BASE) MCG/ACT IN AERS: 2.0000 | INHALATION_SPRAY | Freq: Four times a day (QID) | RESPIRATORY_TRACT | 11 refills | Status: DC | PRN

## 2019-06-10 MED ORDER — AMLODIPINE BESYLATE 10 MG PO TABS: 10.0000 mg | ORAL_TABLET | Freq: Every day | ORAL | 3 refills | Status: DC

## 2019-06-10 NOTE — Patient Instructions (Addendum)
COVID-19 Vaccine Information can be found at: [email or web address] For questions related to vaccine distribution or appointments, please email [email or web address] or call [phone].  Covid Vaccine appointment go to [email or web address].    Chronic Obstructive Pulmonary Disease Chronic obstructive pulmonary disease (COPD) is a long-term (chronic) lung problem. When you have COPD, it is hard for air to get in and out of your lungs. Usually the condition gets worse over time, and your lungs will never return to normal. There are things you can do to keep yourself as healthy as possible.  Your doctor may treat your condition with: ? Medicines. ? Oxygen. ? Lung surgery.  Your doctor may also recommend: ? Rehabilitation. This includes steps to make your body work better. It may involve a team of specialists. ? Quitting smoking, if you smoke. ? Exercise and changes to your diet. ? Comfort measures (palliative care). Follow these instructions at home: Medicines  Take over-the-counter and prescription medicines only as told by your doctor.  Talk to your doctor before taking any cough or allergy medicines. You may need to avoid medicines that cause your lungs to be dry. Lifestyle  If you smoke, stop. Smoking makes the problem worse. If you need help quitting, ask your doctor.  Avoid being around things that make your breathing worse. This may include smoke, chemicals, and fumes.  Stay active, but remember to rest as well.  Learn and use tips on how to relax.  Make sure you get enough sleep. Most adults need at least 7 hours of sleep every night.  Eat healthy foods. Eat smaller meals more often. Rest before meals. Controlled breathing Learn and use tips on how to control your breathing as told by your doctor. Try:  Breathing in (inhaling) through your nose for 1 second. Then, pucker your lips and breath out (exhale)  through your lips for 2 seconds.  Putting one hand on your belly (abdomen). Breathe in slowly through your nose for 1 second. Your hand on your belly should move out. Pucker your lips and breathe out slowly through your lips. Your hand on your belly should move in as you breathe out.  Controlled coughing Learn and use controlled coughing to clear mucus from your lungs. Follow these steps: 1. Lean your head a little forward. 2. Breathe in deeply. 3. Try to hold your breath for 3 seconds. 4. Keep your mouth slightly open while coughing 2 times. 5. Spit any mucus out into a tissue. 6. Rest and do the steps again 1 or 2 times as needed. General instructions  Make sure you get all the shots (vaccines) that your doctor recommends. Ask your doctor about a flu shot and a pneumonia shot.  Use oxygen therapy and pulmonary rehabilitation if told by your doctor. If you need home oxygen therapy, ask your doctor if you should buy a tool to measure your oxygen level (oximeter).  Make a COPD action plan with your doctor. This helps you to know what to do if you feel worse than usual.  Manage any other conditions you have as told by your doctor.  Avoid going outside when it is very hot, cold, or humid.  Avoid people who have a sickness you can catch (contagious).  Keep all follow-up visits as told by your doctor. This is important. Contact a doctor if:  You cough up more mucus than usual.  There is a change in the color or thickness of the mucus.  It is harder to breathe than usual.  Your breathing  is faster than usual.  You have trouble sleeping.  You need to use your medicines more often than usual.  You have trouble doing your normal activities such as getting dressed or walking around the house. Get help right away if:  You have shortness of breath while resting.  You have shortness of breath that stops you from: ? Being able to talk. ? Doing normal activities.  Your chest hurts  for longer than 5 minutes.  Your skin color is more blue than usual.  Your pulse oximeter shows that you have low oxygen for longer than 5 minutes.  You have a fever.  You feel too tired to breathe normally. Summary  Chronic obstructive pulmonary disease (COPD) is a long-term lung problem.  The way your lungs work will never return to normal. Usually the condition gets worse over time. There are things you can do to keep yourself as healthy as possible.  Take over-the-counter and prescription medicines only as told by your doctor.  If you smoke, stop. Smoking makes the problem worse. This information is not intended to replace advice given to you by your health care provider. Make sure you discuss any questions you have with your health care provider. Document Revised: 04/03/2017 Document Reviewed: 05/26/2016 Elsevier Patient Education  2020 [employer].

## 2019-06-10 NOTE — Progress Notes (Signed)
Patient ID: Todd Mercado, male   DOB: 1960/05/07, 59 y.o.   MRN: [id number]    Todd Mercado , April 26, 1961, 59 y.o., male MRN: [id number]   Patient Care Team    Relationship Specialty Notifications Start End  Ma Hillock, DO PCP - General Family Medicine  07/02/15   Earlie Server, MD Consulting Physician Orthopedic Surgery  09/04/17      Chief Complaint  Patient presents with  . Hypertension    Pt does not check BP at home     Subjective: Todd Mercado is a 59 y.o. male present for [facility] follow up.  Hypertension/tobacco use: Pt reports compliance with amlodipine 10 mg daily. Patient denies chest pain, shortness of breath, dizziness or lower extremity edema.  Pt does not take a daily baby ASA. Pt is not prescribed statin.  He continues to smoke daily- but he is trying to quit. BMP: 03/2018 creatinine 0.99 CBC: 03/2018 within normal limits TSH: 09/01/2017 1.06 Lipid: no insurance.  Diet: No routine diet Exercise: No routine exercise RF: Hypertension, overweight, smoker, family history present  OSA/pulmonary emphysema: Patient reports he is compliance with his CPAP.  He has not been able to afford his Symbicort.  He reports he did call his insurance company today and was told he would need to have a list of all the possible meds prescribed for them tell him which one is on his tier 1 or COPD inhalers.  He was recently treated for COPD exacerbation and he states he felt like he could breathe much better when he was on the prednisone however the prednisone stopped 2 days ago.  Anxiety: He reports he is doing well on paxil 20 m. Prior note: Patient reports he has been under more stress at work.  He is struggling with being able to continue his current job title secondary to arthritis.  He also is having other added stress at work which she does not go into great detail.  He has never been on anxiety medication in the past.  He does feel like he would benefit from a medication start.  GAD 7  : Generalized Anxiety Score 12/14/2018  Nervous, Anxious, on Edge 1  Control/stop worrying 1  Worry too much - different things 0  Trouble relaxing 2  Restless 0  Easily annoyed or irritable 1  Afraid - awful might happen 0  Total GAD 7 Score 5  Anxiety Difficulty Somewhat difficult   Allergies  Allergen Reactions  . No Known Allergies    Social History   Tobacco Use  . Smoking status: Current Every Day Smoker    Packs/day: 1.00    Years: 30.00    Pack years: 30.00    Types: Cigarettes  . Smokeless tobacco: Never Used  . Tobacco comment: discussed benefits of quitting  Substance Use Topics  . Alcohol use: Yes    Alcohol/week: 12.0 standard drinks    Types: 12 Cans of beer per week    Comment: 12 beers on weekend   Past Medical History:  Diagnosis Date  . Allergy   . Arthritis   . Cigar smoker motivated to quit   . Hypertension   . OSA (obstructive sleep apnea) 2004   CPAP recommended- to meet with home health 02/26/16  . Wears dentures    Past Surgical History:  Procedure Laterality Date  . DENTAL SURGERY    . TOTAL KNEE ARTHROPLASTY Right 03/07/2016   Procedure: TOTAL KNEE ARTHROPLASTY;  Surgeon: Earlie Server, MD;  Location: [place];  Service: Orthopedics;  Laterality: Right;   Family History  Problem Relation Age of Onset  . Hypertension Mother   . COPD Mother   . Arthritis Mother   . Heart disease Mother        atrial fibrillation  . Emphysema Father   . Early death Father   . Arthritis Maternal Aunt   . Diabetes Maternal Aunt   . COPD Maternal Uncle   . Heart failure Maternal Grandfather    Allergies as of 06/10/2019      Reactions   No Known Allergies       Medication List       Accurate as of June 10, 2019  4:16 PM. If you have any questions, ask your nurse or doctor.        STOP taking these medications   Budesonide 90 MCG/ACT inhaler Stopped by: Todd Pouch, DO   doxycycline 100 MG tablet Commonly known as: VIBRA-TABS Stopped by:  Todd Pouch, DO   omeprazole 40 MG capsule Commonly known as: PRILOSEC Stopped by: Todd Pouch, DO   predniSONE 20 MG tablet Commonly known as: DELTASONE Stopped by: Todd Pouch, DO     TAKE these medications   albuterol 108 (90 Base) MCG/ACT inhaler Commonly known as: VENTOLIN HFA Inhale 2 puffs into the lungs every 6 (six) hours as needed for wheezing or shortness of breath.   amLODipine 10 MG tablet Commonly known as: NORVASC Take 1 tablet (10 mg total) by mouth daily.   budesonide-formoterol 160-4.5 MCG/ACT inhaler Commonly known as: SYMBICORT Inhale 2 puffs into the lungs 2 (two) times daily. Started by: Todd Pouch, DO   fluticasone 50 MCG/ACT nasal spray Commonly known as: FLONASE Place 2 sprays into both nostrils daily.   PARoxetine 20 MG tablet Commonly known as: PAXIL Take 1 tablet (20 mg total) by mouth daily.      ROS: Negative, with the exception of above mentioned in HPI  Objective:  BP 129/77 (BP Location: Left Arm, Patient Position: Sitting, Cuff Size: Normal)   Pulse (!) 54   Temp 98 F (36.7 C) (Temporal)   Resp 19   Ht 6' (1.829 m)   Wt 233 lb 2 oz (105.7 kg)   SpO2 93%   BMI 31.62 kg/m  Body mass index is 31.62 kg/m. Gen: Afebrile. No acute distress.  Nontoxic in presentation, well-developed, well-nourished, Caucasian male. HENT: AT. [place].  Mild coughing present on exam.   Eyes:Pupils Equal Round Reactive to light, Extraocular movements intact,  Conjunctiva without redness, discharge or icterus. Neck/lymp/endocrine: Supple, no lymphadenopathy, no thyromegaly CV: RRR no murmur, trace edema Chest: Managed air sounds bilaterally.  Normal work of breath.  O2 saturation 93%. Abd: Soft. NTND. BS present.  No masses palpated.  Neuro:  Normal gait. PERLA. EOMi. Alert. Oriented x3  Psych: Normal affect, dress and demeanor. Normal speech. Normal thought content and judgment.   Assessment/Plan: Jiles Goya is a 59 y.o. male present for acute OV  for  Essential hypertension, benign/obese - Stable, but developed cough at higher dose of lisinopril  DC lisinopril- start amlodipine 10 mg QD . Low sodium Diet, exercise Stop smoking.  F/U 6 mos. Labs if he has insurance by then.   OSA (obstructive sleep apnea)/Pulmonary emphysema, unspecified emphysema type ([place]) -Unfortunately he has been unable to afford his COPD inhaler. -Called in Symbicort today, may need to try Advair or Qvar.  His insurance company is not being cooperative and will not communicate what is on his tier 1. -  Continue albuterol as needed -Compliant with CPAP  Anxiety: Stable. Paxil 20 mg daily continued 6 months follow up  Meds ordered this encounter  Medications  . amLODipine (NORVASC) 10 MG tablet    Sig: Take 1 tablet (10 mg total) by mouth daily.    Dispense:  90 tablet    Refill:  3    DC lisinopril- med change  . PARoxetine (PAXIL) 20 MG tablet    Sig: Take 1 tablet (20 mg total) by mouth daily.    Dispense:  90 tablet    Refill:  1  . albuterol (VENTOLIN HFA) 108 (90 Base) MCG/ACT inhaler    Sig: Inhale 2 puffs into the lungs every 6 (six) hours as needed for wheezing or shortness of breath.    Dispense:  6.7 g    Refill:  11    Hold until request  . budesonide-formoterol (SYMBICORT) 160-4.5 MCG/ACT inhaler    Sig: Inhale 2 puffs into the lungs 2 (two) times daily.    Dispense:  1 Inhaler    Refill:  3   Orders Placed This Encounter  Procedures  . Flu Vaccine QUAD 36+ mos IM  . Comp Met (CMET)  . CBC  . Lipid panel  . TSH  . Cologuard   Referral Orders  No referral(s) requested today     electronically signed by:  Todd Pouch, DO  [place]

## 2019-06-11 LAB — TSH: TSH: 1.8 mIU/L (ref 0.40–4.50)

## 2019-06-11 LAB — COMPREHENSIVE METABOLIC PANEL
AG Ratio: 1.4 (calc) (ref 1.0–2.5)
ALT: 25 U/L (ref 9–46)
AST: 17 U/L (ref 10–35)
Albumin: 3.9 g/dL (ref 3.6–5.1)
Alkaline phosphatase (APISO): 57 U/L (ref 35–144)
BUN: 18 mg/dL (ref 7–25)
CO2: 32 mmol/L (ref 20–32)
Calcium: 9.5 mg/dL (ref 8.6–10.3)
Chloride: 95 mmol/L — ABNORMAL LOW (ref 98–110)
Creat: 1.01 mg/dL (ref 0.70–1.33)
Globulin: 2.8 g/dL (calc) (ref 1.9–3.7)
Glucose, Bld: 83 mg/dL (ref 65–99)
Potassium: 4.7 mmol/L (ref 3.5–5.3)
Sodium: 135 mmol/L (ref 135–146)
Total Bilirubin: 0.5 mg/dL (ref 0.2–1.2)
Total Protein: 6.7 g/dL (ref 6.1–8.1)

## 2019-06-11 LAB — CBC
HCT: 45.8 % (ref 38.5–50.0)
Hemoglobin: 15.8 g/dL (ref 13.2–17.1)
MCH: 31 pg (ref 27.0–33.0)
MCHC: 34.5 g/dL (ref 32.0–36.0)
MCV: 90 fL (ref 80.0–100.0)
MPV: 9.7 fL (ref 7.5–12.5)
Platelets: 201 10*3/uL (ref 140–400)
RBC: 5.09 10*6/uL (ref 4.20–5.80)
RDW: 13.4 % (ref 11.0–15.0)
WBC: 11.7 10*3/uL — ABNORMAL HIGH (ref 3.8–10.8)

## 2019-06-11 LAB — LIPID PANEL
Cholesterol: 171 mg/dL (ref <200)
HDL: 59 mg/dL (ref >=40)
LDL Cholesterol (Calc): 75 mg/dL (calc)
Non-HDL Cholesterol (Calc): 112 mg/dL (calc) (ref <130)
Total CHOL/HDL Ratio: 2.9 (calc) (ref <5.0)
Triglycerides: 280 mg/dL — ABNORMAL HIGH (ref <150)

## 2019-06-13 ENCOUNTER — Telehealth: Payer: Self-pay | Admitting: Family Medicine

## 2019-06-13 MED ORDER — PREDNISONE 50 MG PO TABS: 50.0000 mg | ORAL_TABLET | Freq: Every day | ORAL | 0 refills | Status: DC

## 2019-06-13 MED ORDER — AZITHROMYCIN 500 MG PO TABS: ORAL_TABLET | ORAL | 0 refills | Status: DC

## 2019-06-13 NOTE — Telephone Encounter (Signed)
Pt was called and given results/instructions, he verbalized understanding  

## 2019-06-13 NOTE — Telephone Encounter (Signed)
Please inform patient the following information: Liver, kidney, thyroid function is normal. Cholesterol panel looks good and is at goal.   He still has a mildly elevated white count-this could be secondary to infection, recent steroid use and/or his smoking history.  All can increase WBC cells.  To be cautious since he had a recent bronchitis/COPD flare and his lungs exam was still abnormal, I have called in a Z-Pak and a short course of additional prednisone.  -  I strongly encouraged him to start the daily inhaler.  It appears all of the COPD inhalers Symbicort, Advair, Qvar are all on the same tier if the Symbicort is too expensive for him he has to call his insurance company and find out which of the 3 above is cheapest for him.  He could also ask the pharmacist if they could run the 3 above and see which is cheapest and/or if they have discount cards for the above.   Follow-up in 5-1/2 months on his chronic conditions.  Sooner if needed

## 2019-08-16 LAB — COLOGUARD: Cologuard: NEGATIVE

## 2019-10-14 ENCOUNTER — Telehealth: Payer: Self-pay

## 2019-10-14 ENCOUNTER — Other Ambulatory Visit: Payer: Self-pay | Admitting: Family Medicine

## 2019-10-14 NOTE — Telephone Encounter (Signed)
Patient called and states he no longer hs [employer] as insurance anymore and now has medicaid he trying to get his medication refilled and showed the pharmacy his medicaid card. They informed him that the Dr office had to send something in, in order for him to be covered with his insurance and to get the refill. Patient is going out of town in the morning and needs the medication before he leaves. States he just received his card today     Please call and advise

## 2019-10-14 NOTE — Telephone Encounter (Signed)
Pt was called back and they did receive refill but he said pharmacy had to fax [place] paperwork because the insurance company needed some paperwork filled out. Pt was asked if it was a PA and he said yes he thinks that's what they called it. Pt advised even if I got the paperwork the PA would not be completed by tomorrow. Pt was given good RX information and he would just buy a inhaler out of pocket to be able to take with him. He was advised I would watch for paperwork to come.    Annabelle Harman this pt is now Medicaid, [place]

## 2019-11-14 NOTE — Telephone Encounter (Signed)
Paperwork came from pharmacy that Generic or name brand would not be covered under medicaid plan. Pt was called and told he would need to call insurance company and ask what they would pay for that is similar to symbicort and let us know.

## 2019-12-19 ENCOUNTER — Other Ambulatory Visit: Payer: Self-pay

## 2019-12-19 DIAGNOSIS — J441 Chronic obstructive pulmonary disease with (acute) exacerbation: Secondary | ICD-10-CM

## 2019-12-19 MED ORDER — BUDESONIDE-FORMOTEROL FUMARATE 160-4.5 MCG/ACT IN AERO: INHALATION_SPRAY | RESPIRATORY_TRACT | 0 refills | Status: DC

## 2019-12-30 ENCOUNTER — Ambulatory Visit: Payer: BLUE CROSS/BLUE SHIELD | Admitting: Family Medicine

## 2020-01-23 ENCOUNTER — Other Ambulatory Visit: Payer: Self-pay | Admitting: Family Medicine

## 2020-01-23 DIAGNOSIS — J441 Chronic obstructive pulmonary disease with (acute) exacerbation: Secondary | ICD-10-CM

## 2020-01-27 ENCOUNTER — Other Ambulatory Visit: Payer: Self-pay

## 2020-01-27 ENCOUNTER — Ambulatory Visit (INDEPENDENT_AMBULATORY_CARE_PROVIDER_SITE_OTHER): Payer: Self-pay | Admitting: Family Medicine

## 2020-01-27 ENCOUNTER — Encounter: Payer: Self-pay | Admitting: Family Medicine

## 2020-01-27 VITALS — BP 120/73 | HR 71 | Temp 98.8°F | Ht 72.0 in | Wt 219.0 lb

## 2020-01-27 DIAGNOSIS — J439 Emphysema, unspecified: Secondary | ICD-10-CM

## 2020-01-27 DIAGNOSIS — G479 Sleep disorder, unspecified: Secondary | ICD-10-CM | POA: Insufficient documentation

## 2020-01-27 DIAGNOSIS — G4733 Obstructive sleep apnea (adult) (pediatric): Secondary | ICD-10-CM

## 2020-01-27 DIAGNOSIS — F17200 Nicotine dependence, unspecified, uncomplicated: Secondary | ICD-10-CM

## 2020-01-27 DIAGNOSIS — Z23 Encounter for immunization: Secondary | ICD-10-CM

## 2020-01-27 DIAGNOSIS — I10 Essential (primary) hypertension: Secondary | ICD-10-CM

## 2020-01-27 MED ORDER — ALBUTEROL SULFATE HFA 108 (90 BASE) MCG/ACT IN AERS: 2.0000 | INHALATION_SPRAY | Freq: Four times a day (QID) | RESPIRATORY_TRACT | 11 refills | Status: DC | PRN

## 2020-01-27 MED ORDER — FLUTICASONE PROPIONATE 50 MCG/ACT NA SUSP
2.0000 | Freq: Every day | NASAL | 6 refills | Status: DC
Start: 2020-01-27 — End: 2020-06-12

## 2020-01-27 MED ORDER — TRAZODONE HCL 50 MG PO TABS
25.0000 mg | ORAL_TABLET | Freq: Every evening | ORAL | 5 refills | Status: DC | PRN
Start: 2020-01-27 — End: 2020-06-12

## 2020-01-27 MED ORDER — BUDESONIDE-FORMOTEROL FUMARATE 160-4.5 MCG/ACT IN AERO: 2.0000 | INHALATION_SPRAY | Freq: Two times a day (BID) | RESPIRATORY_TRACT | 5 refills | Status: DC

## 2020-01-27 MED ORDER — AMLODIPINE BESYLATE 10 MG PO TABS: 10.0000 mg | ORAL_TABLET | Freq: Every day | ORAL | 1 refills | Status: DC

## 2020-01-27 NOTE — Patient Instructions (Addendum)
Great to see you today.  Your bp looks great! I have called in refills for your.  Next appt in 5.5 months for physcial and hypertension- we will collected all your labs that day as well so come fasting if you can, but make sure you still take all your medications.    Let us know when you get your new insurance if Symbicort is covered.

## 2020-01-27 NOTE — Progress Notes (Signed)
Patient ID: Todd Mercado, male   DOB: [birth year], 59 y.o.   MRN: [id number]    Todd Mercado , 1961-04-01, 59 y.o., male MRN: [id number]   Patient Care Team    Relationship Specialty Notifications Start End  Natalia Leatherwood, DO PCP - General Family Medicine  07/02/15   Frederico Hamman, MD Consulting Physician Orthopedic Surgery  09/04/17      Chief Complaint  Patient presents with  . Follow-up    [facility] [place]    Subjective: Todd Mercado is a 59 y.o. male present for [facility] follow up.  Hypertension/tobacco use: Pt reports compliance with amlodipine 10 mg daily. Patient denies chest pain, shortness of breath, dizziness or lower extremity edema.   Pt does not take a daily baby ASA. Pt is not prescribed statin.  He continues to smoke daily- but he is trying to quit. Labs UTD 06/2019 Diet: No routine diet Exercise: No routine exercise RF: Hypertension, overweight, smoker, family history present  OSA/pulmonary emphysema: Patient reports he is compliance with his CPAP.   He has  been able to afford his Symbicort but it is expensive. He has a new insurance in a few weeks and may have better coverage or formulary.   Anxiety/sleep disturbance: Resolved. Changed jobs and less stress now.  He reports he is not sleeping well.  He will fall asleep but only stay asleep about 1 to 2 hours and wake every couple hours throughout the night.  He would like to try something to help him stay asleep.  He has not been on sleep aids in the past. Prior note: Patient reports he has been under more stress at work.  He is struggling with being able to continue his current job title secondary to arthritis.  He also is having other added stress at work which she does not go into great detail.  He has never been on anxiety medication in the past.  He does feel like he would benefit from a medication start.  GAD 7 : Generalized Anxiety Score 01/27/2020 12/14/2018  Nervous, Anxious, on Edge 2 1  Control/stop worrying 0 1  Worry too  much - different things 0 0  Trouble relaxing 2 2  Restless 0 0  Easily annoyed or irritable 2 1  Afraid - awful might happen 0 0  Total GAD 7 Score 6 5  Anxiety Difficulty - Somewhat difficult   Allergies  Allergen Reactions  . No Known Allergies    Social History   Tobacco Use  . Smoking status: Current Every Day Smoker    Packs/day: 1.00    Years: 30.00    Pack years: 30.00    Types: Cigarettes  . Smokeless tobacco: Current User    Types: Snuff  . Tobacco comment: discussed benefits of quitting  Substance Use Topics  . Alcohol use: Yes    Alcohol/week: 12.0 standard drinks    Types: 12 Cans of beer per week    Comment: 12 beers on weekend   Past Medical History:  Diagnosis Date  . Allergy   . Arthritis   . Cigar smoker motivated to quit   . Hypertension   . OSA (obstructive sleep apnea) 2004   CPAP recommended- to meet with home health 02/26/16  . Wears dentures    Past Surgical History:  Procedure Laterality Date  . DENTAL SURGERY    . TOTAL KNEE ARTHROPLASTY Right 03/07/2016   Procedure: TOTAL KNEE ARTHROPLASTY;  Surgeon: Frederico Hamman, MD;  Location: [facility] OR;  Service: Orthopedics;  Laterality: Right;   Family History  Problem Relation Age of Onset  . Hypertension Mother   . COPD Mother   . Arthritis Mother   . Heart disease Mother        atrial fibrillation  . Emphysema Father   . Early death Father   . Arthritis Maternal Aunt   . Diabetes Maternal Aunt   . COPD Maternal Uncle   . Heart failure Maternal Grandfather    Allergies as of 01/27/2020      Reactions   No Known Allergies       Medication List       Accurate as of January 27, 2020 10:56 AM. If you have any questions, ask your nurse or doctor.        STOP taking these medications   azithromycin 500 MG tablet Commonly known as: ZITHROMAX Stopped by: Felix Pacini, DO   PARoxetine 20 MG tablet Commonly known as: PAXIL Stopped by: Felix Pacini, DO   predniSONE 50 MG  tablet Commonly known as: DELTASONE Stopped by: Felix Pacini, DO     TAKE these medications   albuterol 108 (90 Base) MCG/ACT inhaler Commonly known as: VENTOLIN HFA Inhale 2 puffs into the lungs every 6 (six) hours as needed for wheezing or shortness of breath.   amLODipine 10 MG tablet Commonly known as: NORVASC Take 1 tablet (10 mg total) by mouth daily.   budesonide-formoterol 160-4.5 MCG/ACT inhaler Commonly known as: SYMBICORT Inhale 2 puffs into the lungs in the morning and at bedtime. What changed: See the new instructions. Changed by: Felix Pacini, DO   fluticasone 50 MCG/ACT nasal spray Commonly known as: FLONASE Place 2 sprays into both nostrils daily.   traZODone 50 MG tablet Commonly known as: DESYREL Take 0.5-1.5 tablets (25-75 mg total) by mouth at bedtime as needed for sleep. Started by: Felix Pacini, DO      ROS: Negative, with the exception of above mentioned in HPI  Objective:  BP 120/73   Pulse 71   Temp 98.8 F (37.1 C) (Oral)   Ht 6' (1.829 m)   Wt 219 lb (99.3 kg)   SpO2 96%   BMI 29.70 kg/m  Body mass index is 29.7 kg/m. Gen: Afebrile. No acute distress.  Nontoxic in appearance, well-developed, well-nourished, male. HENT: AT. [place].  No cough or shortness of breath on exam Eyes:Pupils Equal Round Reactive to light, Extraocular movements intact,  Conjunctiva without redness, discharge or icterus. CV: RRR no murmur, no edema, +2/4 P posterior tibialis pulses Chest: CTAB, no wheeze or crackles.  Mild rhonchi Skin: No rashes, purpura or petechiae.  Neuro:  Normal gait. PERLA. EOMi. Alert. Oriented x3  Psych: Normal affect, dress and demeanor. Normal speech. Normal thought content and judgment.   Assessment/Plan: Todd Mercado is a 59 y.o. male present for acute OV for  Essential hypertension, benign/obese -Stable Continue amlodipine 10 mg QD . Labs due next appointment patient encouraged to fast tried meds:  lisinopril- cough.  Low  sodium Diet, exercise Smoking cessation encouraged.  F/U 5.5 mos.   OSA (obstructive sleep apnea)/Pulmonary emphysema, unspecified emphysema type (HCC) -Stable -Continue Symbicort for now.  Refills provided today.  He will have a new insurance in a few weeks in May find Symbicort as either better covered or another type of inhaler on his formulary that is better covered.  He will call in and let us know. - Continue albuterol as needed -Compliant with CPAP  Anxiety/sleep disturbance: Anxiety has resolved since he started  his new job.  Unfortunately sleep disturbance has been an issue for him.  We discussed options on treatment for sleep disturbance today. Start trazodone 25-75 mg nightly.  Patient was instructed on proper tapering. 5.5 months follow up  Influenza vaccine administered today.  Meds ordered this encounter  Medications  . budesonide-formoterol (SYMBICORT) 160-4.5 MCG/ACT inhaler    Sig: Inhale 2 puffs into the lungs in the morning and at bedtime.    Dispense:  10.2 each    Refill:  5  . fluticasone (FLONASE) 50 MCG/ACT nasal spray    Sig: Place 2 sprays into both nostrils daily.    Dispense:  16 g    Refill:  6  . amLODipine (NORVASC) 10 MG tablet    Sig: Take 1 tablet (10 mg total) by mouth daily.    Dispense:  90 tablet    Refill:  1    DC lisinopril- med change  . albuterol (VENTOLIN HFA) 108 (90 Base) MCG/ACT inhaler    Sig: Inhale 2 puffs into the lungs every 6 (six) hours as needed for wheezing or shortness of breath.    Dispense:  6.7 g    Refill:  11  . traZODone (DESYREL) 50 MG tablet    Sig: Take 0.5-1.5 tablets (25-75 mg total) by mouth at bedtime as needed for sleep.    Dispense:  45 tablet    Refill:  5   Orders Placed This Encounter  Procedures  . Flu Vaccine QUAD 6+ mos PF IM (Fluarix Quad PF)   Referral Orders  No referral(s) requested today     electronically signed by:  Felix Pacini, DO  [facility] - OR

## 2020-02-18 ENCOUNTER — Other Ambulatory Visit: Payer: Self-pay | Admitting: Family Medicine

## 2020-06-09 ENCOUNTER — Emergency Department (HOSPITAL_BASED_OUTPATIENT_CLINIC_OR_DEPARTMENT_OTHER): Payer: 59

## 2020-06-09 ENCOUNTER — Inpatient Hospital Stay (HOSPITAL_BASED_OUTPATIENT_CLINIC_OR_DEPARTMENT_OTHER)
Admission: EM | Admit: 2020-06-09 | Discharge: 2020-06-12 | DRG: 193 | Disposition: A | Discharge status: Home / Self Care | Payer: 59 | Source: Other Acute Inpatient Hospital | Attending: Internal Medicine | Admitting: Internal Medicine

## 2020-06-09 ENCOUNTER — Encounter (HOSPITAL_BASED_OUTPATIENT_CLINIC_OR_DEPARTMENT_OTHER): Payer: Self-pay | Admitting: *Deleted

## 2020-06-09 ENCOUNTER — Other Ambulatory Visit: Payer: Self-pay

## 2020-06-09 DIAGNOSIS — J189 Pneumonia, unspecified organism: Principal | ICD-10-CM | POA: Diagnosis present

## 2020-06-09 DIAGNOSIS — I10 Essential (primary) hypertension: Secondary | ICD-10-CM

## 2020-06-09 DIAGNOSIS — F17290 Nicotine dependence, other tobacco product, uncomplicated: Secondary | ICD-10-CM | POA: Diagnosis present

## 2020-06-09 DIAGNOSIS — Z6832 Body mass index (BMI) 32.0-32.9, adult: Secondary | ICD-10-CM | POA: Diagnosis not present

## 2020-06-09 DIAGNOSIS — E669 Obesity, unspecified: Secondary | ICD-10-CM | POA: Diagnosis present

## 2020-06-09 DIAGNOSIS — E875 Hyperkalemia: Secondary | ICD-10-CM | POA: Diagnosis present

## 2020-06-09 DIAGNOSIS — F17210 Nicotine dependence, cigarettes, uncomplicated: Secondary | ICD-10-CM | POA: Diagnosis present

## 2020-06-09 DIAGNOSIS — R0902 Hypoxemia: Secondary | ICD-10-CM

## 2020-06-09 DIAGNOSIS — J441 Chronic obstructive pulmonary disease with (acute) exacerbation: Secondary | ICD-10-CM | POA: Diagnosis present

## 2020-06-09 DIAGNOSIS — I444 Left anterior fascicular block: Secondary | ICD-10-CM | POA: Diagnosis present

## 2020-06-09 DIAGNOSIS — Z8261 Family history of arthritis: Secondary | ICD-10-CM

## 2020-06-09 DIAGNOSIS — G4733 Obstructive sleep apnea (adult) (pediatric): Secondary | ICD-10-CM | POA: Diagnosis present

## 2020-06-09 DIAGNOSIS — Z20822 Contact with and (suspected) exposure to covid-19: Secondary | ICD-10-CM | POA: Diagnosis present

## 2020-06-09 DIAGNOSIS — Z825 Family history of asthma and other chronic lower respiratory diseases: Secondary | ICD-10-CM | POA: Diagnosis not present

## 2020-06-09 DIAGNOSIS — Z833 Family history of diabetes mellitus: Secondary | ICD-10-CM

## 2020-06-09 DIAGNOSIS — Z7951 Long term (current) use of inhaled steroids: Secondary | ICD-10-CM

## 2020-06-09 DIAGNOSIS — Z8249 Family history of ischemic heart disease and other diseases of the circulatory system: Secondary | ICD-10-CM

## 2020-06-09 DIAGNOSIS — R509 Fever, unspecified: Secondary | ICD-10-CM

## 2020-06-09 DIAGNOSIS — Z96651 Presence of right artificial knee joint: Secondary | ICD-10-CM | POA: Diagnosis present

## 2020-06-09 DIAGNOSIS — J440 Chronic obstructive pulmonary disease with acute lower respiratory infection: Secondary | ICD-10-CM | POA: Diagnosis present

## 2020-06-09 DIAGNOSIS — Z79899 Other long term (current) drug therapy: Secondary | ICD-10-CM | POA: Diagnosis not present

## 2020-06-09 DIAGNOSIS — Z66 Do not resuscitate: Secondary | ICD-10-CM | POA: Diagnosis present

## 2020-06-09 DIAGNOSIS — R0602 Shortness of breath: Secondary | ICD-10-CM | POA: Diagnosis not present

## 2020-06-09 DIAGNOSIS — J9601 Acute respiratory failure with hypoxia: Secondary | ICD-10-CM | POA: Diagnosis not present

## 2020-06-09 DIAGNOSIS — J439 Emphysema, unspecified: Secondary | ICD-10-CM

## 2020-06-09 HISTORY — DX: Chronic obstructive pulmonary disease, unspecified: J44.9

## 2020-06-09 HISTORY — DX: Hypoxemia: R09.02

## 2020-06-09 LAB — BASIC METABOLIC PANEL
Anion gap: 12 (ref 5–15)
BUN: 18 mg/dL (ref 6–20)
CO2: 28 mmol/L (ref 22–32)
Calcium: 8.7 mg/dL — ABNORMAL LOW (ref 8.9–10.3)
Chloride: 93 mmol/L — ABNORMAL LOW (ref 98–111)
Creatinine, Ser: 1.22 mg/dL (ref 0.61–1.24)
GFR, Estimated: >60 mL/min (ref >60)
Glucose, Bld: 109 mg/dL — ABNORMAL HIGH (ref 70–99)
Potassium: 4.5 mmol/L (ref 3.5–5.1)
Sodium: 133 mmol/L — ABNORMAL LOW (ref 135–145)

## 2020-06-09 LAB — TRIGLYCERIDES: Triglycerides: 75 mg/dL (ref <150)

## 2020-06-09 LAB — CBC
HCT: 48.6 % (ref 39.0–52.0)
Hemoglobin: 16 g/dL (ref 13.0–17.0)
MCH: 30.2 pg (ref 26.0–34.0)
MCHC: 32.9 g/dL (ref 30.0–36.0)
MCV: 91.7 fL (ref 80.0–100.0)
Platelets: 155 10*3/uL (ref 150–400)
RBC: 5.3 MIL/uL (ref 4.22–5.81)
RDW: 14.5 % (ref 11.5–15.5)
WBC: 8.4 10*3/uL (ref 4.0–10.5)
nRBC: 0 % (ref 0.0–0.2)

## 2020-06-09 LAB — SARS CORONAVIRUS 2 BY RT PCR (HOSPITAL ORDER, PERFORMED IN ~~LOC~~ HOSPITAL LAB): SARS Coronavirus 2: NEGATIVE

## 2020-06-09 LAB — FIBRINOGEN: Fibrinogen: 627 mg/dL — ABNORMAL HIGH (ref 210–475)

## 2020-06-09 LAB — D-DIMER, QUANTITATIVE: D-Dimer, Quant: 1.07 ug/mL-FEU — ABNORMAL HIGH (ref 0.00–0.50)

## 2020-06-09 LAB — RAPID INFLUENZA A&B ANTIGENS
Influenza A (ARMC): NEGATIVE
Influenza B (ARMC): NEGATIVE

## 2020-06-09 LAB — TROPONIN I (HIGH SENSITIVITY): Troponin I (High Sensitivity): 7 ng/L (ref <18)

## 2020-06-09 LAB — C-REACTIVE PROTEIN: CRP: 8.2 mg/dL — ABNORMAL HIGH (ref <1.0)

## 2020-06-09 LAB — PROCALCITONIN: Procalcitonin: <0.1 ng/mL

## 2020-06-09 LAB — LACTATE DEHYDROGENASE: LDH: 195 U/L — ABNORMAL HIGH (ref 98–192)

## 2020-06-09 LAB — FERRITIN: Ferritin: 240 ng/mL (ref 24–336)

## 2020-06-09 MED ORDER — AZITHROMYCIN 250 MG PO TABS
500.0000 mg | ORAL_TABLET | Freq: Every day | ORAL | Status: DC
  Administered 2020-06-10 – 2020-06-12 (×3): 500 mg via ORAL
  Filled 2020-06-09 (×3): qty 2

## 2020-06-09 MED ORDER — AMLODIPINE BESYLATE 10 MG PO TABS
10.0000 mg | ORAL_TABLET | Freq: Every day | ORAL | Status: DC
Start: 2020-06-10 — End: 2020-06-12
  Administered 2020-06-10 – 2020-06-12 (×3): 10 mg via ORAL
  Filled 2020-06-09 (×3): qty 1

## 2020-06-09 MED ORDER — SODIUM CHLORIDE 0.9 % IV SOLN
2.0000 g | INTRAVENOUS | Status: DC
  Administered 2020-06-10: 2 g via INTRAVENOUS
  Filled 2020-06-09: qty 2
  Filled 2020-06-09 (×2): qty 20

## 2020-06-09 MED ORDER — IPRATROPIUM-ALBUTEROL 0.5-2.5 (3) MG/3ML IN SOLN
3.0000 mL | Freq: Once | RESPIRATORY_TRACT | Status: AC
  Administered 2020-06-09: 3 mL via RESPIRATORY_TRACT
  Filled 2020-06-09: qty 3

## 2020-06-09 MED ORDER — PREDNISONE 20 MG PO TABS
40.0000 mg | ORAL_TABLET | Freq: Every day | ORAL | Status: DC
  Administered 2020-06-10 – 2020-06-12 (×3): 40 mg via ORAL
  Filled 2020-06-09 (×3): qty 2

## 2020-06-09 MED ORDER — SODIUM CHLORIDE 0.9 % IV SOLN
500.0000 mg | Freq: Once | INTRAVENOUS | Status: AC
  Administered 2020-06-09: 500 mg via INTRAVENOUS
  Filled 2020-06-09: qty 500

## 2020-06-09 MED ORDER — SODIUM CHLORIDE 0.9 % IV SOLN: INTRAVENOUS | Status: DC | PRN

## 2020-06-09 MED ORDER — FLUTICASONE FUROATE-VILANTEROL 200-25 MCG/INH IN AEPB
1.0000 | INHALATION_SPRAY | Freq: Every day | RESPIRATORY_TRACT | Status: DC
  Administered 2020-06-10 – 2020-06-12 (×3): 1 via RESPIRATORY_TRACT
  Filled 2020-06-09: qty 28

## 2020-06-09 MED ORDER — METHYLPREDNISOLONE SODIUM SUCC 125 MG IJ SOLR
125.0000 mg | Freq: Once | INTRAMUSCULAR | Status: AC
  Administered 2020-06-09: 125 mg via INTRAVENOUS
  Filled 2020-06-09: qty 2

## 2020-06-09 MED ORDER — ALBUTEROL SULFATE HFA 108 (90 BASE) MCG/ACT IN AERS
4.0000 | INHALATION_SPRAY | Freq: Once | RESPIRATORY_TRACT | Status: AC
  Administered 2020-06-09: 4 via RESPIRATORY_TRACT
  Filled 2020-06-09: qty 6.7

## 2020-06-09 MED ORDER — SODIUM CHLORIDE 0.9 % IV SOLN
1.0000 g | Freq: Once | INTRAVENOUS | Status: AC
  Administered 2020-06-09: 1 g via INTRAVENOUS
  Filled 2020-06-09: qty 10

## 2020-06-09 MED ORDER — ENOXAPARIN SODIUM 40 MG/0.4ML ~~LOC~~ SOLN
40.0000 mg | SUBCUTANEOUS | Status: DC
  Administered 2020-06-10 – 2020-06-12 (×3): 40 mg via SUBCUTANEOUS
  Filled 2020-06-09 (×3): qty 0.4

## 2020-06-09 MED ORDER — ACETAMINOPHEN 325 MG PO TABS
650.0000 mg | ORAL_TABLET | Freq: Once | ORAL | Status: AC | PRN
  Administered 2020-06-09: 650 mg via ORAL
  Filled 2020-06-09: qty 2

## 2020-06-09 NOTE — H&P (Signed)
[facility] History and Physical  Omaree Fuqua HYI:[id number] DOB: 1960/10/01 DOA: 06/09/2020  Referring physician: Dr. Stevie Kern PCP: Natalia Leatherwood, DO   Chief Complaint: shortness of breath  HPI: Todd Mercado is a 60 y.o. male with history of COPD (not on home O2), hypertension, OSA, who presents with several days of shortness of breath.  On my history patient reports that he has felt short of breath for the past 2 days.  He has COPD and is not on oxygen at home, he normally produces sputum but has noticed an increase in the quantity of sputum but not the quality.  Other than that he denies any other symptoms including chest pain, lower extremity edema, history of blood clots, immobilization.  He has been taking his inhalers without relief.  Patient initially presented to the [facility] ED where his vital signs were notable for temperature of 101.8 Fahrenheit and O2 sat of 96% on 3 L nasal cannula.  Labs showed unremarkable CBC and CMP, CRP mildly elevated at 8.2, fibrinogen 627, D-dimer was 1.07, and both Covid and influenza test were negative.  Blood cultures were obtained.  Chest x-ray showed subtle opacities in the right middle and lower lobes concerning for pneumonia.  EKG was unremarkable.  He was admitted for further management of CAP.  Review of Systems:  Pertinent positives and negative per HPI, all others reviewed and negative  Past Medical History:  Diagnosis Date  . Allergy   . Arthritis   . Cigar smoker motivated to quit   . COPD (chronic obstructive pulmonary disease) (HCC)   . Hypertension   . OSA (obstructive sleep apnea) 2004   CPAP recommended- to meet with home health 02/26/16  . Wears dentures    Past Surgical History:  Procedure Laterality Date  . DENTAL SURGERY    . TOTAL KNEE ARTHROPLASTY Right 03/07/2016   Procedure: TOTAL KNEE ARTHROPLASTY;  Surgeon: Frederico Hamman, MD;  Location: [facility] OR;  Service: Orthopedics;  Laterality: Right;    Social History:  reports that he has been smoking cigarettes. He has a 30.00 pack-year smoking history. His smokeless tobacco use includes snuff. He reports current alcohol use of about 12.0 standard drinks of alcohol per week. He reports that he does not use drugs.  No Known Allergies  Family History  Problem Relation Age of Onset  . Hypertension Mother   . COPD Mother   . Arthritis Mother   . Heart disease Mother        atrial fibrillation  . Emphysema Father   . Early death Father   . Arthritis Maternal Aunt   . Diabetes Maternal Aunt   . COPD Maternal Uncle   . Heart failure Maternal Grandfather      Prior to Admission medications   Medication Sig Start Date End Date Taking? Authorizing Provider  acetaminophen (TYLENOL) 500 MG tablet Take 1,000 mg by mouth every 6 (six) hours as needed for fever or headache (pain).   Yes [provider]  albuterol (VENTOLIN HFA) 108 (90 Base) MCG/ACT inhaler Inhale 2 puffs into the lungs every 6 (six) hours as needed for wheezing or shortness of breath. 01/27/20  Yes Kuneff, Renee A, DO  amLODipine (NORVASC) 10 MG tablet Take 1 tablet (10 mg total) by mouth daily. 01/27/20  Yes Kuneff, Renee A, DO  budesonide-formoterol (SYMBICORT) 160-4.5 MCG/ACT inhaler Inhale 2 puffs into the lungs in the morning and at bedtime. 01/27/20  Yes Kuneff, Renee A, DO  naproxen sodium (ALEVE)  220 MG tablet Take 220 mg by mouth 2 (two) times daily as needed (pain/headache).   Yes [provider]  fluticasone (FLONASE) 50 MCG/ACT nasal spray Place 2 sprays into both nostrils daily. Patient not taking: Reported on 06/09/2020 01/27/20   Felix Pacini A, DO  traZODone (DESYREL) 50 MG tablet Take 0.5-1.5 tablets (25-75 mg total) by mouth at bedtime as needed for sleep. Patient not taking: Reported on 06/09/2020 01/27/20   Natalia Leatherwood, DO   Physical Exam: Vitals:   06/09/20 1518 06/09/20 1729 06/09/20 1730 06/09/20 1953  BP:   95/60 104/65  Pulse:   61  (!) 51  Resp:   12 19  Temp:  97.6 F (36.4 C)  97.9 F (36.6 C)  TempSrc:  Oral  Oral  SpO2: 93%  94% 96%  Weight:      Height:        Wt Readings from Last 3 Encounters:  06/09/20 107.5 kg  01/27/20 99.3 kg  06/10/19 105.7 kg     . General:  Appears calm and comfortable . Eyes: PERRL, normal lids, irises & conjunctiva . ENT: grossly normal hearing, lips & tongue . Neck: no masses or thyromegaly . Cardiovascular: RRR, no m/r/g. No LE edema. . Telemetry: SR, no arrhythmias  . Respiratory: Moderately reduced breath sounds throughout, diffuse faint end expiratory wheezes. Normal respiratory effort. . Abdomen: soft, ntnd . Skin: no rash or induration seen on limited exam . Musculoskeletal: grossly normal tone BUE/BLE . Psychiatric: grossly normal mood and affect, speech fluent and appropriate . Neurologic: grossly non-focal.          Labs on Admission:  Basic Metabolic Panel: Recent Labs  Lab 06/09/20 1209  NA 133*  K 4.5  CL 93*  CO2 28  GLUCOSE 109*  BUN 18  CREATININE 1.22  CALCIUM 8.7*   Liver Function Tests: No results for input(s): AST, ALT, ALKPHOS, BILITOT, PROT, ALBUMIN in the last 168 hours. No results for input(s): LIPASE, AMYLASE in the last 168 hours. No results for input(s): AMMONIA in the last 168 hours. CBC: Recent Labs  Lab 06/09/20 1209  WBC 8.4  HGB 16.0  HCT 48.6  MCV 91.7  PLT 155   Cardiac Enzymes: No results for input(s): CKTOTAL, CKMB, CKMBINDEX, TROPONINI in the last 168 hours.  BNP (last 3 results) No results for input(s): BNP in the last 8760 hours.  ProBNP (last 3 results) No results for input(s): PROBNP in the last 8760 hours.  CBG: No results for input(s): GLUCAP in the last 168 hours.  Radiological Exams on Admission: DG Chest Port 1 View  Result Date: 06/09/2020 CLINICAL DATA:  Cough.  Shortness of breath.  Fever. EXAM: PORTABLE CHEST 1 VIEW COMPARISON:  Two-view chest x-ray 06/06/2015 FINDINGS: The heart size is  normal. Atherosclerotic changes are noted at the aortic arch. Subtle opacity in the right middle and lower lobe is concerning for early infection. IMPRESSION: Subtle opacity in the right middle and lower lobe is concerning for early infection. Electronically Signed   By: Marin Roberts M.D.   On: 06/09/2020 12:42    EKG: Independently reviewed.  My read: Sinus rhythm, anterior fascicular block and possible septal infarct with Q waves in V2 although not definitive, both of these findings are new compared to prior in 2017.  Assessment/Plan Active Problems:   Hypoxia   #Acute hypoxemic respiratory failure #Community acquired pneumonia #COPD exacerbation Patient presenting with findings of hypoxemia and chest x-ray showing opacities.  Influenza and  Covid testing are negative.  Lung exam also concerning for COPD exacerbation. -Continue azithromycin and ceftriaxone for CAP treatment -Prednisone 40 mg daily -Nebs as needed -Wean O2 as tolerated  #Known medical problems HTN - cont amlodipine  Code Status: DNR, confirmed with patient DVT Prophylaxis: lovenox Family Communication: none per patient request Disposition Plan: Inpatient Med-Surg   Time spent: 50 min  Venora Maples MD/[facility]

## 2020-06-09 NOTE — ED Provider Notes (Signed)
[facility] EMERGENCY DEPARTMENT Provider Note   CSN: [id number] Arrival date & time: 06/09/20  1201     History Chief Complaint  Patient presents with  . Shortness of Breath    Todd Mercado is a 60 y.o. male.  Presents to ER with concern for shortness of breath.  Patient reports that over the last few days he has had shortness of breath as well as cough.  Cough is productive, yellow sputum in the past couple days.  Does not wear oxygen at home, does have history of COPD and sleep apnea.  States that in the evenings has been using his inhaler and it seems to be helping.  Checks his oxygen daily, last night oxygen seem to be mildly low in the low 90s.  Today breathing was worse so he came to ER.  No chest pain.  Has had some myalgias and chills.  HPI     Past Medical History:  Diagnosis Date  . Allergy   . Arthritis   . Cigar smoker motivated to quit   . COPD (chronic obstructive pulmonary disease) (HCC)   . Hypertension   . OSA (obstructive sleep apnea) 2004   CPAP recommended- to meet with home health 02/26/16  . Wears dentures     Patient Active Problem List   Diagnosis Date Noted  . Sleep disturbance 01/27/2020  . Current every day smoker 06/02/2019  . Anxiety 06/21/2018  . Pulmonary emphysema (HCC) 02/27/2016  . Morbid obesity (HCC) 01/24/2016  . OSA (obstructive sleep apnea) 01/24/2016  . 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 History of ETOH abuse 03/11/2013  . Essential hypertension, benign 02/11/2013    Past Surgical History:  Procedure Laterality Date  . DENTAL SURGERY    . TOTAL KNEE ARTHROPLASTY Right 03/07/2016   Procedure: TOTAL KNEE ARTHROPLASTY;  Surgeon: Frederico Hamman, MD;  Location: [facility] OR;  Service: Orthopedics;  Laterality: Right;       Family History  Problem Relation Age of Onset  . Hypertension Mother   . COPD Mother   . Arthritis Mother   . Heart disease Mother        atrial fibrillation  . Emphysema Father   . Early death Father   .  Arthritis Maternal Aunt   . Diabetes Maternal Aunt   . COPD Maternal Uncle   . Heart failure Maternal Grandfather     Social History   Tobacco Use  . Smoking status: Current Every Day Smoker    Packs/day: 1.00    Years: 30.00    Pack years: 30.00    Types: Cigarettes  . Smokeless tobacco: Current User    Types: Snuff  . Tobacco comment: discussed benefits of quitting  Vaping Use  . Vaping Use: Never used  Substance Use Topics  . Alcohol use: Yes    Alcohol/week: 12.0 standard drinks    Types: 12 Cans of beer per week    Comment: 12 beers on weekend  . Drug use: No    Home Medications Prior to Admission medications   Medication Sig Start Date End Date Taking? Authorizing Provider  albuterol (VENTOLIN HFA) 108 (90 Base) MCG/ACT inhaler Inhale 2 puffs into the lungs every 6 (six) hours as needed for wheezing or shortness of breath. 01/27/20   Kuneff, Renee A, DO  amLODipine (NORVASC) 10 MG tablet Take 1 tablet (10 mg total) by mouth daily. 01/27/20   Kuneff, Renee A, DO  budesonide-formoterol (SYMBICORT) 160-4.5 MCG/ACT inhaler Inhale 2 puffs into the lungs  in the morning and at bedtime. 01/27/20   Kuneff, Renee A, DO  fluticasone (FLONASE) 50 MCG/ACT nasal spray Place 2 sprays into both nostrils daily. 01/27/20   Kuneff, Renee A, DO  traZODone (DESYREL) 50 MG tablet Take 0.5-1.5 tablets (25-75 mg total) by mouth at bedtime as needed for sleep. 01/27/20   Kuneff, Renee A, DO    Allergies    No known allergies  Review of Systems   Review of Systems  Constitutional: Positive for chills and fever.  HENT: Negative for ear pain and sore throat.   Eyes: Negative for pain and visual disturbance.  Respiratory: Positive for cough and shortness of breath.   Cardiovascular: Negative for chest pain and palpitations.  Gastrointestinal: Negative for abdominal pain and vomiting.  Genitourinary: Negative for dysuria and hematuria.  Musculoskeletal: Negative for arthralgias and back pain.   Skin: Negative for color change and rash.  Neurological: Negative for seizures and syncope.  All other systems reviewed and are negative.   Physical Exam Updated Vital Signs BP 121/65   Pulse 81   Temp (!) 101.8 F (38.8 C) (Oral)   Resp 15   Ht 6' (1.829 m)   Wt 107.5 kg   SpO2 90%   BMI 32.14 kg/m   Physical Exam Vitals and nursing note reviewed.  Constitutional:      Appearance: He is well-developed and well-nourished.  HENT:     Head: Normocephalic and atraumatic.  Eyes:     Conjunctiva/sclera: Conjunctivae normal.  Cardiovascular:     Rate and Rhythm: Normal rate and regular rhythm.     Heart sounds: No murmur heard.   Pulmonary:     Effort: No respiratory distress.     Comments: Expiratory wheeze bilaterally, mild tachypnea but no distress Abdominal:     Palpations: Abdomen is soft.     Tenderness: There is no abdominal tenderness.  Musculoskeletal:        General: No edema.     Cervical back: Neck supple.  Skin:    General: Skin is warm and dry.  Neurological:     General: No focal deficit present.     Mental Status: He is alert.  Psychiatric:        Mood and Affect: Mood and affect and mood normal.     ED Results / Procedures / Treatments   Labs (all labs ordered are listed, but only abnormal results are displayed) Labs Reviewed  BASIC METABOLIC PANEL - Abnormal; Notable for the following components:      Result Value   Sodium 133 (*)    Chloride 93 (*)    Glucose, Bld 109 (*)    Calcium 8.7 (*)    All other components within normal limits  D-DIMER, QUANTITATIVE (NOT AT [facility]) - Abnormal; Notable for the following components:   D-Dimer, Quant 1.07 (*)    All other components within normal limits  SARS CORONAVIRUS 2 BY RT PCR (HOSPITAL ORDER, PERFORMED IN [facility])  CULTURE, BLOOD (ROUTINE X 2)  CULTURE, BLOOD (ROUTINE X 2)  RAPID INFLUENZA A&B ANTIGENS  CBC  LACTIC ACID, PLASMA  LACTIC ACID, PLASMA  PROCALCITONIN   LACTATE DEHYDROGENASE  FERRITIN  TRIGLYCERIDES  FIBRINOGEN  C-REACTIVE PROTEIN  TROPONIN I (HIGH SENSITIVITY)    EKG EKG Interpretation  Date/Time:  Saturday June 09 2020 12:13:55 EST Ventricular Rate:  76 PR Interval:    QRS Duration: 100 QT Interval:  351 QTC Calculation: 395 R Axis:   -116 Text Interpretation:  Sinus rhythm Left anterior fascicular block Probable anteroseptal infarct, old Confirmed by Marianna Fuss ([id number]) on 06/09/2020 12:29:59 PM   Radiology DG Chest Port 1 View  Result Date: 06/09/2020 CLINICAL DATA:  Cough.  Shortness of breath.  Fever. EXAM: PORTABLE CHEST 1 VIEW COMPARISON:  Two-view chest x-ray 06/06/2015 FINDINGS: The heart size is normal. Atherosclerotic changes are noted at the aortic arch. Subtle opacity in the right middle and lower lobe is concerning for early infection. IMPRESSION: Subtle opacity in the right middle and lower lobe is concerning for early infection. Electronically Signed   By: Marin Roberts M.D.   On: 06/09/2020 12:42    Procedures .Critical Care Performed by: Milagros Loll, MD Authorized by: Milagros Loll, MD   Critical care provider statement:    Critical care time (minutes):  40   Critical care was time spent personally by me on the following activities:  Discussions with consultants, evaluation of patient's response to treatment, examination of patient, ordering and performing treatments and interventions, ordering and review of laboratory studies, ordering and review of radiographic studies, pulse oximetry, re-evaluation of patient's condition, obtaining history from patient or surrogate and review of old charts     Medications Ordered in ED Medications  cefTRIAXone (ROCEPHIN) 1 g in sodium chloride 0.9 % 100 mL IVPB (has no administration in time range)  azithromycin (ZITHROMAX) 500 mg in sodium chloride 0.9 % 250 mL IVPB (has no administration in time range)  ipratropium-albuterol (DUONEB) 0.5-2.5  (3) MG/3ML nebulizer solution 3 mL (has no administration in time range)  acetaminophen (TYLENOL) tablet 650 mg (650 mg Oral Given 06/09/20 1220)  methylPREDNISolone sodium succinate (SOLU-MEDROL) 125 mg/2 mL injection 125 mg (125 mg Intravenous Given 06/09/20 1323)  albuterol (VENTOLIN HFA) 108 (90 Base) MCG/ACT inhaler 4 puff (4 puffs Inhalation Given 06/09/20 1322)    ED Course  I have reviewed the triage vital signs and the nursing notes.  Pertinent labs & imaging results that were available during my care of the patient were reviewed by me and considered in my medical decision making (see chart for details).    MDM Rules/Calculators/A&P                         60 year old male presents to ER with concern for shortness of breath.  On exam patient noted to have expiratory wheeze, mild tachypnea but no distress, also noted to be febrile.  CXR with possible infiltrate in right middle and lower lobe.  Covid negative.  Suspect pneumonia causing COPD exacerbation.  Provided patient with breathing treatment, antibiotics, steroids.  Will admit to the hospitalist for further management given his hypoxia.  Final Clinical Impression(s) / ED Diagnoses Final diagnoses:  Community acquired pneumonia of right lung, unspecified part of lung  Fever, unspecified fever cause  COPD exacerbation (HCC)    Rx / DC Orders ED Discharge Orders    None       Milagros Loll, MD 06/09/20 1421

## 2020-06-09 NOTE — Progress Notes (Signed)
  Todd Mercado is a 60 year old male with history of COPD (not O2 dependent) continues tobacco abuse, hypertension, OSA, arthritis presented [facility] shortness of breath, productive cough, fever. He reported that his symptoms started few days ago and progressively has gotten worse. Reports that he has been checking his oxygen, and been in low 90s.. Reports that he has been vaccinated on 09/04/2019 influenza, Covid Pfizer with booser   ED: Temp 101.8, pulse 81, RR 15, BP 121/65, currently on 3 L of oxygen satting 90%. SARS-CoV-2 negative CMP within normal limits, CBC with an order for WBC of 8.4 Chest x-ray: Subtle opacity in the right middle, lower lobe concerning for early infection Pending lactic acid  Blood cultures x2 has been obtained Patient has been treated with DuoNeb, Solu-Medrol, azithromycin and Rocephin..    Request for patient to be admitted for pneumonia, COPD exacerbation  Requesting [facility].   SIGNED: Kendell Bane, MD, FHM. [facility],  Pager (please use [email or web address] to page/text)  Please use Epic Secure Chat for non-urgent communication (7AM-7PM) If 7PM-7AM, please contact night-coverage [email or web address],  06/09/2020, 3:07 PM

## 2020-06-09 NOTE — ED Triage Notes (Signed)
Per EMS: pt from [facility]. Reports that pt was seen this morning for SOB.  On EMS arrival to [facility] pt was 82%RA. Pt tachypnea. Pt reports productive cough of yellow sputum x 3 days. Pt on 3L [place], 97%. Pt denies pain. Negative covid test at home, negative covid test at [facility] today.

## 2020-06-09 NOTE — ED Notes (Signed)
Pt on cardiac monitor, continuous pulse ox and auto VS 

## 2020-06-10 DIAGNOSIS — J9601 Acute respiratory failure with hypoxia: Secondary | ICD-10-CM

## 2020-06-10 LAB — COMPREHENSIVE METABOLIC PANEL
ALT: 19 U/L (ref 0–44)
AST: 20 U/L (ref 15–41)
Albumin: 3.5 g/dL (ref 3.5–5.0)
Alkaline Phosphatase: 52 U/L (ref 38–126)
Anion gap: 11 (ref 5–15)
BUN: 25 mg/dL — ABNORMAL HIGH (ref 6–20)
CO2: 28 mmol/L (ref 22–32)
Calcium: 8.6 mg/dL — ABNORMAL LOW (ref 8.9–10.3)
Chloride: 99 mmol/L (ref 98–111)
Creatinine, Ser: 0.77 mg/dL (ref 0.61–1.24)
GFR, Estimated: >60 mL/min (ref >60)
Glucose, Bld: 136 mg/dL — ABNORMAL HIGH (ref 70–99)
Potassium: 4.5 mmol/L (ref 3.5–5.1)
Sodium: 138 mmol/L (ref 135–145)
Total Bilirubin: 0.6 mg/dL (ref 0.3–1.2)
Total Protein: 7.2 g/dL (ref 6.5–8.1)

## 2020-06-10 LAB — CBC
HCT: 42 % (ref 39.0–52.0)
Hemoglobin: 13.9 g/dL (ref 13.0–17.0)
MCH: 30 pg (ref 26.0–34.0)
MCHC: 33.1 g/dL (ref 30.0–36.0)
MCV: 90.5 fL (ref 80.0–100.0)
Platelets: 155 10*3/uL (ref 150–400)
RBC: 4.64 MIL/uL (ref 4.22–5.81)
RDW: 14.2 % (ref 11.5–15.5)
WBC: 8.1 10*3/uL (ref 4.0–10.5)
nRBC: 0 % (ref 0.0–0.2)

## 2020-06-10 LAB — STREP PNEUMONIAE URINARY ANTIGEN: Strep Pneumo Urinary Antigen: NEGATIVE

## 2020-06-10 MED ORDER — IPRATROPIUM-ALBUTEROL 0.5-2.5 (3) MG/3ML IN SOLN
3.0000 mL | Freq: Four times a day (QID) | RESPIRATORY_TRACT | Status: DC
  Administered 2020-06-10 – 2020-06-11 (×3): 3 mL via RESPIRATORY_TRACT
  Filled 2020-06-10 (×3): qty 3

## 2020-06-10 NOTE — Progress Notes (Signed)
[facility] PROGRESS NOTE   Todd Mercado RCV:[id number] DOB: [birth year] DOA: 06/09/2020  PCP: Natalia Leatherwood, DO  Brief History/Interval Summary: 60 y.o. male with history of COPD (not on home O2), hypertension, OSA, who presents with several days of shortness of breath.  Patient was found to have pneumonia on x-ray.  There was also concern for exacerbation of his COPD.  He had new oxygen requirement.  He was hospitalized for further management.  Reason for Visit: Community-acquired pneumonia.  Acute COPD exacerbation.  Consultants: None  Procedures: None  Antibiotics: Anti-infectives (From admission, onward)   Start     Dose/Rate Route Frequency Ordered Stop   06/10/20 1400  cefTRIAXone (ROCEPHIN) 2 g in sodium chloride 0.9 % 100 mL IVPB        2 g 200 mL/hr over 30 Minutes Intravenous Every 24 hours 06/09/20 2252 06/14/20 1359   06/10/20 1200  azithromycin (ZITHROMAX) tablet 500 mg        500 mg Oral Daily 06/09/20 2252 06/14/20 0959   06/09/20 1415  cefTRIAXone (ROCEPHIN) 1 g in sodium chloride 0.9 % 100 mL IVPB        1 g 200 mL/hr over 30 Minutes Intravenous  Once 06/09/20 1412 06/09/20 1536   06/09/20 1415  azithromycin (ZITHROMAX) 500 mg in sodium chloride 0.9 % 250 mL IVPB        500 mg 250 mL/hr over 60 Minutes Intravenous  Once 06/09/20 1412 06/09/20 1729      Subjective/Interval History: Patient mentions that he is feeling slightly better today compared to yesterday.  Continues to have some wheezing.  Occasional dry cough.  No nausea vomiting.  Denies any chest pain.     Assessment/Plan:  Acute respiratory failure with hypoxia/community-acquired pneumonia/acute COPD exacerbation Patient tested negative for influenza and COVID-19.  Chest x-ray showed right-sided opacities.  Patient currently requiring 2 to 3 L of oxygen by nasal cannula.  He has wheezing bilaterally.  He is noted to be on ceftriaxone and azithromycin.  Also on prednisone.  He is also on  inhaled steroids.  Wait for further improvement.  Wean down oxygen.  Mobilize.  Essential hypertension Continue amlodipine.  Obesity Estimated body mass index is 32.14 kg/m as calculated from the following:   Height as of this encounter: 6' (1.829 m).   Weight as of this encounter: 107.5 kg.   DVT Prophylaxis: Lovenox Code Status: DNR Family Communication: Spoke with patient.  No family at bedside. Disposition Plan: Hopefully return home when improved  Status is: Inpatient  Remains inpatient appropriate because:IV treatments appropriate due to intensity of illness or inability to take PO and Inpatient level of care appropriate due to severity of illness   Dispo: The patient is from: Home              Anticipated d/c is to: Home              Anticipated d/c date is: 2 days              Patient currently is not medically stable to d/c.   Difficult to place patient No     Medications:  Scheduled: . amLODipine  10 mg Oral Daily  . azithromycin  500 mg Oral Daily  . enoxaparin (LOVENOX) injection  40 mg Subcutaneous Q24H  . fluticasone furoate-vilanterol  1 puff Inhalation Daily  . predniSONE  40 mg Oral Q breakfast   Continuous: . cefTRIAXone (ROCEPHIN)  IV     PRN:  Objective:  Vital Signs  Vitals:   06/09/20 1953 06/10/20 0010 06/10/20 0416 06/10/20 0900  BP: 104/65 110/65 112/75   Pulse: (!) 51 65 72   Resp: 19 18 18    Temp: 97.9 F (36.6 C) (!) 97.5 F (36.4 C) (!) 97.4 F (36.3 C)   TempSrc: Oral Oral Oral   SpO2: 96% 95% 95% 93%  Weight:      Height:        Intake/Output Summary (Last 24 hours) at 06/10/2020 1248 Last data filed at 06/10/2020 0934 Gross per 24 hour  Intake 880 ml  Output 400 ml  Net 480 ml   Filed Weights   06/09/20 1202  Weight: 107.5 kg    General appearance: Awake alert.  In no distress Resp: Mildly tachypneic.  Wheezing heard bilaterally.  Few crackles right side. Cardio: S1-S2 is normal regular.  No S3-S4.  No rubs  murmurs or bruit GI: Abdomen is soft.  Nontender nondistended.  Bowel sounds are present normal.  No masses organomegaly Extremities: No edema.  Full range of motion of lower extremities. Neurologic: Alert and oriented x3.  No focal neurological deficits.    Lab Results:  Data Reviewed: I have personally reviewed following labs and imaging studies  CBC: Recent Labs  Lab 06/09/20 1209 06/10/20 0537  WBC 8.4 8.1  HGB 16.0 13.9  HCT 48.6 42.0  MCV 91.7 90.5  PLT 155 155    Basic Metabolic Panel: Recent Labs  Lab 06/09/20 1209 06/10/20 0537  NA 133* 138  K 4.5 4.5  CL 93* 99  CO2 28 28  GLUCOSE 109* 136*  BUN 18 25*  CREATININE 1.22 0.77  CALCIUM 8.7* 8.6*    GFR: Estimated Creatinine Clearance: 129.1 mL/min (by C-G formula based on SCr of 0.77 mg/dL).  Liver Function Tests: Recent Labs  Lab 06/10/20 0537  AST 20  ALT 19  ALKPHOS 52  BILITOT 0.6  PROT 7.2  ALBUMIN 3.5     Lipid Profile: Recent Labs    06/09/20 1209  TRIG 75    Anemia Panel: Recent Labs    06/09/20 1209  FERRITIN 240    Recent Results (from the past 240 hour(s))  Blood Culture (routine x 2)     Status: None (Preliminary result)   Collection Time: 06/09/20 12:00 PM   Specimen: BLOOD  Result Value Ref Range Status   Specimen Description   Final    BLOOD RIGHT ANTECUBITAL Performed at [facility], [address] [place], [place]    Special Requests   Final    BOTTLES DRAWN AEROBIC AND ANAEROBIC Blood Culture adequate volume Performed at [facility], [address] [place], [place]    Culture   Final    NO GROWTH < 24 HOURS Performed at [facility], [address] [place], [place]    Report Status PENDING  Incomplete  SARS Coronavirus 2 by RT PCR (hospital order, performed in [facility] hospital lab) Nasopharyngeal Nasopharyngeal Swab     Status: None   Collection Time: 06/09/20 12:40 PM   Specimen:  Nasopharyngeal Swab  Result Value Ref Range Status   SARS Coronavirus 2 NEGATIVE NEGATIVE Final    Comment: (NOTE) SARS-CoV-2 target nucleic acids are NOT DETECTED.  The SARS-CoV-2 RNA is generally detectable in upper and lower respiratory specimens during the acute phase of infection. The lowest concentration of SARS-CoV-2 viral copies this assay can detect is 250  copies / mL. A negative result does not preclude SARS-CoV-2 infection and should not be used as the sole basis for treatment or other patient management decisions.  A negative result may occur with improper specimen collection / handling, submission of specimen other than nasopharyngeal swab, presence of viral mutation(s) within the areas targeted by this assay, and inadequate number of viral copies (<250 copies / mL). A negative result must be combined with clinical observations, patient history, and epidemiological information.  Fact Sheet for Patients:   BoilerBrush.com.cy  Fact Sheet for Healthcare Providers: https://pope.com/  This test is not yet approved or  cleared by the [place] FDA and has been authorized for detection and/or diagnosis of SARS-CoV-2 by FDA under an Emergency Use Authorization (EUA).  This EUA will remain in effect (meaning this test can be used) for the duration of the COVID-19 declaration under Section 564(b)(1) of the Act, 21 U.S.C. section 360bbb-3(b)(1), unless the authorization is terminated or revoked sooner.  Performed at [facility], [address] [place], [place] [address]   Blood Culture (routine x 2)     Status: None (Preliminary result)   Collection Time: 06/09/20 12:45 PM   Specimen: BLOOD  Result Value Ref Range Status   Specimen Description   Final    BLOOD BLOOD LEFT WRIST Performed at [facility], [address] [place], [place] [address]    Special Requests   Final    BOTTLES DRAWN AEROBIC  AND ANAEROBIC Blood Culture adequate volume Performed at [facility], [address] [place], [place] [address]    Culture   Final    NO GROWTH < 24 HOURS Performed at [facility], [address] [place], [place] [address]    Report Status PENDING  Incomplete  Rapid Influenza A&B Antigens     Status: None   Collection Time: 06/09/20  2:20 PM   Specimen: Respiratory  Result Value Ref Range Status   Influenza A ([facility]) NEGATIVE NEGATIVE Final   Influenza B ([facility]) NEGATIVE NEGATIVE Final    Comment: Negative results do not exclude influenza virus infection, and influenza should still be considered if clinical suspicion is high. Performed at [facility], [address], [place], [place] [address]       Radiology Studies: DG Chest [place] 1 View  Result Date: 06/09/2020 CLINICAL DATA:  Cough.  Shortness of breath.  Fever. EXAM: PORTABLE CHEST 1 VIEW COMPARISON:  Two-view chest x-ray 06/06/2015 FINDINGS: The heart size is normal. Atherosclerotic changes are noted at the aortic arch. Subtle opacity in the right middle and lower lobe is concerning for early infection. IMPRESSION: Subtle opacity in the right middle and lower lobe is concerning for early infection. Electronically Signed   By: Marin Roberts M.D.   On: 06/09/2020 12:42       LOS: 1 day   Demani Weyrauch  [facility] Pager on [email or web address]  06/10/2020, 12:48 PM

## 2020-06-11 LAB — BASIC METABOLIC PANEL
Anion gap: 9 (ref 5–15)
BUN: 21 mg/dL — ABNORMAL HIGH (ref 6–20)
CO2: 32 mmol/L (ref 22–32)
Calcium: 8.4 mg/dL — ABNORMAL LOW (ref 8.9–10.3)
Chloride: 97 mmol/L — ABNORMAL LOW (ref 98–111)
Creatinine, Ser: 0.77 mg/dL (ref 0.61–1.24)
GFR, Estimated: >60 mL/min (ref >60)
Glucose, Bld: 114 mg/dL — ABNORMAL HIGH (ref 70–99)
Potassium: 5.3 mmol/L — ABNORMAL HIGH (ref 3.5–5.1)
Sodium: 138 mmol/L (ref 135–145)

## 2020-06-11 LAB — CBC
HCT: 42.2 % (ref 39.0–52.0)
Hemoglobin: 13.6 g/dL (ref 13.0–17.0)
MCH: 30.2 pg (ref 26.0–34.0)
MCHC: 32.2 g/dL (ref 30.0–36.0)
MCV: 93.6 fL (ref 80.0–100.0)
Platelets: 156 10*3/uL (ref 150–400)
RBC: 4.51 MIL/uL (ref 4.22–5.81)
RDW: 13.8 % (ref 11.5–15.5)
WBC: 12.4 10*3/uL — ABNORMAL HIGH (ref 4.0–10.5)
nRBC: 0 % (ref 0.0–0.2)

## 2020-06-11 LAB — LEGIONELLA PNEUMOPHILA SEROGP 1 UR AG: L. pneumophila Serogp 1 Ur Ag: NEGATIVE

## 2020-06-11 MED ORDER — GUAIFENESIN ER 600 MG PO TB12
600.0000 mg | ORAL_TABLET | Freq: Two times a day (BID) | ORAL | Status: DC
  Administered 2020-06-11 – 2020-06-12 (×3): 600 mg via ORAL
  Filled 2020-06-11 (×3): qty 1

## 2020-06-11 MED ORDER — IPRATROPIUM-ALBUTEROL 0.5-2.5 (3) MG/3ML IN SOLN
3.0000 mL | Freq: Two times a day (BID) | RESPIRATORY_TRACT | Status: DC
  Administered 2020-06-11 – 2020-06-12 (×2): 3 mL via RESPIRATORY_TRACT
  Filled 2020-06-11 (×2): qty 3

## 2020-06-11 MED ORDER — SODIUM ZIRCONIUM CYCLOSILICATE 10 G PO PACK
10.0000 g | PACK | Freq: Once | ORAL | Status: DC
  Filled 2020-06-11: qty 1

## 2020-06-11 MED ORDER — CEFDINIR 300 MG PO CAPS
300.0000 mg | ORAL_CAPSULE | Freq: Two times a day (BID) | ORAL | Status: DC
  Administered 2020-06-11 – 2020-06-12 (×3): 300 mg via ORAL
  Filled 2020-06-11 (×3): qty 1

## 2020-06-11 NOTE — Progress Notes (Signed)
SATURATION QUALIFICATIONS: (This note is used to comply with regulatory documentation for home oxygen)  Patient Saturations on Room Air at Rest = 94%  Patient Saturations on Room Air while Ambulating = 88%  Patient Saturations on 1 Liters of oxygen while Ambulating = 94%  Please briefly explain why patient needs home oxygen:will notify MD

## 2020-06-11 NOTE — Progress Notes (Signed)
[facility] PROGRESS NOTE   Todd Mercado HQI:[id number] DOB: 08-Jun-1960 DOA: 06/09/2020  PCP: Natalia Leatherwood, DO  Brief History/Interval Summary: 60 y.o. male with history of COPD (not on home O2), hypertension, OSA, who presents with several days of shortness of breath.  Patient was found to have pneumonia on x-ray.  There was also concern for exacerbation of his COPD.  He had new oxygen requirement.  He was hospitalized for further management.  Reason for Visit: Community-acquired pneumonia.  Acute COPD exacerbation.  Consultants: None  Procedures: None  Antibiotics: Anti-infectives (From admission, onward)   Start     Dose/Rate Route Frequency Ordered Stop   06/10/20 1400  cefTRIAXone (ROCEPHIN) 2 g in sodium chloride 0.9 % 100 mL IVPB        2 g 200 mL/hr over 30 Minutes Intravenous Every 24 hours 06/09/20 2252 06/14/20 1359   06/10/20 1200  azithromycin (ZITHROMAX) tablet 500 mg        500 mg Oral Daily 06/09/20 2252 06/14/20 0959   06/09/20 1415  cefTRIAXone (ROCEPHIN) 1 g in sodium chloride 0.9 % 100 mL IVPB        1 g 200 mL/hr over 30 Minutes Intravenous  Once 06/09/20 1412 06/09/20 1536   06/09/20 1415  azithromycin (ZITHROMAX) 500 mg in sodium chloride 0.9 % 250 mL IVPB        500 mg 250 mL/hr over 60 Minutes Intravenous  Once 06/09/20 1412 06/09/20 1729      Subjective/Interval History: Patient mentions that he is feeling better.  He is however coughing up yellowish sputum.  Denies any blood in the sputum.  No chest pain.  Less wheezing.       Assessment/Plan:  Acute respiratory failure with hypoxia/community-acquired pneumonia/acute COPD exacerbation Patient tested negative for influenza and COVID-19.  Chest x-ray showed right-sided opacities.  Seems to be doing better.  Continue to wean down oxygen.  He does have purulent expectoration.  We will put him on Mucinex.  He has been afebrile.  WBC noted to be higher today which could be due to steroids.  We  will change him to oral antibiotics.  Continue nebulizer treatments and inhaled steroids.  Mobilize.  Check ambulatory pulse oximetry.    Essential hypertension Continue amlodipine.  Blood pressure remains stable.  Hyperkalemia Lokelma x1.  Not on any potassium supplements.  Could be due to steroids.  Recheck tomorrow  Obesity Estimated body mass index is 32.14 kg/m as calculated from the following:   Height as of this encounter: 6' (1.829 m).   Weight as of this encounter: 107.5 kg.   DVT Prophylaxis: Lovenox Code Status: DNR Family Communication: Spoke with patient.  No family at bedside. Disposition Plan: Hopefully return home when improved  Status is: Inpatient  Remains inpatient appropriate because:IV treatments appropriate due to intensity of illness or inability to take PO and Inpatient level of care appropriate due to severity of illness   Dispo: The patient is from: Home              Anticipated d/c is to: Home              Anticipated d/c date is: 2 days              Patient currently is not medically stable to d/c.   Difficult to place patient No     Medications:  Scheduled: . amLODipine  10 mg Oral Daily  . azithromycin  500 mg Oral Daily  .  enoxaparin (LOVENOX) injection  40 mg Subcutaneous Q24H  . fluticasone furoate-vilanterol  1 puff Inhalation Daily  . ipratropium-albuterol  3 mL Nebulization BID  . predniSONE  40 mg Oral Q breakfast  . sodium zirconium cyclosilicate  10 g Oral Once   Continuous: . cefTRIAXone (ROCEPHIN)  IV 2 g (06/10/20 1952)   PRN:   Objective:  Vital Signs  Vitals:   06/11/20 0452 06/11/20 0753 06/11/20 1012 06/11/20 1016  BP: 123/86     Pulse: (!) 50  (!) 56 (!) 50  Resp: 18   15  Temp: (!) 97.5 F (36.4 C)     TempSrc: Oral     SpO2: 97% 97% 93% 93%  Weight:      Height:        Intake/Output Summary (Last 24 hours) at 06/11/2020 1129 Last data filed at 06/11/2020 0930 Gross per 24 hour  Intake 1540 ml  Output  2125 ml  Net -585 ml   Filed Weights   06/09/20 1202  Weight: 107.5 kg    General appearance: Awake alert.  In no distress Resp: Improved aeration.  Normal effort at rest.  Less wheezing today compared to yesterday.  Few crackles right base. Cardio: S1-S2 is normal regular.  No S3-S4.  No rubs murmurs or bruit GI: Abdomen is soft.  Nontender nondistended.  Bowel sounds are present normal.  No masses organomegaly Extremities: No edema.  Full range of motion of lower extremities. Neurologic: Alert and oriented x3.  No focal neurological deficits.      Lab Results:  Data Reviewed: I have personally reviewed following labs and imaging studies  CBC: Recent Labs  Lab 06/09/20 1209 06/10/20 0537 06/11/20 0431  WBC 8.4 8.1 12.4*  HGB 16.0 13.9 13.6  HCT 48.6 42.0 42.2  MCV 91.7 90.5 93.6  PLT 155 155 156    Basic Metabolic Panel: Recent Labs  Lab 06/09/20 1209 06/10/20 0537 06/11/20 0431  NA 133* 138 138  K 4.5 4.5 5.3*  CL 93* 99 97*  CO2 28 28 32  GLUCOSE 109* 136* 114*  BUN 18 25* 21*  CREATININE 1.22 0.77 0.77  CALCIUM 8.7* 8.6* 8.4*    GFR: Estimated Creatinine Clearance: 129.1 mL/min (by C-G formula based on SCr of 0.77 mg/dL).  Liver Function Tests: Recent Labs  Lab 06/10/20 0537  AST 20  ALT 19  ALKPHOS 52  BILITOT 0.6  PROT 7.2  ALBUMIN 3.5     Lipid Profile: Recent Labs    06/09/20 1209  TRIG 75    Anemia Panel: Recent Labs    06/09/20 1209  FERRITIN 240    Recent Results (from the past 240 hour(s))  Blood Culture (routine x 2)     Status: None (Preliminary result)   Collection Time: 06/09/20 12:00 PM   Specimen: BLOOD  Result Value Ref Range Status   Specimen Description   Final    BLOOD RIGHT ANTECUBITAL Performed at [facility], [address] [place], [place] [address]    Special Requests   Final    BOTTLES DRAWN AEROBIC AND ANAEROBIC Blood Culture adequate volume Performed at [facility], [address], [place], [place] [address]    Culture   Final    NO GROWTH 2 DAYS Performed at [facility], [address], [place], [place] [address]    Report Status PENDING  Incomplete  SARS Coronavirus 2 by RT PCR (hospital order, performed in [facility] hospital lab) Nasopharyngeal Nasopharyngeal Swab     Status: None   Collection Time: 06/09/20 12:40 PM   Specimen: Nasopharyngeal Swab  Result Value Ref Range Status   SARS Coronavirus 2 NEGATIVE NEGATIVE Final    Comment: (NOTE) SARS-CoV-2 target nucleic acids are NOT DETECTED.  The SARS-CoV-2 RNA is generally detectable in upper and lower respiratory specimens during the acute phase of infection. The lowest concentration of SARS-CoV-2 viral copies this assay can detect is 250 copies / mL. A negative result does not preclude SARS-CoV-2 infection and should not be used as the sole basis for treatment or other patient management decisions.  A negative result may occur with improper specimen collection / handling, submission of specimen other than nasopharyngeal swab, presence of viral mutation(s) within the areas targeted by this assay, and inadequate number of viral copies (<250 copies / mL). A negative result must be combined with clinical observations, patient history, and epidemiological information.  Fact Sheet for Patients:   BoilerBrush.com.cy  Fact Sheet for Healthcare Providers: https://pope.com/  This test is not yet approved or  cleared by the [place] FDA and has been authorized for detection and/or diagnosis of SARS-CoV-2 by FDA under an Emergency Use Authorization (EUA).  This EUA will remain in effect (meaning this test can be used) for the duration of the COVID-19 declaration under Section 564(b)(1) of the Act, 21 U.S.C. section 360bbb-3(b)(1), unless the authorization is terminated or revoked sooner.  Performed at [facility], [address] [place], [place] [address]   Blood Culture (routine x 2)     Status: None (Preliminary result)   Collection Time: 06/09/20 12:45 PM   Specimen: BLOOD  Result Value Ref Range Status   Specimen Description   Final    BLOOD BLOOD LEFT WRIST Performed at [facility], [address] [place], [place] [address]    Special Requests   Final    BOTTLES DRAWN AEROBIC AND ANAEROBIC Blood Culture adequate volume Performed at [facility], [address] [place], [place] [address]    Culture   Final    NO GROWTH 2 DAYS Performed at [facility], [address], [place], [place] [address]    Report Status PENDING  Incomplete  Rapid Influenza A&B Antigens     Status: None   Collection Time: 06/09/20  2:20 PM   Specimen: Respiratory  Result Value Ref Range Status   Influenza A ([facility]) NEGATIVE NEGATIVE Final   Influenza B ([facility]) NEGATIVE NEGATIVE Final    Comment: Negative results do not exclude influenza virus infection, and influenza should still be considered if clinical suspicion is high. Performed at [facility], [address] [place], [place] [address]       Radiology Studies: DG Chest [place] 1 View  Result Date: 06/09/2020 CLINICAL DATA:  Cough.  Shortness of breath.  Fever. EXAM: PORTABLE CHEST 1 VIEW COMPARISON:  Two-view chest x-ray 06/06/2015 FINDINGS: The heart size is normal. Atherosclerotic changes are noted at the aortic arch. Subtle opacity in the right middle and lower lobe is concerning for early infection. IMPRESSION: Subtle opacity in the right middle and lower lobe is concerning for early infection. Electronically Signed   By: Marin Roberts M.D.   On: 06/09/2020 12:42       LOS: 2 days   Hayzel Ruberg Rito Ehrlich  [facility] Pager on [email or web address]  06/11/2020, 11:29 AM

## 2020-06-12 LAB — CBC
HCT: 41.6 % (ref 39.0–52.0)
Hemoglobin: 13.7 g/dL (ref 13.0–17.0)
MCH: 30 pg (ref 26.0–34.0)
MCHC: 32.9 g/dL (ref 30.0–36.0)
MCV: 91.2 fL (ref 80.0–100.0)
Platelets: 145 10*3/uL — ABNORMAL LOW (ref 150–400)
RBC: 4.56 MIL/uL (ref 4.22–5.81)
RDW: 13.7 % (ref 11.5–15.5)
WBC: 7 10*3/uL (ref 4.0–10.5)
nRBC: 0 % (ref 0.0–0.2)

## 2020-06-12 LAB — BASIC METABOLIC PANEL
Anion gap: 9 (ref 5–15)
BUN: 18 mg/dL (ref 6–20)
CO2: 30 mmol/L (ref 22–32)
Calcium: 8.6 mg/dL — ABNORMAL LOW (ref 8.9–10.3)
Chloride: 97 mmol/L — ABNORMAL LOW (ref 98–111)
Creatinine, Ser: 0.75 mg/dL (ref 0.61–1.24)
GFR, Estimated: >60 mL/min (ref >60)
Glucose, Bld: 95 mg/dL (ref 70–99)
Potassium: 4.4 mmol/L (ref 3.5–5.1)
Sodium: 136 mmol/L (ref 135–145)

## 2020-06-12 MED ORDER — AZITHROMYCIN 500 MG PO TABS: 500.0000 mg | ORAL_TABLET | Freq: Every day | ORAL | 0 refills | Status: AC

## 2020-06-12 MED ORDER — ALBUTEROL SULFATE HFA 108 (90 BASE) MCG/ACT IN AERS: INHALATION_SPRAY | RESPIRATORY_TRACT | 1 refills | Status: DC

## 2020-06-12 MED ORDER — GUAIFENESIN ER 600 MG PO TB12: 600.0000 mg | ORAL_TABLET | Freq: Two times a day (BID) | ORAL | 0 refills | Status: AC

## 2020-06-12 MED ORDER — CEFDINIR 300 MG PO CAPS: 300.0000 mg | ORAL_CAPSULE | Freq: Two times a day (BID) | ORAL | 0 refills | Status: AC

## 2020-06-12 MED ORDER — PREDNISONE 20 MG PO TABS: ORAL_TABLET | ORAL | 0 refills | Status: DC

## 2020-06-12 NOTE — Discharge Instructions (Signed)

## 2020-06-12 NOTE — Progress Notes (Signed)
Pt alert and oriented. D/C instructions given, pt d/cd to home. 

## 2020-06-12 NOTE — Discharge Summary (Signed)
[facility]  Physician Discharge Summary   Patient ID: Todd Mercado MRN: [id number] DOB/AGE: July 07, 1960 60 y.o.  Admit date: 06/09/2020 Discharge date: 06/12/2020  PCP: Felix Pacini A, DO  DISCHARGE DIAGNOSES:  Community-acquired pneumonia COPD with acute exacerbation Acute respiratory failure with hypoxia, resolved Essential hypertension  RECOMMENDATIONS FOR OUTPATIENT FOLLOW UP: 1. Follow-up with PCP in 1 to 2 weeks   Home Health: None Equipment/Devices: None  CODE STATUS: Full code  DISCHARGE CONDITION: fair  Diet recommendation: As before  INITIAL HISTORY: 60 y.o.malewith history of COPD (not on home O2), hypertension, OSA, who presents with several days of shortness of breath.  Patient was found to have pneumonia on x-ray.  There was also concern for exacerbation of his COPD.  He had new oxygen requirement.  He was hospitalized for further management.   HOSPITAL COURSE:    Acute respiratory failure with hypoxia/community-acquired pneumonia/acute COPD exacerbation Patient tested negative for influenza and COVID-19.  Chest x-ray showed right-sided opacities.  Patient was started on IV antibiotics.  He was placed on nebulizer treatments and steroids.  He slowly started improving.  He was initially requiring oxygen.  Subsequently was weaned off of same.  Saturating in the 90s with ambulation on room air.  He will be discharged on a few more days of antibiotics and a steroid taper.  Essential hypertension Stable  Hyperkalemia Resolved with Lokelma  Obesity Estimated body mass index is 32.14 kg/m as calculated from the following:   Height as of this encounter: 6' (1.829 m).   Weight as of this encounter: 107.5 kg.   Patient is stable.  Okay for discharge home today.   PERTINENT LABS:  The results of significant diagnostics from this hospitalization (including imaging, microbiology, ancillary and laboratory) are listed below for reference.     Microbiology: Recent Results (from the past 240 hour(s))  Blood Culture (routine x 2)     Status: None (Preliminary result)   Collection Time: 06/09/20 12:00 PM   Specimen: BLOOD  Result Value Ref Range Status   Specimen Description   Final    BLOOD RIGHT ANTECUBITAL Performed at [facility], [address] [place], [place] [address]    Special Requests   Final    BOTTLES DRAWN AEROBIC AND ANAEROBIC Blood Culture adequate volume Performed at [facility], [address] [place], [place] [address]    Culture   Final    NO GROWTH 3 DAYS Performed at [facility], [address] [place], [place] [address]    Report Status PENDING  Incomplete  SARS Coronavirus 2 by RT PCR (hospital order, performed in [facility] hospital lab) Nasopharyngeal Nasopharyngeal Swab     Status: None   Collection Time: 06/09/20 12:40 PM   Specimen: Nasopharyngeal Swab  Result Value Ref Range Status   SARS Coronavirus 2 NEGATIVE NEGATIVE Final    Comment: (NOTE) SARS-CoV-2 target nucleic acids are NOT DETECTED.  The SARS-CoV-2 RNA is generally detectable in upper and lower respiratory specimens during the acute phase of infection. The lowest concentration of SARS-CoV-2 viral copies this assay can detect is 250 copies / mL. A negative result does not preclude SARS-CoV-2 infection and should not be used as the sole basis for treatment or other patient management decisions.  A negative result may occur with improper specimen collection / handling, submission of specimen other than nasopharyngeal swab, presence of viral mutation(s) within the areas targeted by this assay, and inadequate number of viral  copies (<250 copies / mL). A negative result must be combined with clinical observations, patient history, and epidemiological information.  Fact Sheet for Patients:   BoilerBrush.com.cy  Fact Sheet for Healthcare  Providers: https://pope.com/  This test is not yet approved or  cleared by the [place] FDA and has been authorized for detection and/or diagnosis of SARS-CoV-2 by FDA under an Emergency Use Authorization (EUA).  This EUA will remain in effect (meaning this test can be used) for the duration of the COVID-19 declaration under Section 564(b)(1) of the Act, 21 U.S.C. section 360bbb-3(b)(1), unless the authorization is terminated or revoked sooner.  Performed at [facility], [address] [place], [place] [address]   Blood Culture (routine x 2)     Status: None (Preliminary result)   Collection Time: 06/09/20 12:45 PM   Specimen: BLOOD  Result Value Ref Range Status   Specimen Description   Final    BLOOD BLOOD LEFT WRIST Performed at [facility], [address] [place], [place] [address]    Special Requests   Final    BOTTLES DRAWN AEROBIC AND ANAEROBIC Blood Culture adequate volume Performed at [facility], [address] [place], [place] [address]    Culture   Final    NO GROWTH 3 DAYS Performed at [facility], [address] [place], [place] [address]    Report Status PENDING  Incomplete  Rapid Influenza A&B Antigens     Status: None   Collection Time: 06/09/20  2:20 PM   Specimen: Respiratory  Result Value Ref Range Status   Influenza A ([facility]) NEGATIVE NEGATIVE Final   Influenza B ([facility]) NEGATIVE NEGATIVE Final    Comment: Negative results do not exclude influenza virus infection, and influenza should still be considered if clinical suspicion is high. Performed at [facility], [address] [place], [place] [address]      Labs:  COVID-19 Labs   Lab Results  Component Value Date   SARSCOV2NAA NEGATIVE 06/09/2020      Basic Metabolic Panel: Recent Labs  Lab 06/09/20 1209 06/10/20 0537 06/11/20 0431 06/12/20 0453  NA 133* 138 138 136  K 4.5 4.5 5.3* 4.4  CL 93* 99  97* 97*  CO2 28 28 32 30  GLUCOSE 109* 136* 114* 95  BUN 18 25* 21* 18  CREATININE 1.22 0.77 0.77 0.75  CALCIUM 8.7* 8.6* 8.4* 8.6*   Liver Function Tests: Recent Labs  Lab 06/10/20 0537  AST 20  ALT 19  ALKPHOS 52  BILITOT 0.6  PROT 7.2  ALBUMIN 3.5   CBC: Recent Labs  Lab 06/09/20 1209 06/10/20 0537 06/11/20 0431 06/12/20 0453  WBC 8.4 8.1 12.4* 7.0  HGB 16.0 13.9 13.6 13.7  HCT 48.6 42.0 42.2 41.6  MCV 91.7 90.5 93.6 91.2  PLT 155 155 156 145*     IMAGING STUDIES DG Chest Port 1 View  Result Date: 06/09/2020 CLINICAL DATA:  Cough.  Shortness of breath.  Fever. EXAM: PORTABLE CHEST 1 VIEW COMPARISON:  Two-view chest x-ray 06/06/2015 FINDINGS: The heart size is normal. Atherosclerotic changes are noted at the aortic arch. Subtle opacity in the right middle and lower lobe is concerning for early infection. IMPRESSION: Subtle opacity in the right middle and lower lobe is concerning for early infection. Electronically Signed   By: Marin Roberts M.D.   On: 06/09/2020 12:42    DISCHARGE EXAMINATION: Vitals:   06/11/20 1937  06/11/20 2147 06/12/20 0600 06/12/20 0741  BP:  127/83 135/84   Pulse:  (!) 52 (!) 45   Resp:  18 18   Temp:  97.8 F (36.6 C) (!) 97.2 F (36.2 C)   TempSrc:  Oral Oral   SpO2: 93% 95% 95% 96%  Weight:      Height:       General appearance: Awake alert.  In no distress Resp: Normal effort.  Much improved air entry with significantly less wheezing today compared to the last 2 days. Cardio: S1-S2 is normal regular.  No S3-S4.  No rubs murmurs or bruit GI: Abdomen is soft.  Nontender nondistended.  Bowel sounds are present normal.  No masses organomegaly    DISPOSITION: Home  Discharge Instructions    Call MD for:  difficulty breathing, headache or visual disturbances   Complete by: As directed    Call MD for:  extreme fatigue   Complete by: As directed    Call MD for:  hives   Complete by: As directed    Call MD for:  persistant  dizziness or light-headedness   Complete by: As directed    Call MD for:  persistant nausea and vomiting   Complete by: As directed    Call MD for:  severe uncontrolled pain   Complete by: As directed    Call MD for:  temperature >100.4   Complete by: As directed    Diet - low sodium heart healthy   Complete by: As directed    Discharge instructions   Complete by: As directed    Please take your medications as prescribed.  Please be sure to follow-up with your primary care provider in 1 to 2 weeks.  Stay out of work till this weekend.  Use the albuterol inhaler as directed.  You were cared for by a hospitalist during your hospital stay. If you have any questions about your discharge medications or the care you received while you were in the hospital after you are discharged, you can call the unit and asked to speak with the hospitalist on call if the hospitalist that took care of you is not available. Once you are discharged, your primary care physician will handle any further medical issues. Please note that NO REFILLS for any discharge medications will be authorized once you are discharged, as it is imperative that you return to your primary care physician (or establish a relationship with a primary care physician if you do not have one) for your aftercare needs so that they can reassess your need for medications and monitor your lab values. If you do not have a primary care physician, you can call [phone] for a physician referral.   Increase activity slowly   Complete by: As directed        Allergies as of 06/12/2020   No Known Allergies     Medication List    STOP taking these medications   fluticasone 50 MCG/ACT nasal spray Commonly known as: FLONASE   traZODone 50 MG tablet Commonly known as: DESYREL     TAKE these medications   acetaminophen 500 MG tablet Commonly known as: TYLENOL Take 1,000 mg by mouth every 6 (six) hours as needed for fever or headache (pain).    albuterol 108 (90 Base) MCG/ACT inhaler Commonly known as: VENTOLIN HFA Use as directed What changed:   how much to take  how to take this  when to take this  reasons to take this  additional  instructions   amLODipine 10 MG tablet Commonly known as: NORVASC Take 1 tablet (10 mg total) by mouth daily.   azithromycin 500 MG tablet Commonly known as: ZITHROMAX Take 1 tablet (500 mg total) by mouth daily for 2 days.   budesonide-formoterol 160-4.5 MCG/ACT inhaler Commonly known as: SYMBICORT Inhale 2 puffs into the lungs in the morning and at bedtime.   cefdinir 300 MG capsule Commonly known as: OMNICEF Take 1 capsule (300 mg total) by mouth every 12 (twelve) hours for 4 days.   guaiFENesin 600 MG 12 hr tablet Commonly known as: MUCINEX Take 1 tablet (600 mg total) by mouth 2 (two) times daily for 15 days.   naproxen sodium 220 MG tablet Commonly known as: ALEVE Take 220 mg by mouth 2 (two) times daily as needed (pain/headache).   predniSONE 20 MG tablet Commonly known as: DELTASONE Take 2 tablets once daily for 3 days, then take 1 tablet once daily for 5 days, then STOP.         Follow-up Information    Kuneff, Renee A, DO. Schedule an appointment as soon as possible for a visit in 1 week(s).   Specialty: Family Medicine Contact information: [address] [place] [address] [phone]               TOTAL DISCHARGE TIME: 35 minutes  Jevon Littlepage Rito Ehrlich  [facility] Pager on [email or web address]  06/12/2020, 12:48 PM

## 2020-06-12 NOTE — Progress Notes (Signed)
SATURATION QUALIFICATIONS: (This note is used to comply with regulatory documentation for home oxygen)  Patient Saturations on Room Air at Rest = 95  Patient Saturations on Room Air while Ambulating = 95%  Patient Saturations on  [place] of oxygen while Ambulating = 0  Please briefly explain why patient needs home oxygen: MD notified of O2 sat while walking

## 2020-06-14 ENCOUNTER — Telehealth: Payer: Self-pay

## 2020-06-14 LAB — CULTURE, BLOOD (ROUTINE X 2)
Culture: NO GROWTH
Culture: NO GROWTH
Special Requests: ADEQUATE
Special Requests: ADEQUATE

## 2020-06-14 NOTE — Telephone Encounter (Signed)
Transition Care Management Follow-up Telephone Call  Date of discharge and from where: 06/12/20-[facility]  How have you been since you were released from the hospital? Per patient's mother-he is doing very well.  Any questions or concerns? No  Items Reviewed:  Did the pt receive and understand the discharge instructions provided? Yes   Medications obtained and verified? Yes   Other? Yes   Any new allergies since your discharge? No   Dietary orders reviewed? Yes  Do you have support at home? Yes   Home Care and Equipment/Supplies: Were home health services ordered? no If so, what is the name of the agency? n/a  Has the agency set up a time to come to the patient's home? not applicable Were any new equipment or medical supplies ordered?  No What is the name of the medical supply agency? n/a Were you able to get the supplies/equipment? not applicable Do you have any questions related to the use of the equipment or supplies? n/a  Functional Questionnaire: (I = Independent and D = Dependent) ADLs: I  Bathing/Dressing- I  Meal Prep- I  Eating- I  Maintaining continence- I  Transferring/Ambulation- I  Managing Meds- I  Follow up appointments reviewed:   PCP Hospital f/u appt confirmed? Yes  Scheduled to see Dr. Claiborne Billings on 06/21/20 @ 11:00.  Specialist Hospital f/u appt confirmed? N/A   Are transportation arrangements needed? No   If their condition worsens, is the pt aware to call PCP or go to the Emergency Dept.? Yes  Was the patient provided with contact information for the PCP's office or ED? Yes  Was to pt encouraged to call back with questions or concerns? Yes

## 2020-06-20 ENCOUNTER — Other Ambulatory Visit: Payer: Self-pay

## 2020-06-21 ENCOUNTER — Encounter: Payer: Self-pay | Admitting: Family Medicine

## 2020-06-21 ENCOUNTER — Ambulatory Visit (HOSPITAL_BASED_OUTPATIENT_CLINIC_OR_DEPARTMENT_OTHER)
Admission: RE | Admit: 2020-06-21 | Discharge: 2020-06-21 | Disposition: A | Discharge status: Home / Self Care | Payer: 59 | Source: Ambulatory Visit | Attending: Family Medicine | Admitting: Family Medicine

## 2020-06-21 ENCOUNTER — Ambulatory Visit: Payer: 59 | Admitting: Family Medicine

## 2020-06-21 VITALS — BP 120/69 | HR 64 | Temp 98.4°F | Ht 72.0 in | Wt 223.0 lb

## 2020-06-21 DIAGNOSIS — J439 Emphysema, unspecified: Secondary | ICD-10-CM

## 2020-06-21 DIAGNOSIS — S99922A Unspecified injury of left foot, initial encounter: Secondary | ICD-10-CM | POA: Insufficient documentation

## 2020-06-21 DIAGNOSIS — J189 Pneumonia, unspecified organism: Secondary | ICD-10-CM

## 2020-06-21 DIAGNOSIS — J441 Chronic obstructive pulmonary disease with (acute) exacerbation: Secondary | ICD-10-CM | POA: Diagnosis not present

## 2020-06-21 DIAGNOSIS — Z9289 Personal history of other medical treatment: Secondary | ICD-10-CM | POA: Insufficient documentation

## 2020-06-21 HISTORY — DX: Pneumonia, unspecified organism: J18.9

## 2020-06-21 MED ORDER — PREDNISONE 20 MG PO TABS: ORAL_TABLET | ORAL | 0 refills | Status: DC

## 2020-06-21 MED ORDER — FLUTICASONE FUROATE-VILANTEROL 200-25 MCG/INH IN AEPB: 1.0000 | INHALATION_SPRAY | Freq: Every day | RESPIRATORY_TRACT | 11 refills | Status: DC

## 2020-06-21 NOTE — Patient Instructions (Addendum)
Get repeat chest xray at [facility] March 4-7th. I will call you with those results.     Go have foot xray today. We will call you with those results tomorrow.   Restart prednisone for 10 day taper.    Great job! So glad you stopped smoking.

## 2020-06-21 NOTE — Progress Notes (Signed)
Todd Mercado , 1960-09-06, 60 y.o., male MRN: [id number] Patient Care Team    Relationship Specialty Notifications Start End  Natalia Leatherwood, DO PCP - General Family Medicine  07/02/15   Frederico Hamman, MD Consulting Physician Orthopedic Surgery  09/04/17     Chief Complaint  Patient presents with  . Hospitalization Follow-up    Pt has quit smoking      Subjective:  Todd Mercado  is a 60 y.o. male presents for hospital follow up after recent admission on 06/09/2020 for primary diagnosi community acquired pneumonia.  Patient was discharged on 06/12/2020 to home. Patients discharge summary has been reviewed, as well as all labs/image studies obtained during hospitalization.   Patients hospital course: Patient presented to the ED after several days of shortness of breath found to have pneumonia on x-ray.  He required oxygen supplementation initially.  He was started on IV antibiotics and prednisone.  Provided with inhalers and albuterol treatment.  Patient was negative for influenza and Covid. Since hospital discharge patient reports he feels much improved.  He is breathing much better.  He is returned to his normal activities.  He has quit smoking.  He denies any fever, fatigue or worsening cough.  He reports they did start him on Breo inhaler and he is wondering if he can get this in place of the Symbicort.  Foot injury: Patient reports yesterday he stepped in a ditch with his left foot.  At that time he did not feel he injured the foot and was able to continue walking and finish working that day.  However yesterday evening it started to become tender.  And when he woke up this morning it hurt with weightbearing.  He has wrapped it in an Ace bandage which seems to be helpful and he has been able to walk on it today with some mild discomfort.  He denies any swelling or redness of the foot.  He states it is painful around the middle inside portion of his foot.  He denies any injury to this  area in the past.  No results for input(s): HGB, HCT, WBC, PLT in the last 168 hours. CMP Latest Ref Rng & Units 06/12/2020 06/11/2020 06/10/2020  Glucose 70 - 99 mg/dL 95 163(W) 466(Z)  BUN 6 - 20 mg/dL 18 99(J) 57(S)  Creatinine 0.61 - 1.24 mg/dL 1.77 9.39 0.30  Sodium 135 - 145 mmol/L 136 138 138  Potassium 3.5 - 5.1 mmol/L 4.4 5.3(H) 4.5  Chloride 98 - 111 mmol/L 97(L) 97(L) 99  CO2 22 - 32 mmol/L 30 32 28  Calcium 8.9 - 10.3 mg/dL 0.9(Q) 3.3(A) 0.7(M)  Total Protein 6.5 - 8.1 g/dL - - 7.2  Total Bilirubin 0.3 - 1.2 mg/dL - - 0.6  Alkaline Phos 38 - 126 U/L - - 52  AST 15 - 41 U/L - - 20  ALT 0 - 44 U/L - - 19    DG Chest Port 1 View  Result Date: 06/09/2020 CLINICAL DATA:  Cough.  Shortness of breath.  Fever. EXAM: PORTABLE CHEST 1 VIEW COMPARISON:  Two-view chest x-ray 06/06/2015 FINDINGS: The heart size is normal. Atherosclerotic changes are noted at the aortic arch. Subtle opacity in the right middle and lower lobe is concerning for early infection. IMPRESSION: Subtle opacity in the right middle and lower lobe is concerning for early infection. Electronically Signed   By: Marin Roberts M.D.   On: 06/09/2020 12:42     Depression screen  [facility] 2/9 06/21/2020 01/27/2020 06/21/2018 09/01/2017  Decreased Interest 0 0 0 0  Down, Depressed, Hopeless 0 0 0 0  PHQ - 2 Score 0 0 0 0  Altered sleeping - 3 - -  Tired, decreased energy - 2 - -  Change in appetite - 0 - -  Feeling bad or failure about yourself  - 0 - -  Trouble concentrating - 0 - -  Moving slowly or fidgety/restless - 0 - -  Suicidal thoughts - 0 - -  PHQ-9 Score - 5 - -    No Known Allergies Social History   Tobacco Use  . Smoking status: Former Smoker    Packs/day: 1.00    Years: 30.00    Pack years: 30.00    Types: Cigarettes    Start date: 06/07/2020  . Smokeless tobacco: Current User    Types: Snuff  . Tobacco comment: discussed benefits of quitting  Substance Use Topics  . Alcohol use: Yes     Alcohol/week: 12.0 standard drinks    Types: 12 Cans of beer per week    Comment: 12 beers on weekend   Past Medical History:  Diagnosis Date  . Allergy   . Arthritis   . Cigar smoker motivated to quit   . COPD (chronic obstructive pulmonary disease) (HCC)   . Hypertension   . OSA (obstructive sleep apnea) 2004   CPAP recommended- to meet with home health 02/26/16  . Wears dentures    Past Surgical History:  Procedure Laterality Date  . DENTAL SURGERY    . TOTAL KNEE ARTHROPLASTY Right 03/07/2016   Procedure: TOTAL KNEE ARTHROPLASTY;  Surgeon: Frederico Hamman, MD;  Location: [facility];  Service: Orthopedics;  Laterality: Right;   Family History  Problem Relation Age of Onset  . Hypertension Mother   . COPD Mother   . Arthritis Mother   . Heart disease Mother        atrial fibrillation  . Emphysema Father   . Early death Father   . Arthritis Maternal Aunt   . Diabetes Maternal Aunt   . COPD Maternal Uncle   . Heart failure Maternal Grandfather    Allergies as of 06/21/2020   No Known Allergies     Medication List       Accurate as of June 21, 2020  1:34 PM. If you have any questions, ask your nurse or doctor.        STOP taking these medications   naproxen sodium 220 MG tablet Commonly known as: ALEVE Stopped by: Felix Pacini, DO     TAKE these medications   acetaminophen 500 MG tablet Commonly known as: TYLENOL Take 1,000 mg by mouth every 6 (six) hours as needed for fever or headache (pain).   albuterol 108 (90 Base) MCG/ACT inhaler Commonly known as: VENTOLIN HFA Use as directed   amLODipine 10 MG tablet Commonly known as: NORVASC Take 1 tablet (10 mg total) by mouth daily.   budesonide-formoterol 160-4.5 MCG/ACT inhaler Commonly known as: SYMBICORT Inhale 2 puffs into the lungs in the morning and at bedtime.   fluticasone furoate-vilanterol 200-25 MCG/INH Aepb Commonly known as: BREO ELLIPTA Inhale 1 puff into the lungs daily. Started by: Felix Pacini, DO   guaiFENesin 600 MG 12 hr tablet Commonly known as: MUCINEX Take 1 tablet (600 mg total) by mouth 2 (two) times daily for 15 days.   predniSONE 20 MG tablet Commonly known as: DELTASONE 60 mg x3d, 40 mg x3d, 20 mg  x2d, 10 mg x2d What changed: additional instructions Changed by: Felix Pacini, DO       All past medical history, surgical history, allergies, family history, immunizations and medications were updated in the EMR today and reviewed under the history and medication portions of their EMR.      ROS: Negative, with the exception of above mentioned in HPI   Objective:  BP 120/69   Pulse 64   Temp 98.4 F (36.9 C) (Oral)   Ht 6' (1.829 m)   Wt 223 lb (101.2 kg)   SpO2 96%   BMI 30.24 kg/m  Body mass index is 30.24 kg/m. Gen: Afebrile. No acute distress. Nontoxic in appearance, well developed, well nourished.  HENT: AT. [place].  No cough or hoarseness present. Eyes:Pupils Equal Round Reactive to light, Extraocular movements intact,  Conjunctiva without redness, discharge or icterus. Neck/lymp/endocrine: Supple, no lymphadenopathy CV: RRR murmur, no edema Chest: Bilateral wheezing present.  Good air movement, normal resp effort.  MSK: Left foot/ankle: No erythema, no bruising, possible very mild soft tissue swelling present.  Tenderness to palpation over proximal medial foot and ankle.  Neurovascularly intact distally Skin: No rashes, purpura or petechiae.  Neuro:  Normal gait. PERLA. EOMi. Alert. Oriented x3  Psych: Normal affect, dress and demeanor. Normal speech. Normal thought content and judgment.  Assessment/Plan: Todd Mercado is a 60 y.o. male present for OV for Hospital discharge follow up Community acquired pneumonia of right lung, unspecified part of lung/Pulmonary emphysema, unspecified emphysema type (HCC)/COPD exacerbation (HCC)/History of recent hospitalization Much improved condition since hospitalization.  Coagulated him-he has also stopped  smoking.   - wheezing on exam> prednisone taper prescribed.  -Patient desired Breo inhaler over Symbicort.  -Prescribed Breo 1 puff daily.  If too expensive for him he will return to the use of Symbicort. - DG Chest 2 View; Future> Pt will have completed March 4-7th and we will call with results  Injury of left foot, initial encounter New injury. TTP medial ankle and proximal medial foot to 1st proximal metatarsal.  Rest. Elevate.compression.ice. nsaids prn.  - DG Foot Complete Left; Future - DG Ankle Complete Left; Future F/u dependent on xray results.  Orthopedic referral will be placed if fractured.    Reviewed expectations re: course of current medical issues.  Discussed self-management of symptoms.  Outlined signs and symptoms indicating need for more acute intervention.  Patient verbalized understanding and all questions were answered.  Patient received an After-Visit Summary.  Any changes in medications were reviewed and patient was provided with updated med list with their AVS.      Orders Placed This Encounter  Procedures  . DG Chest 2 View  . DG Foot Complete Left  . DG Ankle Complete Left    Meds ordered this encounter  Medications  . predniSONE (DELTASONE) 20 MG tablet    Sig: 60 mg x3d, 40 mg x3d, 20 mg x2d, 10 mg x2d    Dispense:  18 tablet    Refill:  0  . fluticasone furoate-vilanterol (BREO ELLIPTA) 200-25 MCG/INH AEPB    Sig: Inhale 1 puff into the lungs daily.    Dispense:  28 each    Refill:  11    If affordable to pt, please dc symbicort. thanks    Note is dictated utilizing voice recognition software. Although note has been proof read prior to signing, occasional typographical errors still can be missed. If any questions arise, please do not hesitate to call for verification.  electronically signed by:  Howard Pouch, DO  [place]

## 2020-09-06 ENCOUNTER — Other Ambulatory Visit: Payer: Self-pay

## 2020-09-06 DIAGNOSIS — I10 Essential (primary) hypertension: Secondary | ICD-10-CM

## 2020-09-06 MED ORDER — AMLODIPINE BESYLATE 10 MG PO TABS: 10.0000 mg | ORAL_TABLET | Freq: Every day | ORAL | 1 refills | Status: DC

## 2020-10-03 ENCOUNTER — Other Ambulatory Visit: Payer: Self-pay

## 2020-10-03 ENCOUNTER — Encounter: Payer: Self-pay | Admitting: Family Medicine

## 2020-10-03 ENCOUNTER — Ambulatory Visit (INDEPENDENT_AMBULATORY_CARE_PROVIDER_SITE_OTHER): Payer: 59 | Admitting: Family Medicine

## 2020-10-03 VITALS — BP 117/69 | HR 71 | Temp 98.3°F | Ht 72.0 in | Wt 225.0 lb

## 2020-10-03 DIAGNOSIS — R234 Changes in skin texture: Secondary | ICD-10-CM

## 2020-10-03 DIAGNOSIS — G4733 Obstructive sleep apnea (adult) (pediatric): Secondary | ICD-10-CM

## 2020-10-03 DIAGNOSIS — J439 Emphysema, unspecified: Secondary | ICD-10-CM | POA: Diagnosis not present

## 2020-10-03 DIAGNOSIS — I10 Essential (primary) hypertension: Secondary | ICD-10-CM | POA: Diagnosis not present

## 2020-10-03 DIAGNOSIS — N529 Male erectile dysfunction, unspecified: Secondary | ICD-10-CM | POA: Diagnosis not present

## 2020-10-03 NOTE — Patient Instructions (Addendum)
We will call you with lab results and discuss further plan   Erectile Dysfunction Erectile dysfunction (ED) is the inability to get or keep an erection in order to have sexual intercourse. ED is considered a symptom of an underlying disorder and not considered a disease. Erectile dysfunction may include:  Inability to get an erection.  Lack of enough hardness of the erection to allow penetration.  Loss of the erection before sex is finished. What are the causes? This condition may be caused by:  Certain medicines, such as: ? Pain relievers. ? Antihistamines. ? Antidepressants. ? Blood pressure medicines. ? Water pills (diuretics). ? Ulcer medicines. ? Muscle relaxants. ? Drugs.  Excessive drinking.  Psychological causes, such as: ? Anxiety. ? Depression. ? Sadness. ? Exhaustion. ? Performance fear. ? Stress.  Physical causes, such as: ? Artery problems. This may include diabetes, smoking, liver disease, or atherosclerosis. ? High blood pressure. ? Hormonal problems, such as low testosterone. ? Obesity. ? Nerve problems. This may include back or pelvic injuries, diabetes mellitus, multiple sclerosis, or Parkinson's disease. What are the signs or symptoms? Symptoms of this condition include:  Inability to get an erection.  Lack of enough hardness of the erection to allow penetration.  Loss of the erection before sex is finished.  Normal erections at some times, but with frequent unsatisfactory episodes.  Low sexual satisfaction in either partner due to erection problems.  A curved penis occurring with erection. The curve may cause pain or the penis may be too curved to allow for intercourse.  Never having nighttime erections. How is this diagnosed? This condition is often diagnosed by:  Performing a physical exam to find other diseases or specific problems with the penis.  Asking you detailed questions about the problem.  Performing blood tests to check  for diabetes mellitus or to measure hormone levels.  Performing other tests to check for underlying health conditions.  Performing an ultrasound exam to check for scarring.  Performing a test to check blood flow to the penis.  Doing a sleep study at home to measure nighttime erections. How is this treated? This condition may be treated by:  Medicine taken by mouth to help you achieve an erection (oral medicine).  Hormone replacement therapy to replace low testosterone levels.  Medicine that is injected into the penis. Your health care provider may instruct you how to give yourself these injections at home.  Vacuum pump. This is a pump with a ring on it. The pump and ring are placed on the penis and used to create pressure that helps the penis become erect.  Penile implant surgery. In this procedure, you may receive: ? An inflatable implant. This consists of cylinders, a pump, and a reservoir. The cylinders can be inflated with a fluid that helps to create an erection, and they can be deflated after intercourse. ? A semi-rigid implant. This consists of two silicone rubber rods. The rods provide some rigidity. They are also flexible, so the penis can both curve downward in its normal position and become straight for sexual intercourse.  Blood vessel surgery, to improve blood flow to the penis. During this procedure, a blood vessel from a different part of the body is placed into the penis to allow blood to flow around (bypass) damaged or blocked blood vessels.  Lifestyle changes, such as exercising more, losing weight, and quitting smoking. Follow these instructions at home: Medicines  Take over-the-counter and prescription medicines only as told by your health care provider.  Do not increase the dosage without first discussing it with your health care provider.  If you are using self-injections, perform injections as directed by your health care provider. Make sure to avoid any veins  that are on the surface of the penis. After giving an injection, apply pressure to the injection site for 5 minutes.   General instructions  Exercise regularly, as directed by your health care provider. Work with your health care provider to lose weight, if needed.  Do not use any products that contain nicotine or tobacco, such as cigarettes and e-cigarettes. If you need help quitting, ask your health care provider.  Before using a vacuum pump, read the instructions that come with the pump and discuss any questions with your health care provider.  Keep all follow-up visits as told by your health care provider. This is important. Contact a health care provider if:  You feel nauseous.  You vomit. Get help right away if:  You are taking oral or injectable medicines and you have an erection that lasts longer than 4 hours. If your health care provider is unavailable, go to the nearest emergency room for evaluation. An erection that lasts much longer than 4 hours can result in permanent damage to your penis.  You have severe pain in your groin or abdomen.  You develop redness or severe swelling of your penis.  You have redness spreading up into your groin or lower abdomen.  You are unable to urinate.  You experience chest pain or a rapid heart beat (palpitations) after taking oral medicines. Summary  Erectile dysfunction (ED) is the inability to get or keep an erection during sexual intercourse. This problem can usually be treated successfully.  This condition is diagnosed based on a physical exam, your symptoms, and tests to determine the cause. Treatment varies depending on the cause and may include medicines, hormone therapy, surgery, or a vacuum pump.  You may need follow-up visits to make sure that you are using your medicines or devices correctly.  Get help right away if you are taking or injecting medicines and you have an erection that lasts longer than 4 hours. This  information is not intended to replace advice given to you by your health care provider. Make sure you discuss any questions you have with your health care provider. Document Revised: 01/06/2020 Document Reviewed: 01/06/2020 Elsevier Patient Education  2021 [employer].

## 2020-10-03 NOTE — Progress Notes (Signed)
Patient ID: Todd Mercado, male   DOB: 03-17-1961, 60 y.o.   MRN: [id number]    Todd Mercado , 11/18/1960, 60 y.o., male MRN: [id number]   Patient Care Team    Relationship Specialty Notifications Start End  Ma Hillock, DO PCP - General Family Medicine  07/02/15   Earlie Server, MD Consulting Physician Orthopedic Surgery  09/04/17      Chief Complaint  Patient presents with  . dermatoporosis    Pt c/o thinning of skin with bruising and tearing easily x 2 years; worsen in last 6 mos;    Subjective: Todd Mercado is a 60 y.o. male present for [facility] follow up.  Hypertension/tobacco use: Pt reports compliance with amlodipine 10 mg daily. Patient denies chest pain, shortness of breath, dizziness or lower extremity edema.   Pt does not take a daily baby ASA. Pt is not prescribed statin.  He continues to smoke daily- but he is trying to quit. Labs UTD 06/2019 Diet: No routine diet Exercise: No routine exercise RF: Hypertension, overweight, smoker, family history present  OSA/pulmonary emphysema: Patient reports he is compliance with his CPAP.   He has  been able to afford his Symbicort but it is expensive. He has a new insurance in a few weeks and may have better coverage or formulary.   Erectile dysfunction: Patient reports he has had erectile dysfunction for at least 2 to 3 years.  He reports he has quit smoking and he has quit drinking.  However alcohol cessation did not improve function.  He is compliant with his CPAP.  He reports he is able to have an erection, he is having difficulty sustaining erection.  Skin changes: Patient reports he has noticed skin changes over his forearm over the last few months.  He reports it looked much worse last week when he made the appointment than it does currently.  Initially he had seen more bruising and he felt his skin was becoming thinner.  He had 2 wounds from just mildly bumping his arm that are now healing.  He states he also had little bumps  over his left forearm which are now resolved.  He had mildly lower platelets on his last labs.  He no longer drinks alcohol.  He states he has taken [place] powders quite a few times over the last couple weeks.  GAD 7 : Generalized Anxiety Score 01/27/2020 12/14/2018  Nervous, Anxious, on Edge 2 1  Control/stop worrying 0 1  Worry too much - different things 0 0  Trouble relaxing 2 2  Restless 0 0  Easily annoyed or irritable 2 1  Afraid - awful might happen 0 0  Total GAD 7 Score 6 5  Anxiety Difficulty - Somewhat difficult   No Known Allergies Social History   Tobacco Use  . Smoking status: Former Smoker    Packs/day: 1.00    Years: 30.00    Pack years: 30.00    Types: Cigarettes    Start date: 06/07/2020  . Smokeless tobacco: Current User    Types: Snuff  . Tobacco comment: discussed benefits of quitting  Substance Use Topics  . Alcohol use: Yes    Alcohol/week: 12.0 standard drinks    Types: 12 Cans of beer per week    Comment: 12 beers on weekend   Past Medical History:  Diagnosis Date  . Allergy   . Anxiety 06/21/2018  . Arthritis   . Cigar smoker motivated to quit   . Community acquired pneumonia of right  lung 06/21/2020  . COPD (chronic obstructive pulmonary disease) ([place])   . Current every day smoker 06/02/2019  . Hypertension   . Hypoxia 06/09/2020  . OSA (obstructive sleep apnea) 2004   CPAP recommended- to meet with home health 02/26/16  . 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 Wears dentures    Past Surgical History:  Procedure Laterality Date  . DENTAL SURGERY    . TOTAL KNEE ARTHROPLASTY Right 03/07/2016   Procedure: TOTAL KNEE ARTHROPLASTY;  Surgeon: Earlie Server, MD;  Location: [place];  Service: Orthopedics;  Laterality: Right;   Family History  Problem Relation Age of Onset  . Hypertension Mother   . COPD Mother   . Arthritis Mother   . Heart disease Mother        atrial fibrillation  . Emphysema Father   . Early death Father   . Arthritis Maternal Aunt   .  Diabetes Maternal Aunt   . COPD Maternal Uncle   . Heart failure Maternal Grandfather    Allergies as of 10/03/2020   No Known Allergies     Medication List       Accurate as of October 03, 2020 11:59 PM. If you have any questions, ask your nurse or doctor.        STOP taking these medications   budesonide-formoterol 160-4.5 MCG/ACT inhaler Commonly known as: SYMBICORT Stopped by: Howard Pouch, DO   predniSONE 20 MG tablet Commonly known as: DELTASONE Stopped by: Howard Pouch, DO     TAKE these medications   acetaminophen 500 MG tablet Commonly known as: TYLENOL Take 1,000 mg by mouth every 6 (six) hours as needed for fever or headache (pain).   albuterol 108 (90 Base) MCG/ACT inhaler Commonly known as: VENTOLIN HFA Use as directed   amLODipine 10 MG tablet Commonly known as: NORVASC Take 1 tablet (10 mg total) by mouth daily.   fluticasone furoate-vilanterol 200-25 MCG/INH Aepb Commonly known as: BREO ELLIPTA Inhale 1 puff into the lungs daily.      ROS: Negative, with the exception of above mentioned in HPI  Objective:  BP 117/69   Pulse 71   Temp 98.3 F (36.8 C) (Oral)   Ht 6' (1.829 m)   Wt 225 lb (102.1 kg)   SpO2 93%   BMI 30.52 kg/m  Body mass index is 30.52 kg/m. Gen: Afebrile. No acute distress.  Nontoxic, pleasant male HENT: AT. Todd Mercado.  Eyes:Pupils Equal Round Reactive to light, Extraocular movements intact,  Conjunctiva without redness, discharge or icterus. Neck/lymp/endocrine: Supple, no lymphadenopathy, no thyromegaly CV: RRR no murmur, no edema Chest: CTAB, no wheeze or crackles Skin: X1 small puncture wound, healing.  X1 area of granulation left forearm, healing.  X2 small purpura/bruising present. Neuro: Normal gait. PERLA. EOMi. Alert. Oriented x3 Psych: Normal affect, dress and demeanor. Normal speech. Normal thought content and judgment..    Assessment/Plan: Todd Mercado is a 60 y.o. male present for acute OV for  Essential  hypertension, benign/obese Stable. Continue amlodipine 10 mg QD . tried meds:  lisinopril- cough.  Low sodium Diet, exercise He Has stopped smoking!!! Cbc, cmp, tsh  OSA (obstructive sleep apnea)/Pulmonary emphysema, unspecified emphysema type ([place]) Stable Continue Breo.  - Continue albuterol as needed -Compliant with CPAP  Hypocalcemia: CMP, vitamin D and PTH collected today  Erectile dysfunction: Discussed proper use of sildenafil/Viagra like product with him today. Discussed emergent precautions if dizziness or chest pain is experienced with use. Viagra 50 mg daily as needed prescribed today.  Skin changes: Discussed skin changes he is experiencing with him today.  He reports that looked worse last week he has been putting cream on the areas daily.  There are a couple areas of bruising/thin skin that are not concerning.  However there is 1 area on his left forearm he states is healing that appears to be granulation tissue, but may be early changes of atypia.  Discussed watchful waiting versus referral today and he agreed to watch for now and if changes in color or size he will call immediately. Continue using baby gold bond or bag balm daily. Will continue to monitor and his chronic conditions appointments as well.  Meds ordered this encounter  Medications  . amLODipine (NORVASC) 10 MG tablet    Sig: Take 1 tablet (10 mg total) by mouth daily.    Dispense:  90 tablet    Refill:  1    DC lisinopril- med change   Orders Placed This Encounter  Procedures  . CBC w/Diff  . TSH  . Comp Met (CMET)  . Vitamin D (25 hydroxy)  . PTH, Intact and Calcium  . Magnesium   Referral Orders  No referral(s) requested today     electronically signed by:  Howard Pouch, DO  [place]

## 2020-10-04 ENCOUNTER — Telehealth: Payer: Self-pay | Admitting: Family Medicine

## 2020-10-04 DIAGNOSIS — R234 Changes in skin texture: Secondary | ICD-10-CM | POA: Insufficient documentation

## 2020-10-04 LAB — CBC WITH DIFFERENTIAL/PLATELET
Absolute Monocytes: 468 cells/uL (ref 200–950)
Basophils Absolute: 70 cells/uL (ref 0–200)
Basophils Relative: 0.9 %
Eosinophils Absolute: 140 cells/uL (ref 15–500)
Eosinophils Relative: 1.8 %
HCT: 41.2 % (ref 38.5–50.0)
Hemoglobin: 14.1 g/dL (ref 13.2–17.1)
Lymphs Abs: 1303 cells/uL (ref 850–3900)
MCH: 30.1 pg (ref 27.0–33.0)
MCHC: 34.2 g/dL (ref 32.0–36.0)
MCV: 88 fL (ref 80.0–100.0)
MPV: 9.6 fL (ref 7.5–12.5)
Monocytes Relative: 6 %
Neutro Abs: 5819 cells/uL (ref 1500–7800)
Neutrophils Relative %: 74.6 %
Platelets: 202 10*3/uL (ref 140–400)
RBC: 4.68 10*6/uL (ref 4.20–5.80)
RDW: 12.8 % (ref 11.0–15.0)
Total Lymphocyte: 16.7 %
WBC: 7.8 10*3/uL (ref 3.8–10.8)

## 2020-10-04 LAB — COMPREHENSIVE METABOLIC PANEL
AG Ratio: 1.8 (calc) (ref 1.0–2.5)
ALT: 15 U/L (ref 9–46)
AST: 18 U/L (ref 10–35)
Albumin: 4.4 g/dL (ref 3.6–5.1)
Alkaline phosphatase (APISO): 58 U/L (ref 35–144)
BUN: 17 mg/dL (ref 7–25)
CO2: 30 mmol/L (ref 20–32)
Calcium: 9.3 mg/dL (ref 8.6–10.3)
Chloride: 100 mmol/L (ref 98–110)
Creat: 1.14 mg/dL (ref 0.70–1.33)
Globulin: 2.5 g/dL (calc) (ref 1.9–3.7)
Glucose, Bld: 154 mg/dL — ABNORMAL HIGH (ref 65–99)
Potassium: 3.9 mmol/L (ref 3.5–5.3)
Sodium: 138 mmol/L (ref 135–146)
Total Bilirubin: 0.6 mg/dL (ref 0.2–1.2)
Total Protein: 6.9 g/dL (ref 6.1–8.1)

## 2020-10-04 LAB — PTH, INTACT AND CALCIUM
Calcium: 9.3 mg/dL (ref 8.6–10.3)
PTH: 37 pg/mL (ref 16–77)

## 2020-10-04 LAB — MAGNESIUM: Magnesium: 2 mg/dL (ref 1.5–2.5)

## 2020-10-04 LAB — VITAMIN D 25 HYDROXY (VIT D DEFICIENCY, FRACTURES): Vit D, 25-Hydroxy: 29 ng/mL — ABNORMAL LOW (ref 30–100)

## 2020-10-04 LAB — TSH: TSH: 0.85 mIU/L (ref 0.40–4.50)

## 2020-10-04 MED ORDER — SILDENAFIL CITRATE 50 MG PO TABS: 50.0000 mg | ORAL_TABLET | Freq: Every day | ORAL | 5 refills | Status: DC | PRN

## 2020-10-04 MED ORDER — AMLODIPINE BESYLATE 10 MG PO TABS: 10.0000 mg | ORAL_TABLET | Freq: Every day | ORAL | 1 refills | Status: DC

## 2020-10-04 NOTE — Telephone Encounter (Signed)
Spoke with pt regarding labs and instructions.   

## 2020-10-04 NOTE — Telephone Encounter (Signed)
Please call patient Todd Mercado, Todd Mercado function are normal Blood cell counts and electrolytes are normal.  Platelets are also back up into normal range. Vitamin D levels are mildly low at 29.  I would encourage him to add 600 units of vitamin D daily by supplement to bring vitamin D levels into normal range and aid in bone health.  This is over-the-counter.   I have called in sildenafil/Viagra to his pharmacy.  Please advise him this is to be taken 1 hour prior to activity.   - If he experiences a headache with use, lower dose to half a tablet. -If he experiences dizziness or chest pain with use, he must discontinue use of medication and call EMS.  He also will need to tell EMS he took Viagra, since they cannot also give nitroglycerin to chest pain patients that have taken Viagra.  Follow-up in 5 months, sooner if needed.

## 2021-04-30 ENCOUNTER — Telehealth: Payer: Self-pay | Admitting: Family Medicine

## 2021-04-30 NOTE — Telephone Encounter (Signed)
Attempted to contact pt regarding medication refill with number provided. Pt was sent a year supply in Feb and is due for f/u for further refills.

## 2021-04-30 NOTE — Telephone Encounter (Signed)
Pt needing 90 day supply on   fluticasone furoate-vilanterol (BREO ELLIPTA) 200-25 MCG/INH AEPB  [id number] - [place], [place] - [address]

## 2021-05-01 NOTE — Telephone Encounter (Signed)
[facility] at [facility] to get [facility], advised rx has already been transferred but he could request to get 3 inhalers at a time for next refill. No new rx should be needed. Spoke with pt's mother, Bonita Quin and provided update. She voiced understanding.

## 2021-05-08 NOTE — Telephone Encounter (Signed)
Pt called about status of refill for   Pt needing 90 day supply on    fluticasone furoate-vilanterol (BREO ELLIPTA) 200-25 MCG/INH AEPB  Informed pt on 05/01/21 CMA spoke with his mother. [facility] [place] he could request to get 3 inhalers at a time for next refill. No new rx should be needed.

## 2021-05-09 NOTE — Telephone Encounter (Signed)
Noted  

## 2021-05-09 NOTE — Telephone Encounter (Signed)
Attempted to contact pharmacy, no answer and phone disconnected

## 2021-05-09 NOTE — Telephone Encounter (Signed)
Patient following up on refill request.  Please call [phone]

## 2021-05-10 ENCOUNTER — Other Ambulatory Visit: Payer: Self-pay

## 2021-05-10 MED ORDER — FLUTICASONE FUROATE-VILANTEROL 200-25 MCG/ACT IN AEPB: 1.0000 | INHALATION_SPRAY | Freq: Every day | RESPIRATORY_TRACT | 0 refills | Status: DC

## 2021-05-10 NOTE — Telephone Encounter (Signed)
Rx sent to pharmacy   

## 2021-05-13 ENCOUNTER — Other Ambulatory Visit: Payer: Self-pay

## 2021-05-13 MED ORDER — FLUTICASONE FUROATE-VILANTEROL 200-25 MCG/ACT IN AEPB: 1.0000 | INHALATION_SPRAY | Freq: Every day | RESPIRATORY_TRACT | 0 refills | Status: DC

## 2021-05-22 ENCOUNTER — Telehealth: Payer: Self-pay

## 2021-05-22 NOTE — Telephone Encounter (Signed)
While calling patient to reschedule appt on 2/10 with Dr. Claiborne Billings, spoke to mother Bonita Quin [facility]) requested refill on patients medication.  She stated he needs 90 d/s because insurance will pay for his meds all except for copay of $40 approx, if prescription is written to 30 d/s - prescription is over $200. Patient will have to call back to schedule appt with  New preferred pharmacy - [facility] [place]  budesonide-formoterol [facility]) 160-4.5 MCG/ACT inhaler

## 2021-05-23 ENCOUNTER — Telehealth: Payer: Self-pay | Admitting: Family Medicine

## 2021-05-23 MED ORDER — FLUTICASONE FUROATE-VILANTEROL 200-25 MCG/ACT IN AEPB: 1.0000 | INHALATION_SPRAY | Freq: Every day | RESPIRATORY_TRACT | 0 refills | Status: DC

## 2021-05-23 NOTE — Telephone Encounter (Signed)
Spoke with patient regarding results/recommendations.  

## 2021-05-23 NOTE — Addendum Note (Signed)
Addended by: Felix Pacini A on: 05/23/2021 11:58 AM   Modules accepted: Orders

## 2021-05-23 NOTE — Telephone Encounter (Signed)
Breo called in 05/13/2021 was for 90 days. They could be using an old prescription #?

## 2021-05-23 NOTE — Telephone Encounter (Signed)
Changed prescription to reflect 90 d supply

## 2021-05-23 NOTE — Telephone Encounter (Signed)
Medication concern is for the [facility] inhaler. Pt states that every time he goes to the pharmacy they are telling him that it is for a 30 d/s. Please advise on any changes to make Rx a 90 d/s.

## 2021-05-23 NOTE — Telephone Encounter (Signed)
Spoke with pharmacy and was told that they Rx that was sent will only be a 30 d/s because of how it is dispensed. Pharmacy states if we send Rx with 180 he could get the 90 d/s. Pt is scheduled for 06/13/21 OV.

## 2021-05-24 ENCOUNTER — Other Ambulatory Visit: Payer: Self-pay

## 2021-05-24 MED ORDER — FLUTICASONE FUROATE-VILANTEROL 200-25 MCG/ACT IN AEPB: 1.0000 | INHALATION_SPRAY | Freq: Every day | RESPIRATORY_TRACT | 0 refills | Status: DC

## 2021-05-24 NOTE — Telephone Encounter (Signed)
Patient states that [facility] does have a prescription refill from our office for a 90d/s for Breo.  They have the 30 d/s.  Please call [facility] [place] before we leave today so patient can pick up correct prescription before he leaves to go back out of town to work ([place]).

## 2021-05-24 NOTE — Telephone Encounter (Signed)
Rx originally sent to the wrong pharmacy. Sent Rx to [facility]

## 2021-05-27 ENCOUNTER — Other Ambulatory Visit: Payer: Self-pay | Admitting: Family Medicine

## 2021-05-27 DIAGNOSIS — I10 Essential (primary) hypertension: Secondary | ICD-10-CM

## 2021-06-10 ENCOUNTER — Telehealth: Payer: Self-pay | Admitting: Family Medicine

## 2021-06-10 DIAGNOSIS — I10 Essential (primary) hypertension: Secondary | ICD-10-CM

## 2021-06-10 MED ORDER — AMLODIPINE BESYLATE 10 MG PO TABS: 10.0000 mg | ORAL_TABLET | Freq: Every day | ORAL | 0 refills | Status: DC

## 2021-06-10 NOTE — Telephone Encounter (Signed)
Patient notified that rx has been sent in. 

## 2021-06-10 NOTE — Telephone Encounter (Signed)
Pt called about med refill  amLODipine amLODipine (NORVASC) 10 MG tablet     [id number] - [place], [place] - [address] Phone:  [phone]  Fax:  [phone]

## 2021-06-12 ENCOUNTER — Other Ambulatory Visit: Payer: Self-pay

## 2021-06-13 ENCOUNTER — Ambulatory Visit (INDEPENDENT_AMBULATORY_CARE_PROVIDER_SITE_OTHER): Payer: 59 | Admitting: Family Medicine

## 2021-06-13 ENCOUNTER — Encounter: Payer: Self-pay | Admitting: Family Medicine

## 2021-06-13 VITALS — BP 125/76 | HR 65 | Temp 98.0°F | Ht 72.0 in | Wt 225.0 lb

## 2021-06-13 DIAGNOSIS — G4733 Obstructive sleep apnea (adult) (pediatric): Secondary | ICD-10-CM

## 2021-06-13 DIAGNOSIS — N529 Male erectile dysfunction, unspecified: Secondary | ICD-10-CM

## 2021-06-13 DIAGNOSIS — J432 Centrilobular emphysema: Secondary | ICD-10-CM

## 2021-06-13 DIAGNOSIS — J439 Emphysema, unspecified: Secondary | ICD-10-CM | POA: Diagnosis not present

## 2021-06-13 DIAGNOSIS — I10 Essential (primary) hypertension: Secondary | ICD-10-CM | POA: Diagnosis not present

## 2021-06-13 DIAGNOSIS — J441 Chronic obstructive pulmonary disease with (acute) exacerbation: Secondary | ICD-10-CM

## 2021-06-13 MED ORDER — ALBUTEROL SULFATE HFA 108 (90 BASE) MCG/ACT IN AERS: INHALATION_SPRAY | RESPIRATORY_TRACT | 1 refills | Status: DC

## 2021-06-13 MED ORDER — AMLODIPINE BESYLATE 10 MG PO TABS: 10.0000 mg | ORAL_TABLET | Freq: Every day | ORAL | 1 refills | Status: DC

## 2021-06-13 MED ORDER — PREDNISONE 20 MG PO TABS: 40.0000 mg | ORAL_TABLET | Freq: Every day | ORAL | 0 refills | Status: DC

## 2021-06-13 MED ORDER — SILDENAFIL CITRATE 50 MG PO TABS: 50.0000 mg | ORAL_TABLET | Freq: Every day | ORAL | 5 refills | Status: DC | PRN

## 2021-06-13 MED ORDER — AZITHROMYCIN 500 MG PO TABS: 500.0000 mg | ORAL_TABLET | Freq: Every day | ORAL | 0 refills | Status: DC

## 2021-06-13 MED ORDER — FLUTICASONE FUROATE-VILANTEROL 200-25 MCG/ACT IN AEPB: 1.0000 | INHALATION_SPRAY | Freq: Every day | RESPIRATORY_TRACT | 1 refills | Status: DC

## 2021-06-13 NOTE — Patient Instructions (Addendum)
° °  Great to see you today.  I have refilled the medication(s) we provide.   Fasting labs next appt. We will perform your physical and chronic conditions that appt.

## 2021-06-13 NOTE — Progress Notes (Addendum)
Patient ID: Todd Mercado, male   DOB: 07/07/1960, 61 y.o.   MRN: [id number]    Todd Mercado , 04-20-1961, 61 y.o., male MRN: [id number]   Patient Care Team    Relationship Specialty Notifications Start End  Natalia Leatherwood, DO PCP - General Family Medicine  07/02/15   Frederico Hamman, MD Consulting Physician Orthopedic Surgery  09/04/17      Chief Complaint  Patient presents with   Hypertension    Cmc; pt is not fasting    Subjective: Todd Mercado is a 61 y.o. male present for [facility] follow up.  Hypertension/tobacco use: Pt reports compliance with amlodipine 10 mg daily. Patient denies chest pain, shortness of breath, dizziness or lower extremity edema.  Pt does not take a daily baby ASA. Pt is not prescribed statin.  He continues to smoke daily about 1 cig. A day.  Diet: No routine diet Exercise: No routine exercise RF: Hypertension, overweight, smoker, family history present  OSA/pulmonary emphysema: Patient reports he is compliance with his CPAP.   He is compliant with his Breo. He had a resp infection/cold last week and still feels a little tight in his chest. He had to use his albuterol.   Erectile dysfunction: Patient reports he has had erectile dysfunction for at least 2 to 3 years.  He reports he has quit smoking and he has quit drinking.  However alcohol cessation did not improve function.  He is compliant with his CPAP.  He reports he is able to have an erection, he is having difficulty sustaining erection. Last visit we prescribed viagra, and he reports it works good for him without side effects.   Skin changes: Pt reports his arms are much better condition. He has been applying cream daily and all areas are healed.  Prior note: Patient reports he has noticed skin changes over his forearm over the last few months.  He reports it looked much worse last week when he made the appointment than it does currently.  Initially he had seen more bruising and he felt his skin was becoming  thinner.  He had 2 wounds from just mildly bumping his arm that are now healing.  He states he also had little bumps over his left forearm which are now resolved.  He had mildly lower platelets on his last labs.  He no longer drinks alcohol.  He states he has taken [place] powders quite a few times over the last couple weeks.  GAD 7 : Generalized Anxiety Score 01/27/2020 12/14/2018  Nervous, Anxious, on Edge 2 1  Control/stop worrying 0 1  Worry too much - different things 0 0  Trouble relaxing 2 2  Restless 0 0  Easily annoyed or irritable 2 1  Afraid - awful might happen 0 0  Total GAD 7 Score 6 5  Anxiety Difficulty - Somewhat difficult   No Known Allergies Social History   Tobacco Use   Smoking status: Former    Packs/day: 1.00    Years: 30.00    Pack years: 30.00    Types: Cigarettes    Start date: 06/07/2020   Smokeless tobacco: Current    Types: Snuff   Tobacco comments:    discussed benefits of quitting  Substance Use Topics   Alcohol use: Yes    Alcohol/week: 12.0 standard drinks    Types: 12 Cans of beer per week    Comment: 12 beers on weekend   Past Medical History:  Diagnosis Date   Allergy  Anxiety 06/21/2018   Arthritis    Cigar smoker motivated to quit    Community acquired pneumonia of right lung 06/21/2020   COPD (chronic obstructive pulmonary disease) (HCC)    Current every day smoker 06/02/2019   Hypertension    Hypoxia 06/09/2020   OSA (obstructive sleep apnea) 2004   CPAP recommended- to meet with home health 02/26/16   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   Wears dentures    Past Surgical History:  Procedure Laterality Date   DENTAL SURGERY     TOTAL KNEE ARTHROPLASTY Right 03/07/2016   Procedure: TOTAL KNEE ARTHROPLASTY;  Surgeon: Frederico Hamman, MD;  Location: [facility] OR;  Service: Orthopedics;  Laterality: Right;   Family History  Problem Relation Age of Onset   Hypertension Mother    COPD Mother    Arthritis Mother    Heart disease Mother        atrial  fibrillation   Emphysema Father    Early death Father    Arthritis Maternal Aunt    Diabetes Maternal Aunt    COPD Maternal Uncle    Heart failure Maternal Grandfather    Allergies as of 06/13/2021   No Known Allergies      Medication List        Accurate as of June 13, 2021  9:30 AM. If you have any questions, ask your nurse or doctor.          acetaminophen 500 MG tablet Commonly known as: TYLENOL Take 1,000 mg by mouth every 6 (six) hours as needed for fever or headache (pain).   albuterol 108 (90 Base) MCG/ACT inhaler Commonly known as: VENTOLIN HFA Use as directed   amLODipine 10 MG tablet Commonly known as: NORVASC Take 1 tablet (10 mg total) by mouth daily.   azithromycin 500 MG tablet Commonly known as: Zithromax Take 1 tablet (500 mg total) by mouth daily. Started by: Felix Pacini, DO   fluticasone furoate-vilanterol 200-25 MCG/ACT Aepb Commonly known as: BREO ELLIPTA Inhale 1 puff into the lungs daily.   predniSONE 20 MG tablet Commonly known as: DELTASONE Take 2 tablets (40 mg total) by mouth daily with breakfast. Started by: Felix Pacini, DO   sildenafil 50 MG tablet Commonly known as: Viagra Take 1 tablet (50 mg total) by mouth daily as needed for erectile dysfunction.       ROS: Negative, with the exception of above mentioned in HPI  Objective:  BP 125/76    Pulse 65    Temp 98 F (36.7 C) (Oral)    Ht 6' (1.829 m)    Wt 225 lb (102.1 kg)    SpO2 94%    BMI 30.52 kg/m  Body mass index is 30.52 kg/m. Physical Exam Vitals and nursing note reviewed.  Constitutional:      General: He is not in acute distress.    Appearance: Normal appearance. He is not ill-appearing, toxic-appearing or diaphoretic.  HENT:     Head: Normocephalic and atraumatic.     Mouth/Throat:     Mouth: Mucous membranes are moist.  Eyes:     General: No scleral icterus.       Right eye: No discharge.        Left eye: No discharge.     Extraocular Movements:  Extraocular movements intact.     Pupils: Pupils are equal, round, and reactive to light.  Cardiovascular:     Rate and Rhythm: Normal rate and regular rhythm.  Pulmonary:     Effort:  Pulmonary effort is normal. No respiratory distress.     Breath sounds: Normal breath sounds. No wheezing, rhonchi or rales.  Musculoskeletal:     Cervical back: Neck supple.  Lymphadenopathy:     Cervical: No cervical adenopathy.  Skin:    General: Skin is warm and dry.     Coloration: Skin is not jaundiced or pale.     Findings: No rash.  Neurological:     Mental Status: He is alert and oriented to person, place, and time. Mental status is at baseline.  Psychiatric:        Mood and Affect: Mood normal.        Behavior: Behavior normal.        Thought Content: Thought content normal.        Judgment: Judgment normal.   Assessment/Plan: Kyandre Okray is a 61 y.o. male present for acute OV for  Essential hypertension, benign/obese Stable.  Continue  amlodipine 10 mg QD . tried meds:  lisinopril- cough.  Low sodium Diet, exercise   OSA (obstructive sleep apnea)/Pulmonary emphysema, unspecified emphysema type (HCC) Stable Continue  Breo.  - Continue albuterol as needed -Compliant with CPAP  Hypocalcemia: CMP, vitamin D and PTH collected today  Erectile dysfunction: Discussed proper use of sildenafil/Viagra like product with him today. Discussed emergent precautions if dizziness or chest pain is experienced with use. Continue Viagra 50 mg daily as needed prescribed today.  Skin changes: Resolved.   COPD exacerbation: Azith 500 x3 and prednisone burst prescribed.   Return in 24 weeks (on 11/28/2021) for CPE, [facility]MC (30 min).   Meds ordered this encounter  Medications   amLODipine (NORVASC) 10 MG tablet    Sig: Take 1 tablet (10 mg total) by mouth daily.    Dispense:  90 tablet    Refill:  1   fluticasone furoate-vilanterol (BREO ELLIPTA) 200-25 MCG/ACT AEPB    Sig: Inhale 1 puff into  the lungs daily.    Dispense:  180 each    Refill:  1   albuterol (VENTOLIN HFA) 108 (90 Base) MCG/ACT inhaler    Sig: Use as directed    Dispense:  6.7 g    Refill:  1   DISCONTD: sildenafil (VIAGRA) 50 MG tablet    Sig: Take 1 tablet (50 mg total) by mouth daily as needed for erectile dysfunction.    Dispense:  10 tablet    Refill:  5   predniSONE (DELTASONE) 20 MG tablet    Sig: Take 2 tablets (40 mg total) by mouth daily with breakfast.    Dispense:  10 tablet    Refill:  0   azithromycin (ZITHROMAX) 500 MG tablet    Sig: Take 1 tablet (500 mg total) by mouth daily.    Dispense:  3 tablet    Refill:  0   sildenafil (VIAGRA) 50 MG tablet    Sig: Take 1 tablet (50 mg total) by mouth daily as needed for erectile dysfunction.    Dispense:  10 tablet    Refill:  5    May dispense in 90d if insurance allows. W/ 1 refll   No orders of the defined types were placed in this encounter.  Referral Orders  No referral(s) requested today     electronically signed by:  Felix Pacini, DO  [facility] - [place]

## 2021-06-13 NOTE — Addendum Note (Signed)
Addended by: Howard Pouch A on: 06/13/2021 09:31 AM   Modules accepted: Orders

## 2021-06-14 ENCOUNTER — Ambulatory Visit: Payer: 59 | Admitting: Family Medicine

## 2021-08-12 NOTE — Telephone Encounter (Signed)
ERROR

## 2021-08-19 ENCOUNTER — Telehealth: Payer: Self-pay

## 2021-08-19 NOTE — Telephone Encounter (Signed)
Patient refill request.  [facility] [place] ?Pharmacy states he does not have any refills on this medication ? ? ?fluticasone furoate-vilanterol (BREO ELLIPTA) 200-25 MCG/ACT AEPB [[id number]]  ?

## 2021-08-20 ENCOUNTER — Telehealth: Payer: Self-pay | Admitting: Family Medicine

## 2021-08-20 NOTE — Telephone Encounter (Signed)
Attempted to contact pt regarding medication. Unable to LVM, due to VM not being set up yet.  ?

## 2021-08-20 NOTE — Telephone Encounter (Signed)
Noted  

## 2021-08-20 NOTE — Telephone Encounter (Signed)
Understood. A little hairy up here and a different place please be patient with me. ? ?

## 2021-08-20 NOTE — Telephone Encounter (Signed)
See encounter

## 2021-08-20 NOTE — Telephone Encounter (Signed)
Todd Mercado called in return for a call he received. He is going to call the pharmacy to see if his inhaler has been sent. ?

## 2021-08-20 NOTE — Telephone Encounter (Signed)
Please see the other encounter

## 2021-08-20 NOTE — Telephone Encounter (Signed)
McKissick, Myrene Buddy R 18 minutes ago (11:09 AM)  ? ?YM ?Mr Prestia called in return for a call he received. He is going to call the pharmacy to see if his inhaler has been sent.  ?  ? ?

## 2021-08-21 MED ORDER — FLUTICASONE FUROATE-VILANTEROL 200-25 MCG/ACT IN AEPB: 1.0000 | INHALATION_SPRAY | Freq: Every day | RESPIRATORY_TRACT | 0 refills | Status: DC

## 2021-08-21 NOTE — Addendum Note (Signed)
Addended by: Paschal Dopp on: 08/21/2021 01:48 PM ? ? Modules accepted: Orders ? ?

## 2021-08-21 NOTE — Telephone Encounter (Signed)
Spoke with pharmacy and they do not have the most recent Rx that was sent in feb. Rx resent with no refills ?

## 2021-08-30 IMAGING — CR DG CHEST 2V
2 series · 2 of 2 positions shown · non-contrast
Comparison: 06/09/2020

CLINICAL DATA: 59-year-old male with pneumonia

EXAM:
CHEST - 2 VIEW

[w chest pa]
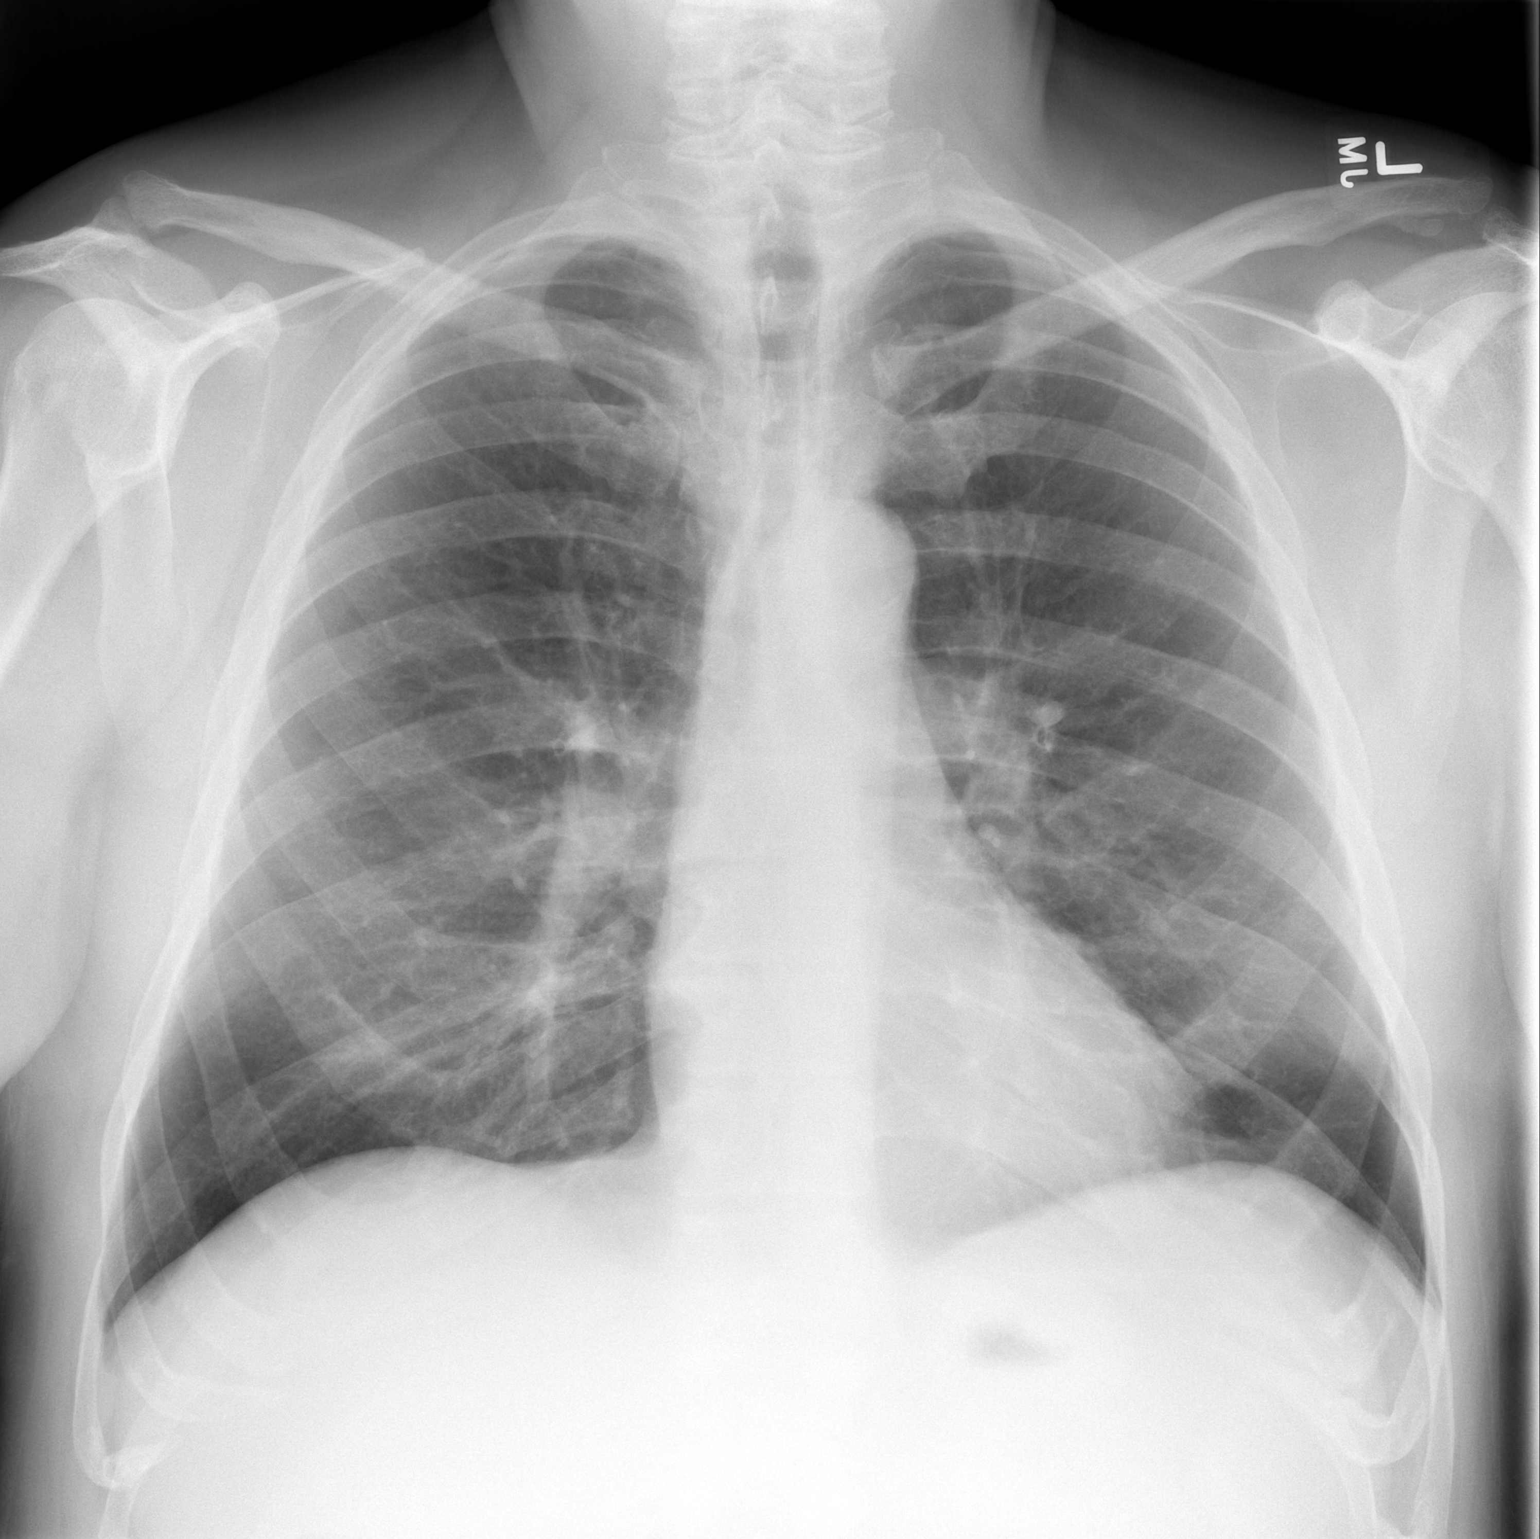

[w chest lat]
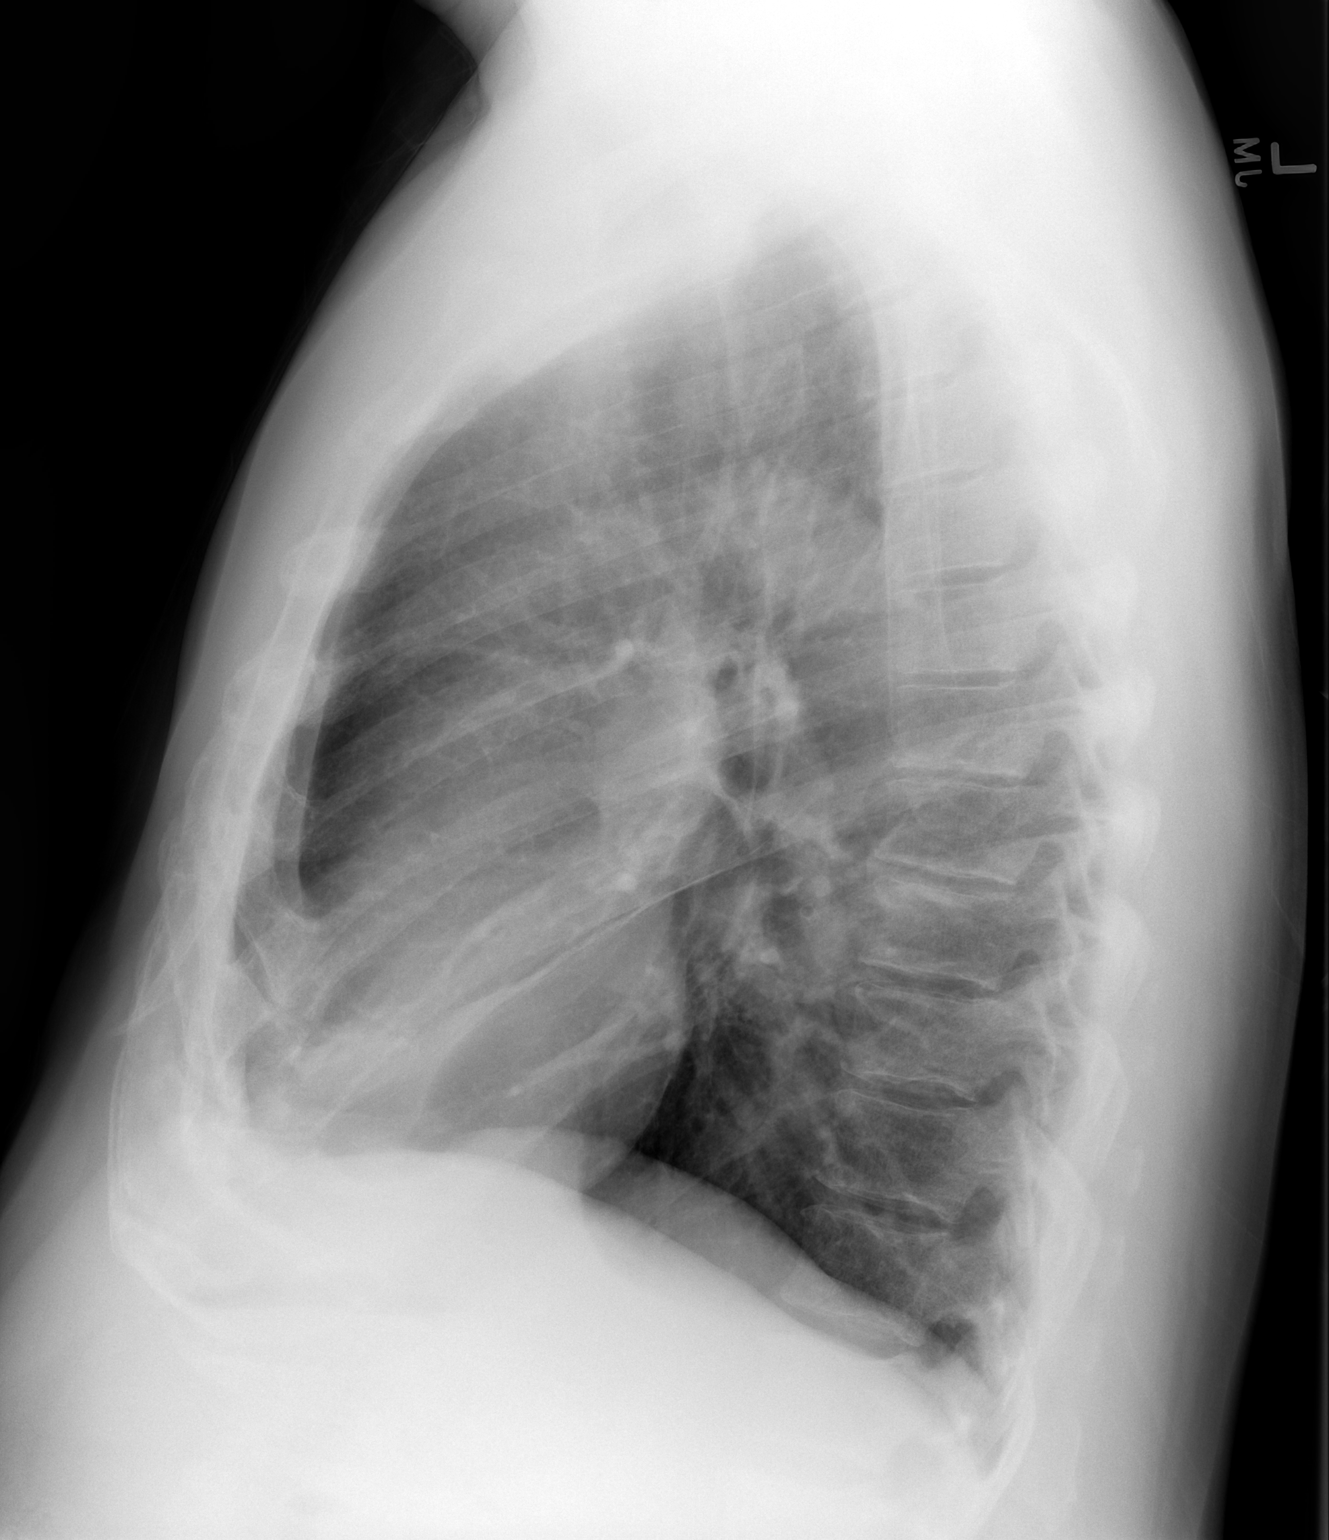

[2 of 2 positions shown; findings below may reference images not displayed]

FINDINGS: Cardiomediastinal silhouette unchanged in size and contour. No
pneumothorax or pleural effusion. No confluent airspace disease,
with overall improved aeration compared to the prior. Similar
appearance mildly coarsened interstitial markings.

No displaced fracture.  Degenerative changes of the spine.
IMPRESSION: Improved aeration, without evidence of acute cardiopulmonary disease

## 2021-11-21 ENCOUNTER — Other Ambulatory Visit: Payer: Self-pay

## 2021-11-28 ENCOUNTER — Other Ambulatory Visit: Payer: Self-pay

## 2021-11-28 ENCOUNTER — Encounter: Payer: 59 | Admitting: Family Medicine

## 2021-11-28 ENCOUNTER — Telehealth: Payer: Self-pay

## 2021-11-28 MED ORDER — FLUTICASONE FUROATE-VILANTEROL 200-25 MCG/ACT IN AEPB: 1.0000 | INHALATION_SPRAY | Freq: Every day | RESPIRATORY_TRACT | 0 refills | Status: DC

## 2021-11-28 NOTE — Telephone Encounter (Signed)
Patient states that his pharmacy told him they have sent over multiple requests to get approval to fill Breo medication. He will be out of medication tomorrow. Patient has CPE appt next week 8/4 with Dr. Claiborne Billings  [facility] [place]  fluticasone furoate-vilanterol (BREO ELLIPTA) 200-25 MCG/ACT AEPB [[id number]]

## 2021-11-28 NOTE — Telephone Encounter (Signed)
Rx sent 

## 2021-12-06 ENCOUNTER — Encounter: Payer: Self-pay | Admitting: Family Medicine

## 2021-12-06 ENCOUNTER — Ambulatory Visit (INDEPENDENT_AMBULATORY_CARE_PROVIDER_SITE_OTHER): Payer: 59 | Admitting: Family Medicine

## 2021-12-06 VITALS — BP 122/70 | HR 61 | Temp 97.7°F | Ht 72.0 in | Wt 229.0 lb

## 2021-12-06 DIAGNOSIS — I10 Essential (primary) hypertension: Secondary | ICD-10-CM

## 2021-12-06 DIAGNOSIS — R062 Wheezing: Secondary | ICD-10-CM | POA: Diagnosis not present

## 2021-12-06 DIAGNOSIS — Z125 Encounter for screening for malignant neoplasm of prostate: Secondary | ICD-10-CM

## 2021-12-06 DIAGNOSIS — Z79899 Other long term (current) drug therapy: Secondary | ICD-10-CM

## 2021-12-06 DIAGNOSIS — Z0000 Encounter for general adult medical examination without abnormal findings: Secondary | ICD-10-CM | POA: Diagnosis not present

## 2021-12-06 DIAGNOSIS — J432 Centrilobular emphysema: Secondary | ICD-10-CM | POA: Diagnosis not present

## 2021-12-06 DIAGNOSIS — N529 Male erectile dysfunction, unspecified: Secondary | ICD-10-CM

## 2021-12-06 DIAGNOSIS — G4733 Obstructive sleep apnea (adult) (pediatric): Secondary | ICD-10-CM | POA: Diagnosis not present

## 2021-12-06 MED ORDER — FLUTICASONE FUROATE-VILANTEROL 200-25 MCG/ACT IN AEPB
1.0000 | INHALATION_SPRAY | Freq: Every day | RESPIRATORY_TRACT | 1 refills | Status: DC
Start: 2021-12-06 — End: 2022-05-30

## 2021-12-06 MED ORDER — SILDENAFIL CITRATE 50 MG PO TABS
50.0000 mg | ORAL_TABLET | Freq: Every day | ORAL | 5 refills | Status: DC | PRN
Start: 2021-12-06 — End: 2022-05-30

## 2021-12-06 MED ORDER — IPRATROPIUM-ALBUTEROL 0.5-2.5 (3) MG/3ML IN SOLN
3.0000 mL | Freq: Once | RESPIRATORY_TRACT | Status: AC
  Administered 2021-12-06: 3 mL via RESPIRATORY_TRACT

## 2021-12-06 MED ORDER — AMLODIPINE BESYLATE 10 MG PO TABS: 10.0000 mg | ORAL_TABLET | Freq: Every day | ORAL | 1 refills | Status: DC

## 2021-12-06 NOTE — Patient Instructions (Signed)
No follow-ups on file.        Great to see you today.  I have refilled the medication(s) we provide.   If labs were collected, we will inform you of lab results once received either by echart message or telephone call.   - echart message- for normal results that have been seen by the patient already.   - telephone call: abnormal results or if patient has not viewed results in their echart. Health Maintenance, Male Adopting a healthy lifestyle and getting preventive care are important in promoting health and wellness. Ask your health care provider about: The right schedule for you to have regular tests and exams. Things you can do on your own to prevent diseases and keep yourself healthy. What should I know about diet, weight, and exercise? Eat a healthy diet  Eat a diet that includes plenty of vegetables, fruits, low-fat dairy products, and lean protein. Do not eat a lot of foods that are high in solid fats, added sugars, or sodium. Maintain a healthy weight Body mass index (BMI) is a measurement that can be used to identify possible weight problems. It estimates body fat based on height and weight. Your health care provider can help determine your BMI and help you achieve or maintain a healthy weight. Get regular exercise Get regular exercise. This is one of the most important things you can do for your health. Most adults should: Exercise for at least 150 minutes each week. The exercise should increase your heart rate and make you sweat (moderate-intensity exercise). Do strengthening exercises at least twice a week. This is in addition to the moderate-intensity exercise. Spend less time sitting. Even light physical activity can be beneficial. Watch cholesterol and blood lipids Have your blood tested for lipids and cholesterol at 59 years of age, then have this test every 5 years. You may need to have your cholesterol levels checked more often if: Your lipid or cholesterol levels  are high. You are older than 59 years of age. You are at high risk for heart disease. What should I know about cancer screening? Many types of cancers can be detected early and may often be prevented. Depending on your health history and family history, you may need to have cancer screening at various ages. This may include screening for: Colorectal cancer. Prostate cancer. Skin cancer. Lung cancer. What should I know about heart disease, diabetes, and high blood pressure? Blood pressure and heart disease High blood pressure causes heart disease and increases the risk of stroke. This is more likely to develop in people who have high blood pressure readings or are overweight. Talk with your health care provider about your target blood pressure readings. Have your blood pressure checked: Every 3-5 years if you are 18-39 years of age. Every year if you are 40 years old or older. If you are between the ages of 65 and 75 and are a current or former smoker, ask your health care provider if you should have a one-time screening for abdominal aortic aneurysm (AAA). Diabetes Have regular diabetes screenings. This checks your fasting blood sugar level. Have the screening done: Once every three years after age 45 if you are at a normal weight and have a low risk for diabetes. More often and at a younger age if you are overweight or have a high risk for diabetes. What should I know about preventing infection? Hepatitis B If you have a higher risk for hepatitis B, you should be screened for this   virus. Talk with your health care provider to find out if you are at risk for hepatitis B infection. Hepatitis C Blood testing is recommended for: Everyone born from 1945 through 1965. Anyone with known risk factors for hepatitis C. Sexually transmitted infections (STIs) You should be screened each year for STIs, including gonorrhea and chlamydia, if: You are sexually active and are younger than 59 years of  age. You are older than 59 years of age and your health care provider tells you that you are at risk for this type of infection. Your sexual activity has changed since you were last screened, and you are at increased risk for chlamydia or gonorrhea. Ask your health care provider if you are at risk. Ask your health care provider about whether you are at high risk for HIV. Your health care provider may recommend a prescription medicine to help prevent HIV infection. If you choose to take medicine to prevent HIV, you should first get tested for HIV. You should then be tested every 3 months for as long as you are taking the medicine. Follow these instructions at home: Alcohol use Do not drink alcohol if your health care provider tells you not to drink. If you drink alcohol: Limit how much you have to 0-2 drinks a day. Know how much alcohol is in your drink. In the [place] one drink equals one 12 oz bottle of beer (355 mL), one 5 oz glass of wine (148 mL), or one 1 oz glass of hard liquor (44 mL). Lifestyle Do not use any products that contain nicotine or tobacco. These products include cigarettes, chewing tobacco, and vaping devices, such as e-cigarettes. If you need help quitting, ask your health care provider. Do not use street drugs. Do not share needles. Ask your health care provider for help if you need support or information about quitting drugs. General instructions Schedule regular health, dental, and eye exams. Stay current with your vaccines. Tell your health care provider if: You often feel depressed. You have ever been abused or do not feel safe at home. Summary Adopting a healthy lifestyle and getting preventive care are important in promoting health and wellness. Follow your health care provider's instructions about healthy diet, exercising, and getting tested or screened for diseases. Follow your health care provider's instructions on monitoring your cholesterol and blood  pressure. This information is not intended to replace advice given to you by your health care provider. Make sure you discuss any questions you have with your health care provider. Document Revised: 09/10/2020 Document Reviewed: 09/10/2020 Elsevier Patient Education  2023 [employer]  

## 2021-12-06 NOTE — Progress Notes (Signed)
Patient ID: Todd Mercado, male  DOB: Oct 02, 1960, 61 y.o.   MRN: [id number] Patient Care Team    Relationship Specialty Notifications Start End  Ma Hillock, DO PCP - General Family Medicine  07/02/15   Earlie Server, MD Consulting Physician Orthopedic Surgery  09/04/17     Chief Complaint  Patient presents with   Annual Exam    Pt is not fasting    Subjective: Todd Mercado is a 61 y.o. male present for CPE. All past medical history, surgical history, allergies, family history, immunizations, medications and social history were updated in the electronic medical record today. All recent labs, ED visits and hospitalizations within the last year were reviewed.  Health maintenance:  Colonoscopy: cologuard normal 08/2019. Immunizations:  tdap declined, influenza (encouraged) , zostavax declined Infectious disease screening: HIV and Hep C completed.  PSA: No results found for: "PSA", pt was counseled on prostate cancer screenings. >collected today Assistive device: none Oxygen [id number] Patient has a Dental home. Hospitalizations/ED visits: reviewed  Hypertension/tobacco use: Pt reports compliance with amlodipine 10 mg daily. Patient denies chest pain, shortness of breath, dizziness or lower extremity edema.  Pt does not take a daily baby ASA. Pt is not prescribed statin.  He continues to smoke daily about 1 cig. A day.  Diet: No routine diet Exercise: No routine exercise RF: Hypertension, overweight, smoker, family history present  OSA/pulmonary emphysema: Patient reports he is compliance with his CPAP.   He is compliant with his Breo.   Erectile dysfunction: Patient reports he has had erectile dysfunction for at least 2 to 3 years.  He reports he has quit smoking and he has quit drinking.  However alcohol cessation did not improve function.  He is compliant with his CPAP.  He reports he is able to have an erection, he is having difficulty sustaining erection. Viagra is  working well and he reports it works good for him without side effects.      06/13/2021    8:48 AM 06/21/2020   10:55 AM 01/27/2020   10:43 AM 06/21/2018   12:27 PM 09/01/2017    3:23 PM  Depression screen PHQ 2/9  Decreased Interest 0 0 0 0 0  Down, Depressed, Hopeless 0 0 0 0 0  PHQ - 2 Score 0 0 0 0 0  Altered sleeping   3    Tired, decreased energy   2    Change in appetite   0    Feeling bad or failure about yourself    0    Trouble concentrating   0    Moving slowly or fidgety/restless   0    Suicidal thoughts   0    PHQ-9 Score   5        01/27/2020   10:44 AM 12/14/2018    1:05 PM  GAD 7 : Generalized Anxiety Score  Nervous, Anxious, on Edge 2 1  Control/stop worrying 0 1  Worry too much - different things 0 0  Trouble relaxing 2 2  Restless 0 0  Easily annoyed or irritable 2 1  Afraid - awful might happen 0 0  Total GAD 7 Score 6 5  Anxiety Difficulty  Somewhat difficult           06/21/2018   12:27 PM  Fall Risk   Falls in the past year? 0  Number falls in past yr: 0  Injury with Fall? 0  Follow up Falls evaluation completed  Immunization History  Administered Date(s) Administered   Influenza,inj,Quad PF,6+ Mos 02/11/2013, 01/23/2016, 03/22/2018, 06/10/2019, 01/27/2020   Influenza-Unspecified 04/29/2021   PFIZER(Purple Top)SARS-COV-2 Vaccination 08/08/2019, 09/04/2019   Pfizer Covid-19 Vaccine Bivalent Booster 36yrs & up 04/29/2021   Pneumococcal Polysaccharide-23 02/11/2013     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   Wears dentures    No Known Allergies Past Surgical  History:  Procedure Laterality Date   DENTAL SURGERY     TOTAL KNEE ARTHROPLASTY Right 03/07/2016   Procedure: TOTAL KNEE ARTHROPLASTY;  Surgeon: Frederico Hamman, MD;  Location: [facility] OR;  Service: Orthopedics;  Laterality: Right;   Family History  Problem Relation Age of Onset   Hypertension Mother    COPD Mother    Arthritis Mother    Heart disease Mother        atrial fibrillation   Atrial fibrillation Mother    Emphysema Father    Early death Father    Arthritis Maternal Aunt    Diabetes Maternal Aunt    COPD Maternal Uncle    Heart failure Maternal Grandfather    Social History   Social History Narrative   Mr. Want is divorced. He has 3 children (Todd Mercado, Todd Mercado and Todd Mercado)   HS education, employed as a Media planner, operates heavy machinery   Heavy tobacco use > 45 pack year history, started smoking at age 53.    Alcohol use. No drug use.    Wears his seatbelt, smoke detector in the home.   wears dentures   Feels safe in relationships.     Allergies as of 12/06/2021   No Known Allergies      Medication List        Accurate as of December 06, 2021  3:15 PM. If you have any questions, ask your nurse or doctor.          STOP taking these medications    acetaminophen 500 MG tablet Commonly known as: TYLENOL Stopped by: Felix Pacini, DO   azithromycin 500 MG tablet Commonly known as: Zithromax Stopped by: Felix Pacini, DO   predniSONE 20 MG tablet Commonly known as: DELTASONE Stopped by: Felix Pacini, DO       TAKE these medications    albuterol 108 (90 Base) MCG/ACT inhaler Commonly known as: VENTOLIN HFA Use as directed   amLODipine 10 MG tablet Commonly known as: NORVASC Take 1 tablet (10 mg total) by mouth daily.   fluticasone furoate-vilanterol 200-25 MCG/ACT Aepb Commonly known as: BREO ELLIPTA Inhale 1 puff into the lungs daily.   sildenafil 50 MG tablet Commonly known as: Viagra Take 1 tablet (50 mg total) by mouth daily as needed for erectile  dysfunction.       All past medical history, surgical history, allergies, family history, immunizations andmedications were updated in the EMR today and reviewed under the history and medication portions of their EMR.     No results found for this or any previous visit (from the past 2160 hour(s)).  ROS 14 pt review of systems performed and negative (unless mentioned in  an HPI)  Objective: BP 122/70   Pulse 61   Temp 97.7 F (36.5 C) (Oral)   Ht 6' (1.829 m)   Wt 229 lb (103.9 kg)   SpO2 92%   BMI 31.06 kg/m  Physical Exam Constitutional:      General: He is not in acute distress.    Appearance: Normal appearance. He is not ill-appearing, toxic-appearing or diaphoretic.  HENT:     Head: Normocephalic and atraumatic.     Right Ear: Tympanic membrane, ear canal and external ear normal. There is no impacted cerumen.     Left Ear: Tympanic membrane, ear canal and external ear normal. There is no impacted cerumen.     Nose: Nose normal. No congestion or rhinorrhea.     Mouth/Throat:     Mouth: Mucous membranes are moist.     Pharynx: Oropharynx is clear. No oropharyngeal exudate or posterior oropharyngeal erythema.  Eyes:     General: No scleral icterus.       Right eye: No discharge.        Left eye: No discharge.     Extraocular Movements: Extraocular movements intact.     Pupils: Pupils are equal, round, and reactive to light.  Cardiovascular:     Rate and Rhythm: Normal rate and regular rhythm.     Pulses: Normal pulses.     Heart sounds: Normal heart sounds. No murmur heard.    No friction rub. No gallop.  Pulmonary:     Effort: Pulmonary effort is normal. No respiratory distress.     Breath sounds: Normal breath sounds. No stridor. No wheezing, rhonchi or rales.  Chest:     Chest wall: No tenderness.  Abdominal:     General: Abdomen is flat. Bowel sounds are normal. There is no distension.     Palpations: Abdomen is soft. There is no mass.     Tenderness: There  is no abdominal tenderness. There is no right CVA tenderness, left CVA tenderness, guarding or rebound.     Hernia: No hernia is present.  Musculoskeletal:        General: No swelling or tenderness. Normal range of motion.     Cervical back: Normal range of motion and neck supple.     Right lower leg: No edema.     Left lower leg: No edema.  Lymphadenopathy:     Cervical: No cervical adenopathy.  Skin:    General: Skin is warm and dry.     Coloration: Skin is not jaundiced.     Findings: No bruising, lesion or rash.  Neurological:     General: No focal deficit present.     Mental Status: He is alert and oriented to person, place, and time. Mental status is at baseline.     Cranial Nerves: No cranial nerve deficit.     Sensory: No sensory deficit.     Motor: No weakness.     Coordination: Coordination normal.     Gait: Gait normal.     Deep Tendon Reflexes: Reflexes normal.  Psychiatric:        Mood and Affect: Mood normal.        Behavior: Behavior normal.        Thought Content: Thought content normal.        Judgment: Judgment normal.     No results found.  Assessment/plan: Estle Huguley is a 61 y.o. male present for CPE/CMC Essential hypertension, benign/Morbid obesity (HCC) Stable Continue  amlodipine 10 mg QD .  tried meds:  lisinopril- cough.  Low sodium Diet, exercise Cbc, cmp, ths, lipids collected today   OSA (obstructive sleep apnea)/Pulmonary emphysema, unspecified emphysema type (HCC)/wheezing stable Continue   Breo.  - continue  albuterol as needed Duoneb tx cleared wheezing in office -Compliant with CPAP   Erectile dysfunction: Discussed proper use of sildenafil/Viagra like product with him today. Discussed emergent precautions if dizziness or chest pain is experienced with use. Continue Viagra 50 mg daily as needed prescribed today. Encounter for long-term current use of medication - Hemoglobin A1c Prostate cancer screening - PSA   Routine  general medical examination at a health care facility Colonoscopy: cologuard normal 08/2019. Immunizations:  tdap declined, influenza (encouraged) , zostavax declined Infectious disease screening: HIV and Hep C completed.  PSA: No results found for: "PSA", pt was counseled on prostate cancer screenings. >collected today Patient was encouraged to exercise greater than 150 minutes a week. Patient was encouraged to choose a diet filled with fresh fruits and vegetables, and lean meats. AVS provided to patient today for education/recommendation on gender specific health and safety maintenance.  Return in about 24 weeks (around 05/23/2022) for Routine chronic condition follow-up.   Orders Placed This Encounter  Procedures   CBC   Comprehensive metabolic panel   Hemoglobin A1c   Lipid panel   PSA   TSH   Meds ordered this encounter  Medications   amLODipine (NORVASC) 10 MG tablet    Sig: Take 1 tablet (10 mg total) by mouth daily.    Dispense:  90 tablet    Refill:  1   fluticasone furoate-vilanterol (BREO ELLIPTA) 200-25 MCG/ACT AEPB    Sig: Inhale 1 puff into the lungs daily.    Dispense:  180 each    Refill:  1   sildenafil (VIAGRA) 50 MG tablet    Sig: Take 1 tablet (50 mg total) by mouth daily as needed for erectile dysfunction.    Dispense:  10 tablet    Refill:  5    May dispense in 90d if insurance allows. W/ 1 refll   ipratropium-albuterol (DUONEB) 0.5-2.5 (3) MG/3ML nebulizer solution 3 mL   Referral Orders  No referral(s) requested today     Note is dictated utilizing voice recognition software. Although note has been proof read prior to signing, occasional typographical errors still can be missed. If any questions arise, please do not hesitate to call for verification.  Electronically signed by: Howard Pouch, DO [place]

## 2021-12-07 LAB — COMPREHENSIVE METABOLIC PANEL
AG Ratio: 1.5 (calc) (ref 1.0–2.5)
ALT: 18 U/L (ref 9–46)
AST: 16 U/L (ref 10–35)
Albumin: 4.3 g/dL (ref 3.6–5.1)
Alkaline phosphatase (APISO): 61 U/L (ref 35–144)
BUN: 11 mg/dL (ref 7–25)
CO2: 32 mmol/L (ref 20–32)
Calcium: 9.5 mg/dL (ref 8.6–10.3)
Chloride: 97 mmol/L — ABNORMAL LOW (ref 98–110)
Creat: 0.88 mg/dL (ref 0.70–1.30)
Globulin: 2.9 g/dL (calc) (ref 1.9–3.7)
Glucose, Bld: 142 mg/dL — ABNORMAL HIGH (ref 65–99)
Potassium: 4 mmol/L (ref 3.5–5.3)
Sodium: 137 mmol/L (ref 135–146)
Total Bilirubin: 0.5 mg/dL (ref 0.2–1.2)
Total Protein: 7.2 g/dL (ref 6.1–8.1)

## 2021-12-07 LAB — TSH: TSH: 1.24 mIU/L (ref 0.40–4.50)

## 2021-12-07 LAB — LIPID PANEL
Cholesterol: 169 mg/dL (ref <200)
HDL: 56 mg/dL (ref >=40)
LDL Cholesterol (Calc): 80 mg/dL (calc)
Non-HDL Cholesterol (Calc): 113 mg/dL (calc) (ref <130)
Total CHOL/HDL Ratio: 3 (calc) (ref <5.0)
Triglycerides: 235 mg/dL — ABNORMAL HIGH (ref <150)

## 2021-12-07 LAB — CBC
HCT: 44.2 % (ref 38.5–50.0)
Hemoglobin: 15.2 g/dL (ref 13.2–17.1)
MCH: 31 pg (ref 27.0–33.0)
MCHC: 34.4 g/dL (ref 32.0–36.0)
MCV: 90.2 fL (ref 80.0–100.0)
MPV: 9.6 fL (ref 7.5–12.5)
Platelets: 156 10*3/uL (ref 140–400)
RBC: 4.9 10*6/uL (ref 4.20–5.80)
RDW: 13.7 % (ref 11.0–15.0)
WBC: 7.4 10*3/uL (ref 3.8–10.8)

## 2021-12-07 LAB — HEMOGLOBIN A1C
Hgb A1c MFr Bld: 5.2 % of total Hgb (ref <5.7)
Mean Plasma Glucose: 103 mg/dL
eAG (mmol/L): 5.7 mmol/L

## 2021-12-07 LAB — PSA: PSA: 0.42 ng/mL (ref <=4.00)

## 2021-12-09 ENCOUNTER — Telehealth: Payer: Self-pay | Admitting: Family Medicine

## 2021-12-09 NOTE — Telephone Encounter (Signed)
Spoke with patient regarding results/recommendations.  

## 2021-12-09 NOTE — Telephone Encounter (Signed)
Please call patient Liver, kidney and thyroid function are normal Blood cell counts and electrolytes are normal Diabetes screening/A1c is normal  Prostate cancer screening is normal Cholesterol panel with an LDL/bad cholesterol 80.  His triglycerides are mildly elevated, however this was not a fasting lab therefore the level of his triglycerides are not concerning.

## 2022-01-28 ENCOUNTER — Other Ambulatory Visit: Payer: Self-pay

## 2022-01-28 DIAGNOSIS — J439 Emphysema, unspecified: Secondary | ICD-10-CM

## 2022-01-28 MED ORDER — ALBUTEROL SULFATE HFA 108 (90 BASE) MCG/ACT IN AERS: INHALATION_SPRAY | RESPIRATORY_TRACT | 0 refills | Status: DC

## 2022-01-29 ENCOUNTER — Other Ambulatory Visit: Payer: Self-pay

## 2022-01-29 DIAGNOSIS — J439 Emphysema, unspecified: Secondary | ICD-10-CM

## 2022-01-29 MED ORDER — ALBUTEROL SULFATE HFA 108 (90 BASE) MCG/ACT IN AERS: INHALATION_SPRAY | RESPIRATORY_TRACT | 5 refills | Status: DC

## 2022-01-29 NOTE — Telephone Encounter (Signed)
Please advise on directions? 

## 2022-02-04 ENCOUNTER — Telehealth: Payer: Self-pay | Admitting: Family Medicine

## 2022-02-04 NOTE — Telephone Encounter (Signed)
Pharmicist called and states they have faxed and have been trying to reach someone for week to correct the directions for albuterol. "Use as directed" is not acceptable for insurance to cover. You can contact the pharmacy at [phone] or fax a new rx to [phone]

## 2022-02-04 NOTE — Telephone Encounter (Signed)
Spoke with pharmacy regarding recommendations.  

## 2022-02-05 ENCOUNTER — Other Ambulatory Visit: Payer: Self-pay

## 2022-02-05 DIAGNOSIS — J439 Emphysema, unspecified: Secondary | ICD-10-CM

## 2022-03-04 ENCOUNTER — Telehealth: Payer: Self-pay

## 2022-03-04 NOTE — Telephone Encounter (Signed)
Pt should have refill at pharmacy. If pt is doing 1 puff daily according to Rx he should have enough for 1 yr.

## 2022-03-06 DIAGNOSIS — N528 Other male erectile dysfunction: Secondary | ICD-10-CM | POA: Diagnosis not present

## 2022-03-06 DIAGNOSIS — N430 Encysted hydrocele: Secondary | ICD-10-CM | POA: Diagnosis not present

## 2022-03-07 ENCOUNTER — Other Ambulatory Visit: Payer: Self-pay | Admitting: Urology

## 2022-03-07 DIAGNOSIS — N433 Hydrocele, unspecified: Secondary | ICD-10-CM

## 2022-03-13 ENCOUNTER — Ambulatory Visit: Payer: 59 | Admitting: Family Medicine

## 2022-03-13 ENCOUNTER — Ambulatory Visit (HOSPITAL_BASED_OUTPATIENT_CLINIC_OR_DEPARTMENT_OTHER)
Admission: RE | Admit: 2022-03-13 | Discharge: 2022-03-13 | Disposition: A | Discharge status: Home / Self Care | Payer: 59 | Source: Ambulatory Visit | Attending: Family Medicine | Admitting: Family Medicine

## 2022-03-13 ENCOUNTER — Telehealth: Payer: Self-pay | Admitting: Family Medicine

## 2022-03-13 ENCOUNTER — Encounter: Payer: Self-pay | Admitting: Family Medicine

## 2022-03-13 VITALS — BP 136/84 | HR 66 | Temp 97.9°F | Wt 229.6 lb

## 2022-03-13 DIAGNOSIS — J441 Chronic obstructive pulmonary disease with (acute) exacerbation: Secondary | ICD-10-CM

## 2022-03-13 DIAGNOSIS — R059 Cough, unspecified: Secondary | ICD-10-CM

## 2022-03-13 LAB — POCT INFLUENZA A/B
Influenza A, POC: NEGATIVE
Influenza B, POC: NEGATIVE

## 2022-03-13 LAB — POC COVID19 BINAXNOW: SARS Coronavirus 2 Ag: NEGATIVE

## 2022-03-13 MED ORDER — PREDNISONE 20 MG PO TABS: ORAL_TABLET | ORAL | 0 refills | Status: DC

## 2022-03-13 MED ORDER — METHYLPREDNISOLONE ACETATE 80 MG/ML IJ SUSP
80.0000 mg | Freq: Once | INTRAMUSCULAR | Status: AC
  Administered 2022-03-13: 80 mg via INTRAMUSCULAR

## 2022-03-13 MED ORDER — IPRATROPIUM-ALBUTEROL 0.5-2.5 (3) MG/3ML IN SOLN
3.0000 mL | Freq: Once | RESPIRATORY_TRACT | Status: AC
  Administered 2022-03-13 (×2): 3 mL via RESPIRATORY_TRACT

## 2022-03-13 MED ORDER — AZITHROMYCIN 250 MG PO TABS: ORAL_TABLET | ORAL | 0 refills | Status: AC

## 2022-03-13 NOTE — Telephone Encounter (Signed)
These inform patient: His  x-ray results are negative for pneumonia. Continue treatment for bronchitis/COPD exacerbation as we discussed during his appointment.  Follow-up in 1-2 weeks for recheck

## 2022-03-13 NOTE — Patient Instructions (Signed)

## 2022-03-13 NOTE — Telephone Encounter (Signed)
noted 

## 2022-03-13 NOTE — Telephone Encounter (Signed)
Attempted to contact pt and was unable to LVM 

## 2022-03-13 NOTE — Progress Notes (Signed)
Todd Mercado , 03/21/1961, 61 y.o., male MRN: [id number] Patient Care Team    Relationship Specialty Notifications Start End  Natalia Leatherwood, DO PCP - General Family Medicine  07/02/15   Frederico Hamman, MD Consulting Physician Orthopedic Surgery  09/04/17     Chief Complaint  Patient presents with   Cough    1 week; green sputum; gf has bronchitis      Subjective: Pt presents for an OV with complaints of cough- productive, feeling winded.symptoms started about 6 days ago PMH COPD and smoker. Sig other with bronchitis.  Reports compliance with his Breo inhaler     06/13/2021    8:48 AM 06/21/2020   10:55 AM 01/27/2020   10:43 AM 06/21/2018   12:27 PM 09/01/2017    3:23 PM  Depression screen PHQ 2/9  Decreased Interest 0 0 0 0 0  Down, Depressed, Hopeless 0 0 0 0 0  PHQ - 2 Score 0 0 0 0 0  Altered sleeping   3    Tired, decreased energy   2    Change in appetite   0    Feeling bad or failure about yourself    0    Trouble concentrating   0    Moving slowly or fidgety/restless   0    Suicidal thoughts   0    PHQ-9 Score   5      No Known Allergies Social History   Social History Narrative   [name] is divorced. He has 3 children (D[place]ton, Huntley Dec and [place])   HS education, employed as a Media planner, operates heavy machinery   Heavy tobacco use > 45 pack year history, started smoking at age 89.    Alcohol use. No drug use.    Wears his seatbelt, smoke detector in the home.   wears dentures   Feels safe in relationships.    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   Wears dentures    Past  Surgical History:  Procedure Laterality Date   DENTAL SURGERY     TOTAL KNEE ARTHROPLASTY Right 03/07/2016   Procedure: TOTAL KNEE ARTHROPLASTY;  Surgeon: Frederico Hamman, MD;  Location: [facility] OR;  Service: Orthopedics;  Laterality: Right;   Family History  Problem Relation Age of Onset   Hypertension Mother    COPD Mother    Arthritis Mother    Heart disease Mother        atrial fibrillation   Atrial fibrillation Mother    Emphysema Father    Early death Father    Arthritis Maternal Aunt    Diabetes Maternal Aunt    COPD Maternal Uncle    Heart failure Maternal Grandfather    Allergies as of 03/13/2022   No Known Allergies      Medication List        Accurate as of March 13, 2022  2:43 PM. If you have any questions, ask your nurse or doctor.          albuterol 108 (90 Base)  MCG/ACT inhaler Commonly known as: VENTOLIN HFA Use as directed   amLODipine 10 MG tablet Commonly known as: NORVASC Take 1 tablet (10 mg total) by mouth daily.   azithromycin 250 MG tablet Commonly known as: ZITHROMAX Take 2 tablets on day 1, then 1 tablet daily on days 2 through 5 Started by: Felix Pacini, DO   fluticasone furoate-vilanterol 200-25 MCG/ACT Aepb Commonly known as: BREO ELLIPTA Inhale 1 puff into the lungs daily.   predniSONE 20 MG tablet Commonly known as: DELTASONE 60 mg x3d, 40 mg x3d, 20 mg x2d, 10 mg x2d Started by: Felix Pacini, DO   sildenafil 50 MG tablet Commonly known as: Viagra Take 1 tablet (50 mg total) by mouth daily as needed for erectile dysfunction.        All past medical history, surgical history, allergies, family history, immunizations andmedications were updated in the EMR today and reviewed under the history and medication portions of their EMR.     Review of Systems  Constitutional:  Positive for chills, fever and malaise/fatigue.  HENT:  Positive for congestion.   Respiratory:  Positive for cough, sputum production and shortness of  breath.    Negative, with the exception of above mentioned in HPI   Objective:  BP 136/84   Pulse 66   Temp 97.9 F (36.6 C)   Wt 229 lb 9.6 oz (104.1 kg)   SpO2 (!) 87%   BMI 31.14 kg/m  Body mass index is 31.14 kg/m. Physical Exam Vitals and nursing note reviewed.  Constitutional:      General: He is not in acute distress.    Appearance: Normal appearance. He is not ill-appearing, toxic-appearing or diaphoretic.  HENT:     Head: Normocephalic and atraumatic.     Right Ear: Tympanic membrane normal.     Left Ear: Tympanic membrane normal.     Nose: Congestion present.     Mouth/Throat:     Pharynx: No oropharyngeal exudate or posterior oropharyngeal erythema.  Eyes:     General: No scleral icterus.       Right eye: No discharge.        Left eye: No discharge.     Extraocular Movements: Extraocular movements intact.     Pupils: Pupils are equal, round, and reactive to light.  Cardiovascular:     Rate and Rhythm: Normal rate and regular rhythm.     Heart sounds: No murmur heard. Pulmonary:     Effort: Accessory muscle usage present.     Breath sounds: Decreased air movement present. Examination of the right-upper field reveals decreased breath sounds. Examination of the left-upper field reveals decreased breath sounds. Examination of the right-middle field reveals decreased breath sounds. Examination of the left-middle field reveals decreased breath sounds. Examination of the right-lower field reveals decreased breath sounds. Examination of the left-lower field reveals decreased breath sounds. Decreased breath sounds present.  Musculoskeletal:     Right lower leg: No edema.     Left lower leg: No edema.  Skin:    General: Skin is warm and dry.     Coloration: Skin is not jaundiced or pale.     Findings: No rash.  Neurological:     Mental Status: He is alert and oriented to person, place, and time. Mental status is at baseline.  Psychiatric:        Mood and Affect:  Mood normal.        Behavior: Behavior normal.        Thought  Content: Thought content normal.        Judgment: Judgment normal.     No results found. DG Chest 2 View  Result Date: 03/13/2022 CLINICAL DATA:  Two-week history of productive cough EXAM: CHEST - 2 VIEW COMPARISON:  Chest radiograph dated 06/21/2020 FINDINGS: Normal lung volumes. No focal consolidations. No pleural effusion or pneumothorax. The heart size and mediastinal contours are within normal limits. The visualized skeletal structures are unremarkable. IMPRESSION: No active cardiopulmonary disease. Electronically Signed   By: Agustin Cree M.D.   On: 03/13/2022 11:57   Results for orders placed or performed in visit on 03/13/22 (from the past 24 hour(s))  POCT Influenza A/B     Status: None   Collection Time: 03/13/22 11:03 AM  Result Value Ref Range   Influenza A, POC Negative Negative   Influenza B, POC Negative Negative  POC COVID-19 BinaxNow     Status: None   Collection Time: 03/13/22 11:03 AM  Result Value Ref Range   SARS Coronavirus 2 Ag Negative Negative    Assessment/Plan: Wyndham Santilli is a 61 y.o. male present for OV for  Cough, unspecified type/COPD with acute exacerbation (HCC) - O2 sat 86% on arrival > duoneb tx > 87%> duoneb tx> 95% diffuse rhonchi and rales - Azith and predispose taper prescribed. Can not rule out pna> xray ordered> if pna present would add dual coverage with omnicef at that time. - POCT Influenza A/B> negative - POC COVID-19 BinaxNow> negative - albuterol neb at home every 4 hours today, then every 6 hr tomorrow and every 8 hour following day- then prn.  - albuterol inhaler PRN - ipratropium-albuterol (DUONEB) 0.5-2.5 (3) MG/3ML nebulizer solution 3 mLx2 F/u dependent upon xray results and clinical outcome, likely 1-2 weeks depending upon results. Sooner if worsening.    Reviewed expectations re: course of current medical issues. Discussed self-management of symptoms. Outlined signs  and symptoms indicating need for more acute intervention. Patient verbalized understanding and all questions were answered. Patient received an After-Visit Summary.    Orders Placed This Encounter  Procedures   DG Chest 2 View   POCT Influenza A/B   POC COVID-19 BinaxNow   Meds ordered this encounter  Medications   ipratropium-albuterol (DUONEB) 0.5-2.5 (3) MG/3ML nebulizer solution 3 mL   predniSONE (DELTASONE) 20 MG tablet    Sig: 60 mg x3d, 40 mg x3d, 20 mg x2d, 10 mg x2d    Dispense:  18 tablet    Refill:  0   azithromycin (ZITHROMAX) 250 MG tablet    Sig: Take 2 tablets on day 1, then 1 tablet daily on days 2 through 5    Dispense:  6 tablet    Refill:  0   methylPREDNISolone acetate (DEPO-MEDROL) injection 80 mg   Referral Orders  No referral(s) requested today   45 Minutes was dedicated to this patient's encounter to include face-to-face time with patient, multiple breathing treatments required to stabilize oxygen levels, imaging  and post-visit work- which include documentation and prescribing medications and/or ordering test when necessary.     Note is dictated utilizing voice recognition software. Although note has been proof read prior to signing, occasional typographical errors still can be missed. If any questions arise, please do not hesitate to call for verification.   electronically signed by:  Felix Pacini, DO  [facility] [place]

## 2022-03-13 NOTE — Telephone Encounter (Signed)
Patient returned call regarding xray results. He is aware of results per Dr. Alan Ripper orders.  Copied and pasted below: "His  x-ray results are negative for pneumonia. Continue treatment for bronchitis/COPD exacerbation as we discussed during his appointment.  Follow-up in 1-2 weeks for recheck."  No other questions or concerns.  He will call back to schedule follow up appt.

## 2022-03-21 ENCOUNTER — Other Ambulatory Visit: Payer: 59

## 2022-04-28 DIAGNOSIS — H5213 Myopia, bilateral: Secondary | ICD-10-CM | POA: Diagnosis not present

## 2022-05-14 ENCOUNTER — Telehealth (INDEPENDENT_AMBULATORY_CARE_PROVIDER_SITE_OTHER): Payer: Medicaid Other | Admitting: Family Medicine

## 2022-05-14 ENCOUNTER — Encounter: Payer: Self-pay | Admitting: Family Medicine

## 2022-05-14 DIAGNOSIS — U071 COVID-19: Secondary | ICD-10-CM

## 2022-05-14 MED ORDER — PREDNISONE 20 MG PO TABS: ORAL_TABLET | ORAL | 0 refills | Status: DC

## 2022-05-14 MED ORDER — AZITHROMYCIN 250 MG PO TABS: ORAL_TABLET | ORAL | 0 refills | Status: AC

## 2022-05-14 MED ORDER — MOLNUPIRAVIR EUA 200MG CAPSULE: 4.0000 | ORAL_CAPSULE | Freq: Two times a day (BID) | ORAL | 0 refills | Status: AC

## 2022-05-14 NOTE — Progress Notes (Signed)
VIRTUAL VISIT VIA VIDEO  I connected with Todd Mercado on 05/14/22 at  3:40 PM EST by a video enabled telemedicine application and verified that I am speaking with the correct person using two identifiers. Location patient: Home Location provider: [facility], Office Persons participating in the virtual visit: Patient, Dr. Raoul Pitch and Cyndra Numbers, CMA  I discussed the limitations of evaluation and management by telemedicine and the availability of in person appointments. The patient expressed understanding and agreed to proceed.   Todd Mercado , 05-03-1961, 62 y.o., male MRN: [id number] Patient Care Team    Relationship Specialty Notifications Start End  Ma Hillock, DO PCP - General Family Medicine  07/02/15   Earlie Server, MD Consulting Physician Orthopedic Surgery  09/04/17     Chief Complaint  Patient presents with   Covid Positive    Pt c/o chills, chest tightness, chest congestion, cough x 1 day; tested pos yesterday     Subjective: Pt presents for an OV with complaints of cold, fever, chest tightness, congestion and a cough that started yesterday.  He has a history of COPD.  He is compliant with inhalers.  He frequently requires steroids with illnesses.  He tested positive for COVID yesterday.  His mother is in the hospital with COVID-pneumonia.  Covid 19 positive Oxygen 94%.  Headache present.       05/14/2022    3:33 PM 06/13/2021    8:48 AM 06/21/2020   10:55 AM 01/27/2020   10:43 AM 06/21/2018   12:27 PM  Depression screen PHQ 2/9  Decreased Interest 0 0 0 0 0  Down, Depressed, Hopeless 0 0 0 0 0  PHQ - 2 Score 0 0 0 0 0  Altered sleeping    3   Tired, decreased energy    2   Change in appetite    0   Feeling bad or failure about yourself     0   Trouble concentrating    0   Moving slowly or fidgety/restless    0   Suicidal thoughts    0   PHQ-9 Score    5     No Known Allergies Social History   Social History Narrative   Mr. Sahlin is  divorced. He has 3 children (D[place]ton, Clarise Cruz and [place])   HS education, employed as a Psychiatrist, operates heavy machinery   Heavy tobacco use > 45 pack year history, started smoking at age 31.    Alcohol use. No drug use.    Wears his seatbelt, smoke detector in the home.   wears dentures   Feels safe in relationships.    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   Wears dentures    Past Surgical History:  Procedure Laterality Date   DENTAL SURGERY     TOTAL KNEE ARTHROPLASTY Right 03/07/2016   Procedure: TOTAL KNEE ARTHROPLASTY;  Surgeon: Earlie Server, MD;  Location: [place];  Service: Orthopedics;  Laterality: Right;   Family History  Problem Relation Age of Onset  Hypertension Mother    COPD Mother    Arthritis Mother    Heart disease Mother        atrial fibrillation   Atrial fibrillation Mother    Emphysema Father    Early death Father    Arthritis Maternal Aunt    Diabetes Maternal Aunt    COPD Maternal Uncle    Heart failure Maternal Grandfather    Allergies as of 05/14/2022   No Known Allergies      Medication List        Accurate as of May 14, 2022  5:23 PM. If you have any questions, ask your nurse or doctor.          albuterol 108 (90 Base) MCG/ACT inhaler Commonly known as: VENTOLIN HFA Use as directed   amLODipine 10 MG tablet Commonly known as: NORVASC Take 1 tablet (10 mg total) by mouth daily.   azithromycin 250 MG tablet Commonly known as: ZITHROMAX Take 2 tablets on day 1, then 1 tablet daily on days 2 through 5 Started by: Howard Pouch, DO   fluticasone  furoate-vilanterol 200-25 MCG/ACT Aepb Commonly known as: BREO ELLIPTA Inhale 1 puff into the lungs daily.   molnupiravir EUA 200 mg Caps capsule Commonly known as: LAGEVRIO Take 4 capsules (800 mg total) by mouth 2 (two) times daily for 5 days. Started by: Howard Pouch, DO   predniSONE 20 MG tablet Commonly known as: DELTASONE 60 mg x3d, 40 mg x3d, 20 mg x2d, 10 mg x2d   sildenafil 50 MG tablet Commonly known as: Viagra Take 1 tablet (50 mg total) by mouth daily as needed for erectile dysfunction.        All past medical history, surgical history, allergies, family history, immunizations andmedications were updated in the EMR today and reviewed under the history and medication portions of their EMR.     ROS Negative, with the exception of above mentioned in HPI   Objective:  There were no vitals taken for this visit. There is no height or weight on file to calculate BMI. Physical Exam Vitals and nursing note reviewed.  Constitutional:      General: He is not in acute distress.    Appearance: Normal appearance. He is not ill-appearing, toxic-appearing or diaphoretic.  HENT:     Head: Normocephalic and atraumatic.  Eyes:     General: No scleral icterus.       Right eye: No discharge.        Left eye: No discharge.     Extraocular Movements: Extraocular movements intact.     Pupils: Pupils are equal, round, and reactive to light.  Pulmonary:     Effort: Pulmonary effort is normal.  Skin:    General: Skin is warm and dry.     Coloration: Skin is not jaundiced or pale.     Findings: No rash.  Neurological:     Mental Status: He is alert and oriented to person, place, and time. Mental status is at baseline.  Psychiatric:        Mood and Affect: Mood normal.        Behavior: Behavior normal.        Thought Content: Thought content normal.        Judgment: Judgment normal.     No results found. No results found. No results found for this or any previous visit  (from the past 24 hour(s)).  Assessment/Plan: Todd Mercado is a 62 y.o. male present for OV for  COVID-19 in COPD patient: Rest, hydrate.  +/- flonase, mucinex (DM if cough), nettie pot or nasal saline.  Patient is at high risk for complications to COVID-19. Compliance with inhalers stressed. Molnupiravir, Z-Pak and prednisone prescribed. F/U 2 weeks if not improved.  Reviewed home care instructions for COVID. Advised self-isolation at home for at least 5 days. After 5 days, if improved and fever resolved, can be in public, but should wear a mask around others for an additional 5 days. If symptoms, esp, dyspnea develops/worsens, recommend in-person evaluation at either an urgent care or the emergency room.  Reviewed expectations re: course of current medical issues. Discussed self-management of symptoms. Outlined signs and symptoms indicating need for more acute intervention. Patient verbalized understanding and all questions were answered. Patient received an After-Visit Summary.    No orders of the defined types were placed in this encounter.  Meds ordered this encounter  Medications   predniSONE (DELTASONE) 20 MG tablet    Sig: 60 mg x3d, 40 mg x3d, 20 mg x2d, 10 mg x2d    Dispense:  18 tablet    Refill:  0   azithromycin (ZITHROMAX) 250 MG tablet    Sig: Take 2 tablets on day 1, then 1 tablet daily on days 2 through 5    Dispense:  6 tablet    Refill:  0   molnupiravir EUA (LAGEVRIO) 200 mg CAPS capsule    Sig: Take 4 capsules (800 mg total) by mouth 2 (two) times daily for 5 days.    Dispense:  40 capsule    Refill:  0   Referral Orders  No referral(s) requested today     Note is dictated utilizing voice recognition software. Although note has been proof read prior to signing, occasional typographical errors still can be missed. If any questions arise, please do not hesitate to call for verification.   electronically signed by:  Felix Pacini, DO  [facility] - [place]

## 2022-05-19 ENCOUNTER — Other Ambulatory Visit (HOSPITAL_COMMUNITY): Payer: Self-pay

## 2022-05-23 ENCOUNTER — Other Ambulatory Visit (HOSPITAL_COMMUNITY): Payer: Self-pay

## 2022-05-23 ENCOUNTER — Ambulatory Visit: Payer: 59 | Admitting: Family Medicine

## 2022-05-29 ENCOUNTER — Telehealth: Payer: Self-pay

## 2022-05-29 NOTE — Telephone Encounter (Signed)
Provider needs to be informed of his formulary alternatives before being able to call in a different inhaler

## 2022-05-29 NOTE — Telephone Encounter (Signed)
Received fax requesting alternative for breo inhaler. Called pt to confirm he would like to have an alternative medication. Please advise on alternative inhaler.  [facility] [place]

## 2022-05-30 ENCOUNTER — Ambulatory Visit (INDEPENDENT_AMBULATORY_CARE_PROVIDER_SITE_OTHER): Payer: Medicaid Other | Admitting: Family Medicine

## 2022-05-30 ENCOUNTER — Encounter: Payer: Self-pay | Admitting: Family Medicine

## 2022-05-30 DIAGNOSIS — I10 Essential (primary) hypertension: Secondary | ICD-10-CM

## 2022-05-30 MED ORDER — AMLODIPINE BESYLATE 10 MG PO TABS: 10.0000 mg | ORAL_TABLET | Freq: Every day | ORAL | 1 refills | Status: DC

## 2022-05-30 MED ORDER — FLUTICASONE FUROATE-VILANTEROL 200-25 MCG/ACT IN AEPB: 1.0000 | INHALATION_SPRAY | Freq: Every day | RESPIRATORY_TRACT | 1 refills | Status: DC

## 2022-05-30 MED ORDER — SILDENAFIL CITRATE 50 MG PO TABS: 50.0000 mg | ORAL_TABLET | Freq: Every day | ORAL | 5 refills | Status: DC | PRN

## 2022-05-30 NOTE — Patient Instructions (Signed)
No follow-ups on file.        Great to see you today.  I have refilled the medication(s) we provide.   If labs were collected, we will inform you of lab results once received either by echart message or telephone call.   - echart message- for normal results that have been seen by the patient already.   - telephone call: abnormal results or if patient has not viewed results in their echart.  

## 2022-05-30 NOTE — Progress Notes (Signed)
Patient ID: Todd Mercado, male  DOB: 26-Aug-1960, 62 y.o.   MRN: [id number] Patient Care Team    Relationship Specialty Notifications Start End  Natalia Leatherwood, DO PCP - General Family Medicine  07/02/15   Frederico Hamman, MD Consulting Physician Orthopedic Surgery  09/04/17     Chief Complaint  Patient presents with   Hypertension    Subjective: Todd Mercado is a 62 y.o. male present for [facility] All past medical history, surgical history, allergies, family history, immunizations, medications and social history were updated in the electronic medical record today. All recent labs, ED visits and hospitalizations within the last year were reviewed. Hypertension/tobacco use: Pt reports compliance with amlodipine 10 mg daily.  Patient denies chest pain, shortness of breath, dizziness or lower extremity edema.  Pt does not take a daily baby ASA. Pt is not prescribed statin.  He continues to smoke daily about 1 cig. A day.  Diet: No routine diet Exercise: No routine exercise RF: Hypertension, overweight, smoker, family history present  OSA/pulmonary emphysema: Patient reports he is compliance with his CPAP.   He is compliant with his Breo. Needs PA now that he has new insurance.  He recovered from covid without complication  Erectile dysfunction: Patient reports he has had erectile dysfunction for at least 2 to 3 years.  He reports he has quit smoking and he has quit drinking.  However alcohol cessation did not improve function.  He is compliant with his CPAP.  He reports he is able to have an erection, he is having difficulty sustaining erection. Viagra is working well and he reports it works good for him without side effects.      05/14/2022    3:33 PM 06/13/2021    8:48 AM 06/21/2020   10:55 AM 01/27/2020   10:43 AM 06/21/2018   12:27 PM  Depression screen PHQ 2/9  Decreased Interest 0 0 0 0 0  Down, Depressed, Hopeless 0 0 0 0 0  PHQ - 2 Score 0 0 0 0 0  Altered sleeping    3    Tired, decreased energy    2   Change in appetite    0   Feeling bad or failure about yourself     0   Trouble concentrating    0   Moving slowly or fidgety/restless    0   Suicidal thoughts    0   PHQ-9 Score    5       01/27/2020   10:44 AM 12/14/2018    1:05 PM  GAD 7 : Generalized Anxiety Score  Nervous, Anxious, on Edge 2 1  Control/stop worrying 0 1  Worry too much - different things 0 0  Trouble relaxing 2 2  Restless 0 0  Easily annoyed or irritable 2 1  Afraid - awful might happen 0 0  Total GAD 7 Score 6 5  Anxiety Difficulty  Somewhat difficult           06/21/2018   12:27 PM  Fall Risk   Falls in the past year? 0  Number falls in past yr: 0  Injury with Fall? 0  Follow up Falls evaluation completed    Immunization History  Administered Date(s) Administered   Influenza,inj,Quad PF,6+ Mos 02/11/2013, 01/23/2016, 03/22/2018, 06/10/2019, 01/27/2020   Influenza-Unspecified 04/29/2021, 02/02/2022   PFIZER(Purple Top)SARS-COV-2 Vaccination 08/08/2019, 09/04/2019   Pfizer Covid-19 Vaccine Bivalent Booster 35yrs & up 04/29/2021   Pneumococcal Polysaccharide-23 02/11/2013  Past Medical History:  Diagnosis Date   Allergy    Anxiety 06/21/2018   Arthritis    Cigar smoker motivated to quit    Community acquired pneumonia of right lung 06/21/2020   COPD (chronic obstructive pulmonary disease) ([place])    Current every day smoker 06/02/2019   Hypertension    Hypoxia 06/09/2020   OSA (obstructive sleep apnea) 2004   CPAP recommended- to meet with home health 02/26/16   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   Wears dentures    No Known Allergies Past Surgical History:  Procedure Laterality Date   DENTAL SURGERY     TOTAL KNEE ARTHROPLASTY Right 03/07/2016   Procedure: TOTAL KNEE ARTHROPLASTY;  Surgeon: Earlie Server, MD;  Location: [place];  Service: Orthopedics;  Laterality: Right;   Family History  Problem Relation Age of Onset   Hypertension Mother    COPD Mother     Arthritis Mother    Heart disease Mother        atrial fibrillation   Atrial fibrillation Mother    Emphysema Father    Early death Father    Arthritis Maternal Aunt    Diabetes Maternal Aunt    COPD Maternal Uncle    Heart failure Maternal Grandfather    Social History   Social History Narrative   Todd Mercado is divorced. He has 3 children ([place], Clarise Cruz and [place])   HS education, employed as a Psychiatrist, operates heavy machinery   Heavy tobacco use > 45 pack year history, started smoking at age 81.    Alcohol use. No drug use.    Wears his seatbelt, smoke detector in the home.   wears dentures   Feels safe in relationships.     Allergies as of 05/30/2022   No Known Allergies      Medication List        Accurate as of May 30, 2022 11:02 AM. If you have any questions, ask your nurse or doctor.          STOP taking these medications    predniSONE 20 MG tablet Commonly known as: DELTASONE Stopped by: Howard Pouch, DO       TAKE these medications    albuterol 108 (90 Base) MCG/ACT inhaler Commonly known as: VENTOLIN HFA Use as directed   amLODipine 10 MG tablet Commonly known as: NORVASC Take 1 tablet (10 mg total) by mouth daily.   fluticasone furoate-vilanterol 200-25 MCG/ACT Aepb Commonly known as: BREO ELLIPTA Inhale 1 puff into the lungs daily.   sildenafil 50 MG tablet Commonly known as: Viagra Take 1 tablet (50 mg total) by mouth daily as needed for erectile dysfunction.       All past medical history, surgical history, allergies, family history, immunizations andmedications were updated in the EMR today and reviewed under the history and medication portions of their EMR.      ROS 14 pt review of systems performed and negative (unless mentioned in an HPI)  Objective: BP 137/74   Pulse 71   Temp 98.3 F (36.8 C)   Wt 233 lb 9.6 oz (106 kg)   SpO2 92%   BMI 31.68 kg/m  Physical Exam Vitals and nursing note reviewed.   Constitutional:      General: He is not in acute distress.    Appearance: Normal appearance. He is not ill-appearing, toxic-appearing or diaphoretic.  HENT:     Head: Normocephalic and atraumatic.  Eyes:     General: No scleral icterus.  Right eye: No discharge.        Left eye: No discharge.     Extraocular Movements: Extraocular movements intact.     Pupils: Pupils are equal, round, and reactive to light.  Cardiovascular:     Rate and Rhythm: Normal rate and regular rhythm.  Pulmonary:     Effort: Pulmonary effort is normal. No respiratory distress.     Breath sounds: Normal breath sounds. No wheezing, rhonchi or rales.  Musculoskeletal:     Right lower leg: No edema.     Left lower leg: No edema.  Skin:    General: Skin is warm.     Findings: No rash.  Neurological:     Mental Status: He is alert and oriented to person, place, and time. Mental status is at baseline.  Psychiatric:        Mood and Affect: Mood normal.        Behavior: Behavior normal.        Thought Content: Thought content normal.        Judgment: Judgment normal.     No results found.  Assessment/plan: Todd Mercado is a 62 y.o. male present for [facility] Essential hypertension, benign/Morbid obesity (HCC) Stable Continue  amlodipine 10 mg QD . tried meds:  lisinopril- cough.  Low sodium Diet, exercise    OSA (obstructive sleep apnea)/Pulmonary emphysema, unspecified emphysema type (HCC)/wheezing stable Continue Breo.  - continue  albuterol as needed -Compliant with CPAP   Erectile dysfunction: Discussed proper use of sildenafil/Viagra like product with him today. Discussed emergent precautions if dizziness or chest pain is experienced with use. Continue  Viagra 50 mg daily as needed    Return in about 24 weeks (around 11/14/2022) for Routine chronic condition follow-up.   No orders of the defined types were placed in this encounter.  Meds ordered this encounter  Medications    amLODipine (NORVASC) 10 MG tablet    Sig: Take 1 tablet (10 mg total) by mouth daily.    Dispense:  90 tablet    Refill:  1   fluticasone furoate-vilanterol (BREO ELLIPTA) 200-25 MCG/ACT AEPB    Sig: Inhale 1 puff into the lungs daily.    Dispense:  180 each    Refill:  1   sildenafil (VIAGRA) 50 MG tablet    Sig: Take 1 tablet (50 mg total) by mouth daily as needed for erectile dysfunction.    Dispense:  10 tablet    Refill:  5    May dispense in 90d if insurance allows. W/ 1 refll   Referral Orders  No referral(s) requested today     Note is dictated utilizing voice recognition software. Although note has been proof read prior to signing, occasional typographical errors still can be missed. If any questions arise, please do not hesitate to call for verification.  Electronically signed by: Howard Pouch, DO [place]

## 2022-06-26 ENCOUNTER — Telehealth: Payer: Self-pay

## 2022-06-27 ENCOUNTER — Other Ambulatory Visit (HOSPITAL_COMMUNITY): Payer: Self-pay

## 2022-06-27 NOTE — Telephone Encounter (Signed)
Pharmacy Patient Advocate Encounter   Received notification that prior authorization for Fluticasone Furoate-Vilanterol 200-25MCG/ACT aerosol powder is required/requested.  Per Test Claim: Product/Service not covered - Plan/Benefit exclusion   PA submitted on 06/27/22 to (ins) [employer] via CoverMyMeds [facility] Status is pending

## 2022-06-30 NOTE — Telephone Encounter (Signed)
Pharmacy Patient Advocate Encounter  Received notification from [facility] that the request for prior authorization for Fluticasone Furoate-Vilanterol 200-25MCG/ACT aerosol powder has been denied due to not meeting requirements.      Please be advised we currently do not have a Pharmacist to review denials, therefore you will need to process appeals accordingly as needed. Thanks for your support at this time.   You may call [phone] or fax [phone], to appeal.

## 2022-07-04 ENCOUNTER — Other Ambulatory Visit (HOSPITAL_COMMUNITY): Payer: Self-pay

## 2022-09-28 ENCOUNTER — Other Ambulatory Visit: Payer: Self-pay | Admitting: Family Medicine

## 2022-09-28 DIAGNOSIS — I10 Essential (primary) hypertension: Secondary | ICD-10-CM

## 2022-10-07 ENCOUNTER — Other Ambulatory Visit: Payer: Self-pay

## 2022-10-26 ENCOUNTER — Other Ambulatory Visit: Payer: Self-pay | Admitting: Family Medicine

## 2022-10-26 DIAGNOSIS — I10 Essential (primary) hypertension: Secondary | ICD-10-CM

## 2022-10-31 ENCOUNTER — Telehealth: Payer: Self-pay

## 2022-10-31 DIAGNOSIS — J439 Emphysema, unspecified: Secondary | ICD-10-CM

## 2022-10-31 MED ORDER — ALBUTEROL SULFATE HFA 108 (90 BASE) MCG/ACT IN AERS: INHALATION_SPRAY | RESPIRATORY_TRACT | 0 refills | Status: DC

## 2022-10-31 MED ORDER — FLUTICASONE FUROATE-VILANTEROL 200-25 MCG/ACT IN AEPB: 1.0000 | INHALATION_SPRAY | Freq: Every day | RESPIRATORY_TRACT | 0 refills | Status: DC

## 2022-10-31 NOTE — Telephone Encounter (Signed)
Prescription Request  10/31/2022  LOV: Visit date not found  What is the name of the medication or equipment? fluticasone furoate-vilanterol (BREO ELLIPTA) 200-25 MCG/ACT AEPB    albuterol (VENTOLIN HFA) 108 (90 Base) MCG/ACT inhaler     Have you contacted your pharmacy to request a refill? Yes  --- was told to contact PCP  Patient has appt with Dr. Claiborne Billings on 7/12 - will be out of meds on Monday 7/1  Which pharmacy would you like this sent to?  [id number] [place], [place] - [address] [place] [address] Phone: [phone] Fax: [phone]    Patient notified that their request is being sent to the clinical staff for review and that they should receive a response within 2 business days.   Please advise at Mobile [phone] (mobile)

## 2022-10-31 NOTE — Telephone Encounter (Signed)
Rx sent 

## 2022-11-04 DIAGNOSIS — N529 Male erectile dysfunction, unspecified: Secondary | ICD-10-CM | POA: Diagnosis not present

## 2022-11-04 DIAGNOSIS — Z7951 Long term (current) use of inhaled steroids: Secondary | ICD-10-CM | POA: Diagnosis not present

## 2022-11-04 DIAGNOSIS — Z6831 Body mass index (BMI) 31.0-31.9, adult: Secondary | ICD-10-CM | POA: Diagnosis not present

## 2022-11-04 DIAGNOSIS — Z8249 Family history of ischemic heart disease and other diseases of the circulatory system: Secondary | ICD-10-CM | POA: Diagnosis not present

## 2022-11-04 DIAGNOSIS — J449 Chronic obstructive pulmonary disease, unspecified: Secondary | ICD-10-CM | POA: Diagnosis not present

## 2022-11-04 DIAGNOSIS — Z87891 Personal history of nicotine dependence: Secondary | ICD-10-CM | POA: Diagnosis not present

## 2022-11-04 DIAGNOSIS — I10 Essential (primary) hypertension: Secondary | ICD-10-CM | POA: Diagnosis not present

## 2022-11-04 DIAGNOSIS — Z008 Encounter for other general examination: Secondary | ICD-10-CM | POA: Diagnosis not present

## 2022-11-04 DIAGNOSIS — E669 Obesity, unspecified: Secondary | ICD-10-CM | POA: Diagnosis not present

## 2022-11-14 ENCOUNTER — Ambulatory Visit (INDEPENDENT_AMBULATORY_CARE_PROVIDER_SITE_OTHER): Payer: Medicaid Other | Admitting: Family Medicine

## 2022-11-14 ENCOUNTER — Encounter: Payer: Self-pay | Admitting: Family Medicine

## 2022-11-14 ENCOUNTER — Ambulatory Visit: Payer: Medicaid Other | Admitting: Family Medicine

## 2022-11-14 VITALS — BP 132/72 | HR 67 | Temp 97.8°F | Wt 228.6 lb

## 2022-11-14 DIAGNOSIS — N528 Other male erectile dysfunction: Secondary | ICD-10-CM | POA: Diagnosis not present

## 2022-11-14 DIAGNOSIS — G4733 Obstructive sleep apnea (adult) (pediatric): Secondary | ICD-10-CM

## 2022-11-14 DIAGNOSIS — I10 Essential (primary) hypertension: Secondary | ICD-10-CM | POA: Diagnosis not present

## 2022-11-14 DIAGNOSIS — J439 Emphysema, unspecified: Secondary | ICD-10-CM | POA: Diagnosis not present

## 2022-11-14 DIAGNOSIS — F17200 Nicotine dependence, unspecified, uncomplicated: Secondary | ICD-10-CM

## 2022-11-14 MED ORDER — AMLODIPINE BESYLATE 10 MG PO TABS: 10.0000 mg | ORAL_TABLET | Freq: Every day | ORAL | 1 refills | Status: DC

## 2022-11-14 MED ORDER — ALBUTEROL SULFATE HFA 108 (90 BASE) MCG/ACT IN AERS: INHALATION_SPRAY | RESPIRATORY_TRACT | 11 refills | Status: DC

## 2022-11-14 MED ORDER — IPRATROPIUM-ALBUTEROL 0.5-2.5 (3) MG/3ML IN SOLN
3.0000 mL | RESPIRATORY_TRACT | Status: AC
  Administered 2022-11-14: 3 mL via RESPIRATORY_TRACT

## 2022-11-14 MED ORDER — FLUTICASONE FUROATE-VILANTEROL 200-25 MCG/ACT IN AEPB: 1.0000 | INHALATION_SPRAY | Freq: Every day | RESPIRATORY_TRACT | 3 refills | Status: DC

## 2022-11-14 NOTE — Patient Instructions (Addendum)
Return in about 24 weeks (around 05/01/2023) for Routine chronic condition follow-up.        Great to see you today.  I have refilled the medication(s) we provide.   If labs were collected, we will inform you of lab results once received either by echart message or telephone call.   - echart message- for normal results that have been seen by the patient already.   - telephone call: abnormal results or if patient has not viewed results in their echart.

## 2022-11-14 NOTE — Progress Notes (Signed)
Patient ID: Todd Mercado, male  DOB: 13-Feb-1961, 62 y.o.   MRN: [id number] Patient Care Team    Relationship Specialty Notifications Start End  Natalia Leatherwood, DO PCP - General Family Medicine  07/02/15   Frederico Hamman, MD Consulting Physician Orthopedic Surgery  09/04/17     Chief Complaint  Patient presents with   Hypertension    O2 at highest is 92%; has taken rescue inhaler within the last 15 mins    Subjective: Todd Mercado is a 62 y.o. male present for [facility] All past medical history, surgical history, allergies, family history, immunizations, medications and social history were updated in the electronic medical record today. All recent labs, ED visits and hospitalizations within the last year were reviewed.  Hypertension/tobacco use: Pt reports compliance with amlodipine 10 mg daily.  Patient denies chest pain, shortness of breath, dizziness or lower extremity edema.   Pt does not take a daily baby ASA. Pt is not prescribed statin.  He continues to smoke daily about 1 cig. A day.  Diet: No routine diet Exercise: No routine exercise RF: Hypertension, overweight, smoker, family history present  OSA/pulmonary emphysema: Patient reports he is compliance with his CPAP.   He is compliant with his Breo. Needs PA now that he has new insurance.    Erectile dysfunction: Patient reports he has had erectile dysfunction for at least 2 to 3 years.  He reports he has quit smoking and he has quit drinking.  However alcohol cessation did not improve function.  He is compliant with his CPAP.  He reports he is able to have an erection, he is having difficulty sustaining erection. Viagra is working well and he reports it works good for him without side effects.      11/14/2022    2:05 PM 05/14/2022    3:33 PM 06/13/2021    8:48 AM 06/21/2020   10:55 AM 01/27/2020   10:43 AM  Depression screen PHQ 2/9  Decreased Interest 2 0 0 0 0  Down, Depressed, Hopeless 0 0 0 0 0  PHQ - 2 Score 2 0 0  0 0  Altered sleeping 1    3  Tired, decreased energy 2    2  Change in appetite     0  Feeling bad or failure about yourself  0    0  Trouble concentrating 3    0  Moving slowly or fidgety/restless 0    0  Suicidal thoughts 0    0  PHQ-9 Score 8    5  Difficult doing work/chores Not difficult at all          11/14/2022    2:06 PM 01/27/2020   10:44 AM 12/14/2018    1:05 PM  GAD 7 : Generalized Anxiety Score  Nervous, Anxious, on Edge 0 2 1  Control/stop worrying 0 0 1  Worry too much - different things 0 0 0  Trouble relaxing 1 2 2   Restless 0 0 0  Easily annoyed or irritable 1 2 1   Afraid - awful might happen 0 0 0  Total GAD 7 Score 2 6 5   Anxiety Difficulty Not difficult at all  Somewhat difficult           11/14/2022    2:05 PM 06/21/2018   12:27 PM  Fall Risk   Falls in the past year? 0 0  Number falls in past yr: 0 0  Injury with Fall? 0 0  Follow up  Falls evaluation completed Falls evaluation completed    Immunization History  Administered Date(s) Administered   Influenza,inj,Quad PF,6+ Mos 02/11/2013, 01/23/2016, 03/22/2018, 06/10/2019, 01/27/2020   Influenza-Unspecified 04/29/2021, 02/02/2022   PFIZER(Purple Top)SARS-COV-2 Vaccination 08/08/2019, 09/04/2019   Pfizer Covid-19 Vaccine Bivalent Booster 10yrs & up 04/29/2021   Pneumococcal Polysaccharide-23 02/11/2013   Tdap 02/17/2022   Zoster Recombinant(Shingrix) 02/17/2022     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   Wears dentures    No Known Allergies Past Surgical History:  Procedure Laterality Date   DENTAL SURGERY      TOTAL KNEE ARTHROPLASTY Right 03/07/2016   Procedure: TOTAL KNEE ARTHROPLASTY;  Surgeon: Frederico Hamman, MD;  Location: [facility] OR;  Service: Orthopedics;  Laterality: Right;   Family History  Problem Relation Age of Onset   Hypertension Mother    COPD Mother    Arthritis Mother    Heart disease Mother        atrial fibrillation   Atrial fibrillation Mother    Emphysema Father    Early death Father    Arthritis Maternal Aunt    Diabetes Maternal Aunt    COPD Maternal Uncle    Heart failure Maternal Grandfather    Social History   Social History Narrative   Todd Mercado is divorced. He has 3 children (Dalton, Huntley Dec and Gambier)   HS education, employed as a Media planner, operates heavy machinery   Heavy tobacco use > 45 pack year history, started smoking at age 20.    Alcohol use. No drug use.    Wears his seatbelt, smoke detector in the home.   wears dentures   Feels safe in relationships.     Allergies as of 11/14/2022   No Known Allergies      Medication List        Accurate as of November 14, 2022  4:06 PM. If you have any questions, ask your nurse or doctor.          albuterol 108 (90 Base) MCG/ACT inhaler Commonly known as: VENTOLIN HFA Use as directed   amLODipine 10 MG tablet Commonly known as: NORVASC Take 1 tablet (10 mg total) by mouth daily.   fluticasone furoate-vilanterol 200-25 MCG/ACT Aepb Commonly known as: BREO ELLIPTA Inhale 1 puff into the lungs daily.   sildenafil 50 MG tablet Commonly known as: Viagra Take 1 tablet (50 mg total) by mouth daily as needed for erectile dysfunction.       All past medical history, surgical history, allergies, family history, immunizations andmedications were updated in the EMR today and reviewed under the history and medication portions of their EMR.      ROS 14 pt review of systems performed and negative (unless mentioned in an HPI)  Objective: BP 132/72   Pulse 67   Temp 97.8 F (36.6 C)   Wt 228 lb 9.6 oz  (103.7 kg)   SpO2 92%   BMI 31.00 kg/m  Physical Exam Vitals and nursing note reviewed.  Constitutional:      General: He is not in acute distress.  Appearance: Normal appearance. He is not ill-appearing, toxic-appearing or diaphoretic.  HENT:     Head: Normocephalic and atraumatic.  Eyes:     General: No scleral icterus.       Right eye: No discharge.        Left eye: No discharge.     Extraocular Movements: Extraocular movements intact.     Pupils: Pupils are equal, round, and reactive to light.  Cardiovascular:     Rate and Rhythm: Normal rate and regular rhythm.  Pulmonary:     Effort: Pulmonary effort is normal. No respiratory distress.     Breath sounds: Wheezing present. No rhonchi or rales.     Comments: Diminished breath sounds bilaterally Musculoskeletal:     Right lower leg: No edema.     Left lower leg: No edema.  Skin:    General: Skin is warm.     Findings: No rash.  Neurological:     Mental Status: He is alert and oriented to person, place, and time. Mental status is at baseline.  Psychiatric:        Mood and Affect: Mood normal.        Behavior: Behavior normal.        Thought Content: Thought content normal.        Judgment: Judgment normal.     No results found.  Assessment/plan: Todd Mercado is a 62 y.o. male present for [facility] Essential hypertension, benign/Morbid obesity (HCC) Stable Continue amlodipine 10 mg QD . tried meds:  lisinopril- cough.  Low sodium Diet, exercise As cardiac CT for screening today and he is agreeable for the $99 out-of-pocket screening to be ordered.    OSA (obstructive sleep apnea)/Pulmonary emphysema, unspecified emphysema type (HCC)/wheezing Lungs have significant decrease in air movement today.  DuoNeb treatment x 1 provided with some improvement. We discussed referral back to pulmonology for further evaluation on both his COPD and his OSA.  Patient is agreeable to this today. Continue Breo.  - continue   albuterol as needed -Compliant with CPAP   Erectile dysfunction: Discussed proper use of sildenafil/Viagra like product with him today. Discussed emergent precautions if dizziness or chest pain is experienced with use. Continue Viagra 50 mg daily as needed- he receives this medication off of a online doc    Return in about 24 weeks (around 05/01/2023) for Routine chronic condition follow-up.   Orders Placed This Encounter  Procedures   CT CARDIAC SCORING (SELF PAY ONLY)   Ambulatory referral to Pulmonology   Meds ordered this encounter  Medications   albuterol (VENTOLIN HFA) 108 (90 Base) MCG/ACT inhaler    Sig: Use as directed    Dispense:  8 g    Refill:  11   amLODipine (NORVASC) 10 MG tablet    Sig: Take 1 tablet (10 mg total) by mouth daily.    Dispense:  90 tablet    Refill:  1   fluticasone furoate-vilanterol (BREO ELLIPTA) 200-25 MCG/ACT AEPB    Sig: Inhale 1 puff into the lungs daily.    Dispense:  120 each    Refill:  3   ipratropium-albuterol (DUONEB) 0.5-2.5 (3) MG/3ML nebulizer solution 3 mL   Referral Orders         Ambulatory referral to Pulmonology       Note is dictated utilizing voice recognition software. Although note has been proof read prior to signing, occasional typographical errors still can be missed. If any questions arise, please do not hesitate to call for  verification.  Electronically signed by: Felix Pacini, DO [facility] [place]

## 2022-11-25 ENCOUNTER — Ambulatory Visit (INDEPENDENT_AMBULATORY_CARE_PROVIDER_SITE_OTHER): Payer: Medicaid Other | Admitting: Primary Care

## 2022-11-25 ENCOUNTER — Encounter: Payer: Self-pay | Admitting: Primary Care

## 2022-11-25 VITALS — BP 138/78 | HR 54 | Temp 97.8°F | Ht 72.0 in | Wt 225.0 lb

## 2022-11-25 DIAGNOSIS — G4733 Obstructive sleep apnea (adult) (pediatric): Secondary | ICD-10-CM

## 2022-11-25 NOTE — Progress Notes (Signed)
@Patient  ID: Todd Mercado, male    DOB: October 19, 1960, 62 y.o.   MRN: [id number]  Chief Complaint  Patient presents with   Consult    Dx OSA 2017.  Using CPAP since 2017.  C/o  fatigue daily.  Quit smoking January 2024.    Referring provider: Natalia Leatherwood, DO  HPI: 62 year old male, former smoker.  Past medical history significant for hypertension, OSA, pulmonary emphysema, history of EtOH abuse, morbid obesity.  11/25/2022 Patient presents today for sleep consult.  He had a split-night sleep study on 10 03/2016 that showed severe obstructive sleep apnea, AHI 54.4 an hour with SpO2 low 86%.  Optimal PAP pressure of 12 cm H2O. No significant central apneas or cardiac abnormalities. Severe periodic limb movements of sleep occurred during sleep study.  He remains compliant with CPAP use. His old machine stopped work around 2020-2021. He was able to obtain a friends CPAP machine which he has been using ever since. Current pressure 9cm h20. No issues with mask fit or pressure settings. He is sleeping well. Typical bedtime is between 9 and 11 PM.  It takes him 30 minutes to fall asleep.  He seldomly wakes up during the night.  He starts his day at 4:30 AM.  He sometimes operates heavy machinery.  Weight has remained stable over the last 2 years.  He is currently on CPAP.  He does not wear oxygen.  Epworth score 11.  Airview download 10/26/22-11/24/22 Usage 30/30 days; 93% > 4 hours Average usage 5 hours 41 minutes Pressure 9 cm H2O Air leaks 27.2 L/min (95%) AHI 5.3  No Known Allergies  Immunization History  Administered Date(s) Administered   Influenza,inj,Quad PF,6+ Mos 02/11/2013, 01/23/2016, 03/22/2018, 06/10/2019, 01/27/2020   Influenza-Unspecified 04/29/2021, 02/02/2022   PFIZER(Purple Top)SARS-COV-2 Vaccination 08/08/2019, 09/04/2019   Pfizer Covid-19 Vaccine Bivalent Booster 63yrs & up 04/29/2021   Pneumococcal Polysaccharide-23 02/11/2013   Tdap 02/17/2022   Zoster  Recombinant(Shingrix) 02/17/2022    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  Quit smoking January 2024   Counseling given: Not Answered Tobacco comments: discussed benefits of quitting.  Quit smoking January 2024   Outpatient Medications Prior to Visit  Medication Sig Dispense Refill   albuterol (VENTOLIN HFA) 108 (90 Base) MCG/ACT inhaler Use as directed 8 g 11   amLODipine (NORVASC) 10 MG tablet Take 1 tablet (10 mg total) by mouth daily. 90 tablet 1   fluticasone furoate-vilanterol (BREO ELLIPTA) 200-25 MCG/ACT AEPB Inhale 1 puff into the lungs daily. 120 each 3   sildenafil (VIAGRA) 50 MG tablet Take 1 tablet (50 mg total) by mouth daily as needed for erectile dysfunction. 10 tablet 5   No facility-administered medications prior  to visit.   Review of Systems  Review of Systems  Constitutional: Negative.   Respiratory:  Positive for shortness of breath.   Cardiovascular: Negative.    Physical Exam  BP 138/78 (BP Location: Right Arm, Patient Position: Sitting, Cuff Size: Large)   Pulse (!) 54   Temp 97.8 F (36.6 C) (Oral)   Ht 6' (1.829 m)   Wt 225 lb (102.1 kg)   SpO2 94%   BMI 30.52 kg/m  Physical Exam Constitutional:      Appearance: Normal appearance.  HENT:      Head: Normocephalic and atraumatic.     Mouth/Throat:     Mouth: Mucous membranes are moist.     Pharynx: Oropharynx is clear.  Cardiovascular:     Rate and Rhythm: Normal rate and regular rhythm.  Pulmonary:     Effort: Pulmonary effort is normal.     Breath sounds: Wheezing present.  Skin:    General: Skin is warm and dry.  Neurological:     General: No focal deficit present.     Mental Status: He is alert and oriented to person, place, and time. Mental status is at baseline.  Psychiatric:        Mood and Affect: Mood normal.        Behavior: Behavior normal.        Thought Content: Thought content normal.        Judgment: Judgment normal.      Lab Results:  CBC    Component Value Date/Time   WBC 7.4 12/06/2021 1436   RBC 4.90 12/06/2021 1436   HGB 15.2 12/06/2021 1436   HCT 44.2 12/06/2021 1436   HCT 44 12/25/2011 0000   PLT 156 12/06/2021 1436   MCV 90.2 12/06/2021 1436   MCH 31.0 12/06/2021 1436   MCHC 34.4 12/06/2021 1436   RDW 13.7 12/06/2021 1436   RDW 13.7 12/25/2011 0000   LYMPHSABS 1,303 10/03/2020 1601   MONOABS 0.5 02/25/2016 1452   EOSABS 140 10/03/2020 1601   EOSABS 0 12/25/2011 0000   BASOSABS 70 10/03/2020 1601    BMET    Component Value Date/Time   NA 137 12/06/2021 1436   NA 138 12/25/2011 0000   K 4.0 12/06/2021 1436   K 4.7 12/25/2011 0000   CL 97 (L) 12/06/2021 1436   CL 99 12/25/2011 0000   CO2 32 12/06/2021 1436   CO2 32 12/25/2011 0000   GLUCOSE 142 (H) 12/06/2021 1436   BUN 11 12/06/2021 1436   BUN 13 12/25/2011 0000   CREATININE 0.88 12/06/2021 1436   CALCIUM 9.5 12/06/2021 1436   CALCIUM 9.7 12/25/2011 0000   GFRNONAA >60 06/12/2020 0453   GFRAA >60 03/08/2016 0305    BNP No results found for: "BNP"  ProBNP No results found for: "PROBNP"  Imaging: No results found.   Assessment & Plan:   OSA (obstructive sleep apnea) - Split-night sleep study on 10 03/2016 showed severe obstructive sleep apnea, AHI 54.4/hour  with SpO2 low 86%. Optimal CPAP pressure of 12 cm H2O.  He has been maintained on CPAP since and remains compliant with use.  His previous CPAP machine stopped working in 2020/2021.  He was able to obtain a friend's CPAP machine and has been using this since.  Download from SD card showed patient is 93% compliant with usage greater than 4 hours over the last 30 days. Current pressure 9 cm H2O; Residual AHI 0.3/hr.  Order placed for patient to receive new CPAP  machine.  Recommend increasing pressure to 11 cm H2O due to residual AHI's on download.  Patient need follow-up 31 to 90 days after receiving new CPAP for compliance check.  Glenford Bayley, NP 11/25/2022

## 2022-11-25 NOTE — Assessment & Plan Note (Signed)
-   Split-night sleep study on 10 03/2016 showed severe obstructive sleep apnea, AHI 54.4/hour with SpO2 low 86%. Optimal CPAP pressure of 12 cm H2O.  He has been maintained on CPAP since and remains compliant with use.  His previous CPAP machine stopped working in 2020/2021.  He was able to obtain a friend's CPAP machine and has been using this since.  Download from SD card showed patient is 93% compliant with usage greater than 4 hours over the last 30 days. Current pressure 9 cm H2O; Residual AHI 0.3/hr.  Order placed for patient to receive new CPAP machine.  Recommend increasing pressure to 11 cm H2O due to residual AHI's on download.  Patient need follow-up 31 to 90 days after receiving new CPAP for compliance check.

## 2022-11-25 NOTE — Patient Instructions (Addendum)
- Nice meeting you Todd Mercado - Sleep apnea for the most part is well-controlled on current pressure settings - You are having a couple residual apneas but significantly improved from your original sleep study in 2017 - Do recommend that we adjust your pressure from 9 cm H2O to 11 cm H2O - We will place an order for you to receive a new CPAP machine and get CPAP supplies through medical supply store - Call us if you have not heard from medical supply store in 2 weeks to follow-up on order that we placed for new machine today   Orders: CPAP machine 11cm h20 and supplies    Follow-up 8 to 12 weeks with Beth for CPAP compliance (can be virtual)  CPAP and BIPAP Information CPAP and BIPAP are methods that use air pressure to keep your airways open and to help you breathe well. CPAP and BIPAP use different amounts of pressure. Your health care provider will tell you whether CPAP or BIPAP would be more helpful for you. CPAP stands for "continuous positive airway pressure." With CPAP, the amount of pressure stays the same while you breathe in (inhale) and out (exhale). BIPAP stands for "bi-level positive airway pressure." With BIPAP, the amount of pressure will be higher when you inhale and lower when you exhale. This allows you to take larger breaths. CPAP or BIPAP may be used in the hospital, or your health care provider may want you to use it at home. You may need to have a sleep study before your health care provider can order a machine for you to use at home. What are the advantages? CPAP or BIPAP can be helpful if you have: Sleep apnea. Chronic obstructive pulmonary disease (COPD). Heart failure. Medical conditions that cause muscle weakness, including muscular dystrophy or amyotrophic lateral sclerosis (ALS). Other problems that cause breathing to be shallow, weak, abnormal, or difficult. CPAP and BIPAP are most commonly used for obstructive sleep apnea (OSA) to keep the airways from  collapsing when the muscles relax during sleep. What are the risks? Generally, this is a safe treatment. However, problems may occur, including: Irritated skin or skin sores if the mask does not fit properly. Dry or stuffy nose or nosebleeds. Dry mouth. Feeling gassy or bloated. Sinus or lung infection if the equipment is not cleaned properly. When should CPAP or BIPAP be used? In most cases, the mask only needs to be worn during sleep. Generally, the mask needs to be worn throughout the night and during any daytime naps. People with certain medical conditions may also need to wear the mask at other times, such as when they are awake. Follow instructions from your health care provider about when to use the machine. What happens during CPAP or BIPAP?  Both CPAP and BIPAP are provided by a small machine with a flexible plastic tube that attaches to a plastic mask that you wear. Air is blown through the mask into your nose or mouth. The amount of pressure that is used to blow the air can be adjusted on the machine. Your health care provider will set the pressure setting and help you find the best mask for you. Tips for using the mask Because the mask needs to be snug, some people feel trapped or closed-in (claustrophobic) when first using the mask. If you feel this way, you may need to get used to the mask. One way to do this is to hold the mask loosely over your nose or mouth and then  gradually apply the mask more snugly. You can also gradually increase the amount of time that you use the mask. Masks are available in various types and sizes. If your mask does not fit well, talk with your health care provider about getting a different one. Some common types of masks include: Full face masks, which fit over the mouth and nose. Nasal masks, which fit over the nose. Nasal pillow or prong masks, which fit into the nostrils. If you are using a mask that fits over your nose and you tend to breathe through  your mouth, a chin strap may be applied to help keep your mouth closed. Use a skin barrier to protect your skin as told by your health care provider. Some CPAP and BIPAP machines have alarms that may sound if the mask comes off or develops a leak. If you have trouble with the mask, it is very important that you talk with your health care provider about finding a way to make the mask easier to tolerate. Do not stop using the mask. There could be a negative impact on your health if you stop using the mask. Tips for using the machine Place your CPAP or BIPAP machine on a secure table or stand near an electrical outlet. Know where the on/off switch is on the machine. Follow instructions from your health care provider about how to set the pressure on your machine and when you should use it. Do not eat or drink while the CPAP or BIPAP machine is on. Food or fluids could get pushed into your lungs by the pressure of the CPAP or BIPAP. For home use, CPAP and BIPAP machines can be rented or purchased through home health care companies. Many different brands of machines are available. Renting a machine before purchasing may help you find out which particular machine works well for you. Your health insurance company may also decide which machine you may get. Keep the CPAP or BIPAP machine and attachments clean. Ask your health care provider for specific instructions. Check the humidifier if you have a dry stuffy nose or nosebleeds. Make sure it is working correctly. Follow these instructions at home: Take over-the-counter and prescription medicines only as told by your health care provider. Ask if you can take sinus medicine if your sinuses are blocked. Do not use any products that contain nicotine or tobacco. These products include cigarettes, chewing tobacco, and vaping devices, such as e-cigarettes. If you need help quitting, ask your health care provider. Keep all follow-up visits. This is  important. Contact a health care provider if: You have redness or pressure sores on your head, face, mouth, or nose from the mask or head gear. You have trouble using the CPAP or BIPAP machine. You cannot tolerate wearing the CPAP or BIPAP mask. Someone tells you that you snore even when wearing your CPAP or BIPAP. Get help right away if: You have trouble breathing. You feel confused. Summary CPAP and BIPAP are methods that use air pressure to keep your airways open and to help you breathe well. If you have trouble with the mask, it is very important that you talk with your health care provider about finding a way to make the mask easier to tolerate. Do not stop using the mask. There could be a negative impact to your health if you stop using the mask. Follow instructions from your health care provider about when to use the machine. This information is not intended to replace advice given to you  by your health care provider. Make sure you discuss any questions you have with your health care provider. Document Revised: 11/28/2020 Document Reviewed: 03/30/2020 Elsevier Patient Education  2023 [employer].

## 2022-11-26 NOTE — Progress Notes (Signed)
Reviewed and agree with assessment/plan.   Coralyn Helling, MD [facility] 11/26/2022, 8:18 AM Pager:  [phone]

## 2022-12-02 ENCOUNTER — Ambulatory Visit (HOSPITAL_COMMUNITY)
Admission: RE | Admit: 2022-12-02 | Discharge: 2022-12-02 | Disposition: A | Discharge status: Home / Self Care | Payer: Medicaid Other | Source: Ambulatory Visit | Attending: Family Medicine | Admitting: Family Medicine

## 2022-12-02 DIAGNOSIS — G4733 Obstructive sleep apnea (adult) (pediatric): Secondary | ICD-10-CM | POA: Insufficient documentation

## 2022-12-02 DIAGNOSIS — F17200 Nicotine dependence, unspecified, uncomplicated: Secondary | ICD-10-CM | POA: Insufficient documentation

## 2022-12-02 DIAGNOSIS — I10 Essential (primary) hypertension: Secondary | ICD-10-CM | POA: Insufficient documentation

## 2022-12-02 DIAGNOSIS — J439 Emphysema, unspecified: Secondary | ICD-10-CM | POA: Insufficient documentation

## 2022-12-08 ENCOUNTER — Telehealth: Payer: Self-pay | Admitting: Family Medicine

## 2022-12-08 NOTE — Telephone Encounter (Signed)
Please call patient.   His coronary calcium score is 0.  This is the best possible score available. Great news. This places him at low risk for heart attack at this time.  We can repeat this screening in around 5 years if necessary.

## 2022-12-08 NOTE — Telephone Encounter (Signed)
Attempted to contact pt and was unable to LVM 

## 2022-12-08 NOTE — Telephone Encounter (Signed)
The phone disconnected when I place the patient on hold, he had some additional questions regarding his lab results. Please give the patient a call back to discuss.

## 2022-12-09 ENCOUNTER — Encounter: Payer: Self-pay | Admitting: Family Medicine

## 2022-12-09 DIAGNOSIS — I700 Atherosclerosis of aorta: Secondary | ICD-10-CM | POA: Insufficient documentation

## 2022-12-15 DIAGNOSIS — G4733 Obstructive sleep apnea (adult) (pediatric): Secondary | ICD-10-CM | POA: Diagnosis not present

## 2022-12-29 ENCOUNTER — Encounter: Payer: Medicaid Other | Admitting: Sports Medicine

## 2023-01-02 ENCOUNTER — Encounter: Payer: Self-pay | Admitting: Sports Medicine

## 2023-01-02 ENCOUNTER — Ambulatory Visit (INDEPENDENT_AMBULATORY_CARE_PROVIDER_SITE_OTHER): Payer: Medicaid Other

## 2023-01-02 ENCOUNTER — Ambulatory Visit (INDEPENDENT_AMBULATORY_CARE_PROVIDER_SITE_OTHER): Payer: Medicaid Other | Admitting: Sports Medicine

## 2023-01-02 DIAGNOSIS — M7542 Impingement syndrome of left shoulder: Secondary | ICD-10-CM

## 2023-01-02 DIAGNOSIS — M542 Cervicalgia: Secondary | ICD-10-CM

## 2023-01-02 HISTORY — DX: Impingement syndrome of left shoulder: M75.42

## 2023-01-02 MED ORDER — MELOXICAM 15 MG PO TABS: ORAL_TABLET | ORAL | 3 refills | Status: DC

## 2023-01-02 NOTE — Assessment & Plan Note (Signed)
Long history of pain left shoulder over the deltoid worse with abduction, positive impingement signs on exam, we discussed the anatomy and pathophysiology of rotator cuff dysfunction, we will do a short course of meloxicam and he will keep a close eye on his blood pressure over the next week or 2, he will then discontinue. I would like x-rays of his neck and shoulder, home physical therapy for her shoulder, return in 6 weeks, subacromial injection if not better.

## 2023-01-02 NOTE — Progress Notes (Signed)
    Procedures performed today:    None.  Independent interpretation of notes and tests performed by another provider:   None.  Brief History, Exam, Impression, and Recommendations:    Impingement syndrome, shoulder, left Long history of pain left shoulder over the deltoid worse with abduction, positive impingement signs on exam, we discussed the anatomy and pathophysiology of rotator cuff dysfunction, we will do a short course of meloxicam and he will keep a close eye on his blood pressure over the next week or 2, he will then discontinue. I would like x-rays of his neck and shoulder, home physical therapy for her shoulder, return in 6 weeks, subacromial injection if not better.    ____________________________________________ Ihor Austin. Benjamin Stain, M.D., ABFM., CAQSM., AME. Primary Care and Sports Medicine [facility]  Adjunct Professor of Family Medicine  [employer] [facility] [employer]  Restaurant manager, fast food

## 2023-01-10 DIAGNOSIS — G4733 Obstructive sleep apnea (adult) (pediatric): Secondary | ICD-10-CM | POA: Diagnosis not present

## 2023-01-12 ENCOUNTER — Encounter: Payer: Self-pay | Admitting: Internal Medicine

## 2023-01-12 ENCOUNTER — Ambulatory Visit (INDEPENDENT_AMBULATORY_CARE_PROVIDER_SITE_OTHER): Payer: Medicaid Other | Admitting: Internal Medicine

## 2023-01-12 VITALS — BP 140/72 | HR 67 | Temp 98.1°F | Ht 72.0 in | Wt 228.0 lb

## 2023-01-12 DIAGNOSIS — R053 Chronic cough: Secondary | ICD-10-CM

## 2023-01-12 DIAGNOSIS — Z9189 Other specified personal risk factors, not elsewhere classified: Secondary | ICD-10-CM | POA: Diagnosis not present

## 2023-01-12 DIAGNOSIS — Z87891 Personal history of nicotine dependence: Secondary | ICD-10-CM | POA: Diagnosis not present

## 2023-01-12 DIAGNOSIS — R0609 Other forms of dyspnea: Secondary | ICD-10-CM

## 2023-01-12 MED ORDER — SPIRIVA RESPIMAT 2.5 MCG/ACT IN AERS: 2.0000 | INHALATION_SPRAY | Freq: Every day | RESPIRATORY_TRACT | 11 refills | Status: DC

## 2023-01-12 MED ORDER — SPIRIVA RESPIMAT 2.5 MCG/ACT IN AERS: 2.0000 | INHALATION_SPRAY | Freq: Every day | RESPIRATORY_TRACT | Status: DC

## 2023-01-12 NOTE — Patient Instructions (Addendum)
ICD-10-CM   1. DOE (dyspnea on exertion)  R06.09     2. Chronic cough  R05.3     3. History of smoking greater than 50 pack years  Z87.891     4. At risk from passive smoking  Z91.89       CT scan of the chest 2024 cardiac CT without any pulmonary fibrosis or lung cancer  Clinically diagnosis here is COPD [2017 pulmonary function test was abnormal] -and leading cause of his symptoms along with a weight  Plan - Continue Breo daily - Continue albuterol as needed - Start Spiriva Respimat 2.5 mcg 2 puffs once daily -Check alpha-1 antitrypsin phenotype today. -Glad you quit smoking but you do need to reduce your exposure to passive smoking from your girlfriend and also the occasional cigarettes -Do full pulmonary function test first available -You need CT scan of the chest every year as part of lung cancer screening [we can discuss this at next visit]  Follow-up - Return to see Dr. Marchelle Gearing in 3 months or 2 months but after full pulmonary function test

## 2023-01-12 NOTE — Progress Notes (Signed)
OV 01/12/2023  Subjective:  Patient ID: Todd Mercado, male , DOB: May 09, 1960 , age 61 y.o. , MRN: [id number] , ADDRESS: [address] [place] [address] PCP Natalia Leatherwood, DO Patient Care Team: Natalia Leatherwood, DO as PCP - General (Family Medicine) Frederico Hamman, MD as Consulting Physician (Orthopedic Surgery)  This Provider for this visit: Treatment Team:  Attending Provider: Kalman Shan, MD    01/12/2023 -   Chief Complaint  Patient presents with   Pulmonary Consult    Self referral. Pt c/o DOE and wheezing for the past year. He gets winded walking up an incline.      HPI Todd Mercado 62 y.o. -is a new evaluation.  But is established in this office with Dr. Craige Cotta for sleep apnea.  His mother is in the [place] who I see for COPD.  His aunt was Dia Sitter half who passed away of rheumatoid arthritis complications of the lung.  He works as a Surveyor, minerals.  He has a prolific smoking history but has recently quit [see below] although occasionally he will have a cigarette and he gets exposed to passive smoking from his girlfriend.  His main complaint is that for the last 1 year insidious onset of shortness of breath stable since onset.  Present with exertion associated with fatigue.  Stairs can become very difficult.  It is not improved with albuterol.  It is worse after weekend of passive smoking or after heat actually has a cigarette occasionally.  There is no cough but early morning he does have a lot of congestion and brings out sputum.  There is wheezing.  There is no associated chest pain.  He did have a cardiac CT recently and I personally visualized it and it looks free of any COPD or lung cancer or ILD.  There are some mild lingular atelectasis which is not officially reported.  He has been cleared from a cardiac standpoint he does not have any associated chest pain.   Smoking history: Age 59 through age 24 [2018] -2 pack/day x 40 years making it 80 pack  smoking history and then subsequently for the next 5 years smoking 1 pack/day making it 85 pack smoking history.  And since early 2024 has quit but although occasionally will smoke a cigarette and does not get exposed to secondhand smoking every weekend with his girlfriend.  CT Chest data from date: 12/02/22 CT CARDIAC SCORE  - personally visualized and independently interpreted : yes - my findings are: see aboe DDENDUM REPORT: 12/09/2022 10:32   EXAM: OVER-READ INTERPRETATION  CT CHEST   The following report is an over-read performed by radiologist Dr. Narda Rutherford of [facility] on 12/09/2022. This over-read does not include interpretation of cardiac or coronary anatomy or pathology. The coronary calcium score interpretation by the cardiologist is attached.   COMPARISON:  None.   FINDINGS: Vascular: Mild aortic atherosclerosis. The included aorta is normal in caliber.   Mediastinum/nodes: No adenopathy or mass. Unremarkable esophagus.   Lungs: No focal airspace disease. No pulmonary nodule. No pleural fluid. The included airways are patent.   Upper abdomen: No acute findings. Simple cyst in the central left lobe of the liver, needs no further imaging follow-up.   Musculoskeletal: There are no acute or suspicious osseous abnormalities.   IMPRESSION: Aortic Atherosclerosis (ICD10-I70.0).     Electronically Signed   By: Narda Rutherford M.D.   On: 12/09/2022 10:32  PFT    Latest Ref Rng &  Units 02/27/2016   11:33 AM  PFT Results  FVC-Pre L 4.00   FVC-Predicted Pre % 75   FVC-Post L 4.33   FVC-Predicted Post % 81   Pre FEV1/FVC % % 62   Post FEV1/FCV % % 57   FEV1-Pre L 2.47   FEV1-Predicted Pre % 60   FEV1-Post L 2.49   DLCO uncorrected ml/min/mmHg 27.40   DLCO UNC% % 78   DLCO corrected ml/min/mmHg 27.72   DLCO COR %Predicted % 79   DLVA Predicted % 81   TLC L 6.27   TLC % Predicted % 84   RV % Predicted % 100        LAB RESULTS last 96  hours No results found.  LAB RESULTS last 90 days No results found for this or any previous visit (from the past 2160 hour(s)).       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   reports that he has been smoking cigarettes. He started smoking about 2 years ago. He has a 29.5 pack-year smoking history. He has quit using smokeless tobacco.  His smokeless tobacco use included snuff.  Past Surgical History:  Procedure Laterality Date   DENTAL SURGERY     TOTAL KNEE ARTHROPLASTY Right 03/07/2016   Procedure: TOTAL KNEE ARTHROPLASTY;  Surgeon: Frederico Hamman, MD;  Location: [facility] OR;  Service: Orthopedics;  Laterality: Right;    No Known Allergies  Immunization History  Administered Date(s) Administered   Influenza,inj,Quad PF,6+ Mos 02/11/2013, 01/23/2016, 03/22/2018, 06/10/2019, 01/27/2020   Influenza-Unspecified 04/29/2021, 02/02/2022   PFIZER(Purple Top)SARS-COV-2 Vaccination 08/08/2019, 09/04/2019   Pfizer Covid-19 Vaccine Bivalent Booster 36yrs & up 04/29/2021   Pneumococcal Polysaccharide-23 02/11/2013   Tdap 02/17/2022   Zoster Recombinant(Shingrix) 02/17/2022    Family History  Problem Relation Age of Onset   Hypertension Mother    COPD Mother    Arthritis Mother    Heart disease Mother        atrial fibrillation   Atrial fibrillation Mother    Emphysema Father    Early death Father    Arthritis Maternal Aunt    Diabetes Maternal Aunt    COPD Maternal Uncle    Heart failure Maternal Grandfather      Current Outpatient Medications:    albuterol (VENTOLIN HFA) 108 (90 Base) MCG/ACT inhaler, Use as directed, Disp: 8 g, Rfl: 11   amLODipine  (NORVASC) 10 MG tablet, Take 1 tablet (10 mg total) by mouth daily., Disp: 90 tablet, Rfl: 1   fluticasone furoate-vilanterol (BREO ELLIPTA) 200-25 MCG/ACT AEPB, Inhale 1 puff into the lungs daily., Disp: 120 each, Rfl: 3   meloxicam (MOBIC) 15 MG tablet, One tab PO every 24 hours with a meal for 2 weeks, then once every 24 hours prn pain., Disp: 30 tablet, Rfl: 3   Tiotropium Bromide Monohydrate (SPIRIVA RESPIMAT) 2.5 MCG/ACT AERS, Inhale 2 puffs into the lungs daily., Disp: , Rfl:    Tiotropium Bromide Monohydrate (SPIRIVA RESPIMAT) 2.5 MCG/ACT AERS, Inhale 2 puffs into the lungs daily., Disp: 4 g, Rfl: 11   sildenafil (VIAGRA) 50 MG tablet, Take 1 tablet (50 mg total) by mouth daily as needed for erectile dysfunction. (Patient not taking: Reported on 01/12/2023), Disp: 10 tablet, Rfl: 5  Objective:   Vitals:   01/12/23 0916  BP: (!) 140/72  Pulse: 67  Temp: 98.1 F (36.7 C)  TempSrc: Oral  SpO2: 91%  Weight: 228 lb (103.4 kg)  Height: 6' (1.829 m)    Estimated body mass index is 30.92 kg/m as calculated from the following:   Height as of this encounter: 6' (1.829 m).   Weight as of this encounter: 228 lb (103.4 kg).  @WEIGHTCHANGE @  [employer]   01/12/23 0916  Weight: 228 lb (103.4 kg)     Physical Exam   General: No distress. Looks stabvl O2 at rest: no Cane present: no Sitting in wheel chair: no Frail: no Obese: yes Neuro: Alert and Oriented x 3. GCS 15. Speech normal Psych: Pleasant Resp:  Barrel Chest - no.  Wheeze - no, Crackles - no, No overt respiratory distress CVS: Normal heart sounds. Murmurs - no Ext: Stigmata of Connective Tissue Disease - no HEENT: Normal upper airway. PEERL +. No post nasal drip        Assessment:       ICD-10-CM   1. DOE (dyspnea on exertion)  R06.09 Alpha-1 antitrypsin phenotype    Pulmonary function test    2. Chronic cough  R05.3 Alpha-1 antitrypsin phenotype    Pulmonary function test    3. History of smoking  greater than 50 pack years  Z87.891 Alpha-1 antitrypsin phenotype    Pulmonary function test    4. At risk from passive smoking  Z91.89 Alpha-1 antitrypsin phenotype    Pulmonary function test         Plan:     Patient Instructions     ICD-10-CM   1. DOE (dyspnea on exertion)  R06.09     2. Chronic cough  R05.3     3. History of smoking greater than 50 pack years  Z87.891     4. At risk from passive smoking  Z91.89       CT scan of the chest 2024 cardiac CT without any pulmonary fibrosis or lung cancer  Clinically diagnosis here is COPD [2017 pulmonary function test was abnormal] -and leading cause of his symptoms along with a weight  Plan - Continue Breo daily - Continue albuterol as needed - Start Spiriva Respimat 2.5 mcg 2 puffs once daily -Check alpha-1 antitrypsin phenotype today. -Glad you quit smoking but you do need to reduce your exposure to passive smoking from your girlfriend and also the occasional cigarettes -Do full pulmonary function test first available -You need CT scan of the chest every year as part of lung cancer screening [we can discuss this at next visit]  Follow-up - Return to see Dr. Marchelle Gearing in 3 months or 2 months but after full pulmonary function test   FOLLOWUP Return in about 6 months (around 07/12/2023) for 15 min visit, Face to Face OR Video Visit, with Dr Marchelle Gearing, after PFT.    SIGNATURE    Dr. Kalman Shan, M.D., F.C.C.P,  Pulmonary and Critical Care Medicine Staff Physician, [facility] Director - Interstitial Lung Disease  Program  Pulmonary Fibrosis [facility] at [facility] [place], [place], [address]  Pager: [phone], If no answer or between  15:00h - 7:00h: call [phone] Telephone: [phone]  9:49 AM 01/12/2023

## 2023-01-13 ENCOUNTER — Telehealth: Payer: Self-pay | Admitting: Internal Medicine

## 2023-01-13 NOTE — Telephone Encounter (Signed)
Patient states has pulmonary nodule on lung scan. Would like to know if he needs CT scan prior to visit. Patient phone number is [phone].

## 2023-01-14 ENCOUNTER — Encounter (HOSPITAL_BASED_OUTPATIENT_CLINIC_OR_DEPARTMENT_OTHER): Payer: Self-pay

## 2023-01-15 DIAGNOSIS — G4733 Obstructive sleep apnea (adult) (pediatric): Secondary | ICD-10-CM | POA: Diagnosis not present

## 2023-01-19 ENCOUNTER — Ambulatory Visit (HOSPITAL_COMMUNITY)
Admission: RE | Admit: 2023-01-19 | Discharge: 2023-01-19 | Disposition: A | Discharge status: Home / Self Care | Payer: Medicaid Other | Source: Ambulatory Visit | Attending: Internal Medicine | Admitting: Internal Medicine

## 2023-01-19 DIAGNOSIS — Z9189 Other specified personal risk factors, not elsewhere classified: Secondary | ICD-10-CM | POA: Diagnosis present

## 2023-01-19 DIAGNOSIS — R053 Chronic cough: Secondary | ICD-10-CM | POA: Insufficient documentation

## 2023-01-19 DIAGNOSIS — R0609 Other forms of dyspnea: Secondary | ICD-10-CM | POA: Insufficient documentation

## 2023-01-19 DIAGNOSIS — Z87891 Personal history of nicotine dependence: Secondary | ICD-10-CM | POA: Diagnosis present

## 2023-01-19 LAB — PULMONARY FUNCTION TEST
DL/VA % pred: 77 %
DL/VA: 3.27 ml/min/mmHg/L
DLCO unc % pred: 63 %
DLCO unc: 18.81 ml/min/mmHg
FEF 25-75 Post: 0.63 L/s
FEF 25-75 Pre: 0.49 L/s
FEF2575-%Change-Post: 28 %
FEF2575-%Pred-Post: 19 %
FEF2575-%Pred-Pre: 15 %
FEV1-%Change-Post: 5 %
FEV1-%Pred-Post: 42 %
FEV1-%Pred-Pre: 40 %
FEV1-Post: 1.65 L
FEV1-Pre: 1.57 L
FEV1FVC-%Change-Post: 0 %
FEV1FVC-%Pred-Pre: 59 %
FEV6-%Change-Post: 7 %
FEV6-%Pred-Post: 64 %
FEV6-%Pred-Pre: 59 %
FEV6-Post: 3.16 L
FEV6-Pre: 2.94 L
FEV6FVC-%Change-Post: 1 %
FEV6FVC-%Pred-Post: 89 %
FEV6FVC-%Pred-Pre: 88 %
FVC-%Change-Post: 5 %
FVC-%Pred-Post: 71 %
FVC-%Pred-Pre: 67 %
FVC-Post: 3.68 L
FVC-Pre: 3.47 L
Post FEV1/FVC ratio: 45 %
Post FEV6/FVC ratio: 86 %
Pre FEV1/FVC ratio: 45 %
Pre FEV6/FVC Ratio: 85 %
RV % pred: 78 %
RV: 1.84 L
TLC % pred: 79 %
TLC: 5.86 L

## 2023-01-19 MED ORDER — ALBUTEROL SULFATE (2.5 MG/3ML) 0.083% IN NEBU
2.5000 mg | INHALATION_SOLUTION | Freq: Once | RESPIRATORY_TRACT | Status: AC
  Administered 2023-01-19: 2.5 mg via RESPIRATORY_TRACT

## 2023-01-20 ENCOUNTER — Telehealth: Payer: Self-pay | Admitting: Internal Medicine

## 2023-01-20 DIAGNOSIS — R911 Solitary pulmonary nodule: Secondary | ICD-10-CM

## 2023-01-20 LAB — ALPHA-1 ANTITRYPSIN PHENOTYPE: A-1 Antitrypsin, Ser: 91 mg/dL (ref 83–199)

## 2023-01-20 NOTE — Telephone Encounter (Signed)
His alpha 1 gene is abnormal. HE has 1 normal gene and another abnormal gene.. Probably contributed to copd  Plan' - see if he can do spiro/dlco before his visit in Nov 2024 .

## 2023-01-22 NOTE — Telephone Encounter (Signed)
Spoke with the pt and notified of results/recs  He verbalized understanding  He states that he already had PFT 01/19/23- please see results of this  He also states that his PCP told him that he has lung nodule according to shoulder xray done 01/02/23. Pt asks if needs further f.u on this.   01/02/23 left shoulder xray:  MPRESSION: 1. No acute fracture or dislocation. 2. Possible nodular density of the LEFT upper lung versus sclerotic focus of the LEFT anterior third rib. Recommend dedicated PA and lateral chest radiograph versus chest CT without contrast for further evaluation.   These results will be called to the ordering clinician or representative by the Radiologist Assistant, and communication documented in the PACS or [employer].     Electronically Signed   By: Meda Klinefelter M.D.   On: 01/12/2023 09:32

## 2023-01-26 NOTE — Telephone Encounter (Signed)
He just had a CT scan of the chest December 02, 2022 where this was not reported.  Therefore it is kind of puzzling that the realizing this on a shoulder x-ray  Plan - I recommend starting with a chest x-ray PA and lateral -> based on the results of this we will consider a CT scan of the chest.  [I do not want to do a CT scan too soon or too late and therefore I need to strike a balance]

## 2023-02-03 DIAGNOSIS — G4733 Obstructive sleep apnea (adult) (pediatric): Secondary | ICD-10-CM | POA: Diagnosis not present

## 2023-02-04 ENCOUNTER — Ambulatory Visit: Payer: Medicaid Other

## 2023-02-04 NOTE — Telephone Encounter (Signed)
Called patient.  Patient is on board with coming to the clinic to get a cxr. He will come 02/09/2023 around 4:00 pm.    Also, patient was just recently approved for Medicaid. Now, his Medicaid will not pay for Breo.  Currently, patient is taking Breo, 1 puff every morning, (patient paid $300 for this inhaler) and Spiriva, 2 puffs every morning (this was filled while still covered under [employer], not sure if Medicaid will cover Spiriva).  Patient will check with his pharmacy and find out which maintenance inhalers are on Medicaid's formulary and call us with this information.  Will put order in for cxr, PA and LATERAL.

## 2023-02-09 ENCOUNTER — Ambulatory Visit: Payer: Medicaid Other

## 2023-02-09 DIAGNOSIS — R911 Solitary pulmonary nodule: Secondary | ICD-10-CM

## 2023-02-13 ENCOUNTER — Encounter: Payer: Self-pay | Admitting: Sports Medicine

## 2023-02-13 ENCOUNTER — Ambulatory Visit (INDEPENDENT_AMBULATORY_CARE_PROVIDER_SITE_OTHER): Payer: Medicaid Other | Admitting: Sports Medicine

## 2023-02-13 DIAGNOSIS — M47812 Spondylosis without myelopathy or radiculopathy, cervical region: Secondary | ICD-10-CM | POA: Insufficient documentation

## 2023-02-13 DIAGNOSIS — M7542 Impingement syndrome of left shoulder: Secondary | ICD-10-CM

## 2023-02-13 MED ORDER — PREDNISONE 50 MG PO TABS: ORAL_TABLET | ORAL | 0 refills | Status: DC

## 2023-02-13 MED ORDER — GABAPENTIN 300 MG PO CAPS: 300.0000 mg | ORAL_CAPSULE | Freq: Every day | ORAL | 3 refills | Status: DC

## 2023-02-13 NOTE — Assessment & Plan Note (Signed)
Pleasant 62 year old male, long-term axial neck pain with radiation up into the left occiput and over the left deltoid, nothing quite to the hands or fingertips. Does wake him from sleep. X-rays done at a previous visit showed multilevel cervical spondylosis. Starting conservatively, 5 days of prednisone, gabapentin at night, home PT, return to see me in 6 weeks, MRI if not better.

## 2023-02-13 NOTE — Assessment & Plan Note (Signed)
Much better with meloxicam and home PT.

## 2023-02-13 NOTE — Progress Notes (Signed)
    Procedures performed today:    None.  Independent interpretation of notes and tests performed by another provider:   None.  Brief History, Exam, Impression, and Recommendations:    Cervical spondylosis Pleasant 62 year old male, long-term axial neck pain with radiation up into the left occiput and over the left deltoid, nothing quite to the hands or fingertips. Does wake him from sleep. X-rays done at a previous visit showed multilevel cervical spondylosis. Starting conservatively, 5 days of prednisone, gabapentin at night, home PT, return to see me in 6 weeks, MRI if not better.  Impingement syndrome, shoulder, left Much better with meloxicam and home PT.    ____________________________________________ Ihor Austin. Benjamin Stain, M.D., ABFM., CAQSM., AME. Primary Care and Sports Medicine [facility]  Adjunct Professor of Family Medicine  [employer]  Restaurant manager, fast food

## 2023-02-14 DIAGNOSIS — G4733 Obstructive sleep apnea (adult) (pediatric): Secondary | ICD-10-CM | POA: Diagnosis not present

## 2023-03-06 DIAGNOSIS — G4733 Obstructive sleep apnea (adult) (pediatric): Secondary | ICD-10-CM | POA: Diagnosis not present

## 2023-03-15 NOTE — Telephone Encounter (Signed)
Cxr oct 2024 is clear. Wil discuss during Nov 2024 office visit

## 2023-03-17 DIAGNOSIS — G4733 Obstructive sleep apnea (adult) (pediatric): Secondary | ICD-10-CM | POA: Diagnosis not present

## 2023-03-26 ENCOUNTER — Encounter: Payer: Self-pay | Admitting: Internal Medicine

## 2023-03-26 ENCOUNTER — Ambulatory Visit: Payer: Medicaid Other | Admitting: Internal Medicine

## 2023-03-26 VITALS — BP 137/84 | HR 70 | Ht 74.0 in | Wt 229.6 lb

## 2023-03-26 DIAGNOSIS — J449 Chronic obstructive pulmonary disease, unspecified: Secondary | ICD-10-CM | POA: Diagnosis not present

## 2023-03-26 DIAGNOSIS — Z148 Genetic carrier of other disease: Secondary | ICD-10-CM

## 2023-03-26 DIAGNOSIS — F17210 Nicotine dependence, cigarettes, uncomplicated: Secondary | ICD-10-CM

## 2023-03-26 DIAGNOSIS — Z122 Encounter for screening for malignant neoplasm of respiratory organs: Secondary | ICD-10-CM | POA: Diagnosis not present

## 2023-03-26 NOTE — Progress Notes (Signed)
OV 01/12/2023  Subjective:  Patient ID: Todd Mercado, male , DOB: 07/01/1960 , age 62 y.o. , MRN: [id number] , ADDRESS: [address] [place] [address] PCP Natalia Leatherwood, DO Patient Care Team: Natalia Leatherwood, DO as PCP - General (Family Medicine) Frederico Hamman, MD as Consulting Physician (Orthopedic Surgery)  This Provider for this visit: Treatment Team:  Attending Provider: Kalman Shan, MD    01/12/2023 -   Chief Complaint  Patient presents with   Pulmonary Consult    Self referral. Pt c/o DOE and wheezing for the past year. He gets winded walking up an incline.      HPI Todd Mercado 62 y.o. -is a new evaluation.  But is established in this office with Dr. Craige Cotta for sleep apnea.  His mother is in the [place] who I see for COPD.  His aunt was Dia Sitter half who passed away of rheumatoid arthritis complications of the lung.  He works as a Surveyor, minerals.  He has a prolific smoking history but has recently quit [see below] although occasionally he will have a cigarette and he gets exposed to passive smoking from his girlfriend.  His main complaint is that for the last 1 year insidious onset of shortness of breath stable since onset.  Present with exertion associated with fatigue.  Stairs can become very difficult.  It is not improved with albuterol.  It is worse after weekend of passive smoking or after heat actually has a cigarette occasionally.  There is no cough but early morning he does have a lot of congestion and brings out sputum.  There is wheezing.  There is no associated chest pain.  He did have a cardiac CT recently and I personally visualized it and it looks free of any COPD or lung cancer or ILD.  There are some mild lingular atelectasis which is not officially reported.  He has been cleared from a cardiac standpoint he does not have any associated chest pain.   Smoking history: Age 14 through age 62 [2018] -2 pack/day x 40 years making it 80 pack  smoking history and then subsequently for the next 5 years smoking 1 pack/day making it 85 pack smoking history.  And since early 2024 has quit but although occasionally will smoke a cigarette and does not get exposed to secondhand smoking every weekend with his girlfriend.  CT Chest data from date: 12/02/22 CT CARDIAC SCORE  - personally visualized and independently interpreted : yes - my findings are: see aboe DDENDUM REPORT: 12/09/2022 10:32   EXAM: OVER-READ INTERPRETATION  CT CHEST   The following report is an over-read performed by radiologist Dr. Narda Rutherford of [facility] on 12/09/2022. This over-read does not include interpretation of cardiac or coronary anatomy or pathology. The coronary calcium score interpretation by the cardiologist is attached.   COMPARISON:  None.   FINDINGS: Vascular: Mild aortic atherosclerosis. The included aorta is normal in caliber.   Mediastinum/nodes: No adenopathy or mass. Unremarkable esophagus.   Lungs: No focal airspace disease. No pulmonary nodule. No pleural fluid. The included airways are patent.   Upper abdomen: No acute findings. Simple cyst in the central left lobe of the liver, needs no further imaging follow-up.   Musculoskeletal: There are no acute or suspicious osseous abnormalities.   IMPRESSION: Aortic Atherosclerosis (ICD10-I70.0).     Electronically Signed   By: Narda Rutherford M.D.   On: 12/09/2022 10:32   OV 03/26/2023  Subjective:  Patient  ID: Todd Mercado, male , DOB: 1960-07-04 , age 62 y.o. , MRN: [id number] , ADDRESS: [address] [place] [address] PCP Natalia Leatherwood, DO Patient Care Team: Natalia Leatherwood, DO as PCP - General (Family Medicine) Frederico Hamman, MD as Consulting Physician (Orthopedic Surgery)  This Provider for this visit: Treatment Team:  Attending Provider: Kalman Shan, MD    03/26/2023 -   Chief Complaint  Patient presents with   Follow-up     Pft f/u      HPI Todd Mercado 62 y.o. -follow-up after investigative workup.  He had pulmonary function test and it shows stage III COPD.  Significant decline in FEV1 compared to 2017.  In talking to him he tells that his dyspnea exertion is stable for the last 3 months and definitely stable for the last 1 year.  He is a little worse compared to 3 years ago and a lot worse compared to 5 or 7 years ago.  This fits in with his PFT decline over 7 years.  He says he smokes still a little bit occasionally.  He quit smoking approximately 25 years ago and his dad in his 11s died from COPD.  His dad had a been alive would have been 70 now.  In review of his test he shows that he has alpha-1 antitrypsin MZ and this along with the smoking accounts for the significant decline in lung function over 7 years.  I explained this to the patient and in no uncertain terms that that he has to quit his smoking.  He has tried Chantix in the past when his dad died and at that time he had bad dreams but is willing to try Chantix again.  He denies any suicidal or homicidal behavior.  He is now eligible for lung cancer screening which will be in August 2025.  I spent at least 3 minutes quitting smoking discussion.  PFT   XR few weeks go - visualzied and clear 02/09/23      Latest Ref Rng & Units 01/19/2023    3:06 PM 02/27/2016   11:33 AM  PFT Results  FVC-Pre L 3.47  4.00   FVC-Predicted Pre % 67  75   FVC-Post L 3.68  4.33   FVC-Predicted Post % 71  81   Pre FEV1/FVC % % 45  62   Post FEV1/FCV % % 45  57   FEV1-Pre L 1.57  2.47   FEV1-Predicted Pre % 40  60   FEV1-Post L 1.65  2.49   DLCO uncorrected ml/min/mmHg 18.81  27.40   DLCO UNC% % 63  78   DLCO corrected ml/min/mmHg  27.72   DLCO COR %Predicted %  79   DLVA Predicted % 77  81   TLC L 5.86  6.27   TLC % Predicted % 79  84   RV % Predicted % 78  100         LAB RESULTS last 96 hours No results found.  LAB RESULTS last 90 days Recent  Results (from the past 2160 hour(s))  Alpha-1 antitrypsin phenotype     Status: None   Collection Time: 01/12/23  9:51 AM  Result Value Ref Range   A-1 Antitrypsin, Ser 91 83 - 199 mg/dL   QIHKV-[id number] (AAT) PHENOTYPE SEE NOTE     Comment: THIS PATIENT'S ALPHA-1-ANTITRYPSIN PHENOTYPE IS PI*MZ. . 90% of normal individuals have the MM phenotype, with normal quantitative AAT levels. Many phenotypic patterns have been described, including deficiency  states with F, S, Z, or other alleles. As a general estimation, compared to M allele of 100% of normal A-1-Antitrypsin protein, the S allele produces approximately 60% and the Z allele 20%. For example, an MS phenotype would have about 80% of normal A-1-Antitrypsin protein level, a 50% contribution from the M allele and 30% from the S allele. A ZZ phenotype would have about 20% of normal levels, a 10% contribution from each Z gene. The F allele has normal A-1-Antitrypsin levels, but the kinetics of elastase inhibition is not as efficient as an M allele product; F alleles should be considered functionally mildly deficient. Other variants are identifiable by phenotypic analysis. These include CM, DP, EM, GM, IS, LM, M1M2, M3M3, MP, MT, XX, MY, and M1N. I, P, T and  null alleles are considered deleterious. C, D, E, G, L, M1, M2, M3, X and Y alleles are generally considered normal variants. The MZ-Pratt phenotype is a normal variant; care should be taken to avoid confusion with the deficient MZ phenotype.   Pulmonary function test     Status: None   Collection Time: 01/19/23  3:06 PM  Result Value Ref Range   FVC-Pre 3.47 L   FVC-%Pred-Pre 67 %   FVC-Post 3.68 L   FVC-%Pred-Post 71 %   FVC-%Change-Post 5 %   FEV1-Pre 1.57 L   FEV1-%Pred-Pre 40 %   FEV1-Post 1.65 L   FEV1-%Pred-Post 42 %   FEV1-%Change-Post 5 %   FEV6-Pre 2.94 L   FEV6-%Pred-Pre 59 %   FEV6-Post 3.16 L   FEV6-%Pred-Post 64 %   FEV6-%Change-Post 7 %   Pre  FEV1/FVC ratio 45 %   FEV1FVC-%Pred-Pre 59 %   Post FEV1/FVC ratio 45 %   FEV1FVC-%Change-Post 0 %   Pre FEV6/FVC Ratio 85 %   FEV6FVC-%Pred-Pre 88 %   Post FEV6/FVC ratio 86 %   FEV6FVC-%Pred-Post 89 %   FEV6FVC-%Change-Post 1 %   FEF 25-75 Pre 0.49 L/sec   FEF2575-%Pred-Pre 15 %   FEF 25-75 Post 0.63 L/sec   FEF2575-%Pred-Post 19 %   FEF2575-%Change-Post 28 %   RV 1.84 L   RV % pred 78 %   TLC 5.86 L   TLC % pred 79 %   DLCO unc 18.81 ml/min/mmHg   DLCO unc % pred 63 %   DL/VA 9.51 ml/min/mmHg/L   DL/VA % pred 77 %         has a past medical history of Allergy, Anxiety (06/21/2018), Arthritis, Cigar smoker motivated to quit, Community acquired pneumonia of right lung (06/21/2020), COPD (chronic obstructive pulmonary disease) (HCC), Current every day smoker (06/02/2019), Hypertension, Hypoxia (06/09/2020), OSA (obstructive sleep apnea) (2004), 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   reports that he has been smoking cigarettes. He started smoking about 2 years ago. He has a 29.5 pack-year smoking history. He has quit using smokeless tobacco.  His smokeless tobacco use included snuff.  Past Surgical History:  Procedure Laterality Date   DENTAL SURGERY     TOTAL KNEE ARTHROPLASTY Right 03/07/2016   Procedure: TOTAL KNEE ARTHROPLASTY;  Surgeon: Frederico Hamman, MD;  [facility] OR;  Service: Orthopedics;  Laterality: Right;    No Known Allergies  Immunization History  Administered Date(s) Administered   Influenza,inj,Quad PF,6+ Mos 02/11/2013, 01/23/2016, 03/22/2018, 06/10/2019, 01/27/2020   Influenza-Unspecified 04/29/2021, 02/02/2022   PFIZER(Purple Top)SARS-COV-2 Vaccination 08/08/2019, 09/04/2019   Pfizer Covid-19 Vaccine Bivalent Booster 92yrs & up 04/29/2021   Pneumococcal Polysaccharide-23 02/11/2013   Tdap 02/17/2022   Zoster  Recombinant(Shingrix) 02/17/2022    Family History  Problem Relation Age of Onset   Hypertension Mother    COPD Mother     Arthritis Mother    Heart disease Mother        atrial fibrillation   Atrial fibrillation Mother    Emphysema Father    Early death Father    Arthritis Maternal Aunt    Diabetes Maternal Aunt    COPD Maternal Uncle    Heart failure Maternal Grandfather      Current Outpatient Medications:    albuterol (VENTOLIN HFA) 108 (90 Base) MCG/ACT inhaler, Use as directed, Disp: 8 g, Rfl: 11   amLODipine (NORVASC) 10 MG tablet, Take 1 tablet (10 mg total) by mouth daily., Disp: 90 tablet, Rfl: 1   fluticasone furoate-vilanterol (BREO ELLIPTA) 200-25 MCG/ACT AEPB, Inhale 1 puff into the lungs daily., Disp: 120 each, Rfl: 3   gabapentin (NEURONTIN) 300 MG capsule, Take 1 capsule (300 mg total) by mouth at bedtime., Disp: 30 capsule, Rfl: 3   meloxicam (MOBIC) 15 MG tablet, One tab PO every 24 hours with a meal for 2 weeks, then once every 24 hours prn pain., Disp: 30 tablet, Rfl: 3   predniSONE (DELTASONE) 50 MG tablet, One tab PO daily for 5 days., Disp: 5 tablet, Rfl: 0   sildenafil (VIAGRA) 50 MG tablet, Take 1 tablet (50 mg total) by mouth daily as needed for erectile dysfunction., Disp: 10 tablet, Rfl: 5   Tiotropium Bromide Monohydrate (SPIRIVA RESPIMAT) 2.5 MCG/ACT AERS, Inhale 2 puffs into the lungs daily., Disp: , Rfl:    Tiotropium Bromide Monohydrate (SPIRIVA RESPIMAT) 2.5 MCG/ACT AERS, Inhale 2 puffs into the lungs daily., Disp: 4 g, Rfl: 11      Objective:   Vitals:   03/26/23 1346  BP: 137/84  Pulse: 70  SpO2: 93%  Weight: 229 lb 9.6 oz (104.1 kg)  Height: 6\' 2"  (1.88 m)    Estimated body mass index is 29.48 kg/m as calculated from the following:   Height as of this encounter: 6\' 2"  (1.88 m).   Weight as of this encounter: 229 lb 9.6 oz (104.1 kg).  @WEIGHTCHANGE @  [employer]   03/26/23 1346  Weight: 229 lb 9.6 oz (104.1 kg)     Physical Exam   General: No distress. Looks well O2 at rest: no Cane present: no Sitting in wheel chair: no Frail: no Obese:  no Neuro: Alert and Oriented x 3. GCS 15. Speech normal Psych: Pleasant Resp:  Barrel Chest - no.  Wheeze - no, Crackles - no, No overt respiratory distress CVS: Normal heart sounds. Murmurs - no Ext: Stigmata of Connective Tissue Disease - no HEENT: Normal upper airway. PEERL +. No post nasal drip        Assessment:       ICD-10-CM   1. Stage 3 severe COPD by GOLD classification (HCC)  J44.9     2. Alpha-1-antitrypsin deficiency carrier  Z14.8     3. Smoking greater than 30 pack years  F17.210     4. Encounter for screening for lung cancer  Z12.2          Plan:     Patient Instructions     ICD-10-CM   1. Stage 3 severe COPD by GOLD classification (HCC)  J44.9     2. Alpha-1-antitrypsin deficiency carrier  Z14.8     3. Smoking greater than 30 pack years  F17.210     4. Encounter for screening for  lung cancer  Z12.2       #COPD #Alpha-1 antitrypsin MZ  -Currently stable.  Do have a decline in lung function over the last 7 years because of your smoking history and also because you have alpha-1 antitrypsin deficiency carrier.  Your dad probably was a full-blown alpha-1 patient because he died in his 50s with COPD  Plan - Continue Breo daily - Continue albuterol as needed - Start Spiriva Respimat 2.5 mcg 2 puffs once daily - see video on Alpha 1  #Smoking  Definitely need to quit.  We discussed quitting smoking cessation.  Total effort in this counseling was at least 3 minutes  Plan - #SMoking  - glad you want to quit and I understand you are using expired samples -  start chantix as directed  - Days 1-3: 0.5 mg once daily  - Days 4-7: 0.5 mg twice daily  - Maintenance (>= Day 8):  1 mg twice daily for 11 weeks   - if you have bad dream stop medication and call us  - take the medicaiton as instructed and after food - quit smoking  1 week after starting chantix    #Lung cancer screening  Plan - Next low-dose CT scan of the chest without contrast in  October 2025  Follow-up - Return to see nurse practitioner in 3 months to discuss quit smoking efforts   FOLLOWUP Return in about 3 months (around 06/26/2023) for 15 min visit, with any of the APPS, Smoking, Face to Face OR Video Visit.    SIGNATURE    Dr. Kalman Shan, M.D., F.C.C.P,  Pulmonary and Critical Care Medicine Staff Physician, [facility] Center Director - Interstitial Lung Disease  Program  Pulmonary Fibrosis [facility] at [facility] [place], [place], [address]  Pager: [phone], If no answer or between  15:00h - 7:00h: call [phone] Telephone: [phone]  2:17 PM 03/26/2023

## 2023-03-26 NOTE — Patient Instructions (Addendum)
ICD-10-CM   1. Stage 3 severe COPD by GOLD classification (HCC)  J44.9     2. Alpha-1-antitrypsin deficiency carrier  Z14.8     3. Smoking greater than 30 pack years  F17.210     4. Encounter for screening for lung cancer  Z12.2       #COPD #Alpha-1 antitrypsin MZ  -Currently stable.  Do have a decline in lung function over the last 7 years because of your smoking history and also because you have alpha-1 antitrypsin deficiency carrier.  Your dad probably was a full-blown alpha-1 patient because he died in his 43s with COPD  Plan - Continue Breo daily - Continue albuterol as needed - Start Spiriva Respimat 2.5 mcg 2 puffs once daily - see video on Alpha 1  #Smoking  Definitely need to quit.  We discussed quitting smoking cessation.  Total effort in this counseling was at least 3 minutes  Plan - #SMoking  - glad you want to quit and I understand you are using expired samples -  start chantix as directed  - Days 1-3: 0.5 mg once daily  - Days 4-7: 0.5 mg twice daily  - Maintenance (>= Day 8):  1 mg twice daily for 11 weeks   - if you have bad dream stop medication and call us  - take the medicaiton as instructed and after food - quit smoking  1 week after starting chantix    #Lung cancer screening  Plan - Next low-dose CT scan of the chest without contrast in October 2025  Follow-up - Return to see nurse practitioner in 3 months to discuss quit smoking efforts

## 2023-03-27 ENCOUNTER — Ambulatory Visit: Payer: Medicaid Other | Admitting: Sports Medicine

## 2023-04-16 DIAGNOSIS — G4733 Obstructive sleep apnea (adult) (pediatric): Secondary | ICD-10-CM | POA: Diagnosis not present

## 2023-04-30 ENCOUNTER — Other Ambulatory Visit: Payer: Self-pay | Admitting: Sports Medicine

## 2023-04-30 DIAGNOSIS — M7542 Impingement syndrome of left shoulder: Secondary | ICD-10-CM

## 2023-05-12 ENCOUNTER — Ambulatory Visit: Payer: Medicaid Other | Admitting: Family Medicine

## 2023-05-17 DIAGNOSIS — G4733 Obstructive sleep apnea (adult) (pediatric): Secondary | ICD-10-CM | POA: Diagnosis not present

## 2023-05-18 ENCOUNTER — Encounter: Payer: Self-pay | Admitting: Family Medicine

## 2023-05-18 ENCOUNTER — Ambulatory Visit (INDEPENDENT_AMBULATORY_CARE_PROVIDER_SITE_OTHER): Payer: Medicaid Other | Admitting: Family Medicine

## 2023-05-18 VITALS — BP 128/86 | HR 57 | Temp 97.7°F | Wt 239.8 lb

## 2023-05-18 DIAGNOSIS — N528 Other male erectile dysfunction: Secondary | ICD-10-CM | POA: Diagnosis not present

## 2023-05-18 DIAGNOSIS — I10 Essential (primary) hypertension: Secondary | ICD-10-CM

## 2023-05-18 DIAGNOSIS — G4733 Obstructive sleep apnea (adult) (pediatric): Secondary | ICD-10-CM | POA: Diagnosis not present

## 2023-05-18 DIAGNOSIS — J432 Centrilobular emphysema: Secondary | ICD-10-CM

## 2023-05-18 MED ORDER — FLUTICASONE FUROATE-VILANTEROL 200-25 MCG/ACT IN AEPB: 1.0000 | INHALATION_SPRAY | Freq: Every day | RESPIRATORY_TRACT | 3 refills | Status: DC

## 2023-05-18 MED ORDER — AMLODIPINE BESYLATE 10 MG PO TABS: 10.0000 mg | ORAL_TABLET | Freq: Every day | ORAL | 1 refills | Status: DC

## 2023-05-18 MED ORDER — SILDENAFIL CITRATE 50 MG PO TABS: 50.0000 mg | ORAL_TABLET | Freq: Every day | ORAL | 5 refills | Status: DC | PRN

## 2023-05-18 NOTE — Patient Instructions (Signed)
 Return in about 24 weeks (around 11/02/2023) for Routine chronic condition follow-up.       Great to get see you today.  I have refilled the medication(s) we provide.   If labs were collected or images ordered, we will inform you of  results once we have received them and reviewed. We will contact you either by echart message, or telephone call.  Please give ample time to the testing facility, and our office to run,  receive and review results. Please do not call inquiring of results, even if you can see them in your chart. We will contact you as soon as we are able. If it has been over 1 week since the test was completed, and you have not yet heard from us , then please call us .    - echart message- for normal results that have been seen by the patient already.   - telephone call: abnormal results or if patient has not viewed results in their echart.  If a referral to a specialist was entered for you, please call us  in 2 weeks if you have not heard from the specialist office to schedule.     If you are interested in weight loss counseling please make appt to discuss and bring with you 2 weeks of a food diary/log on notebook paper.  Food log is mandatory on first appt to proceed with counseling.  Weight loss counseling encompasses diet, exercise and can include  medications when appropriate and affordable.  There are routine appts for check-ins and weights to track progress and keep you on track.  Routine check-ins (in person) are also mandatory to continue with prescription refills. Check-in timeline  can range from 4 weeks to 12 weeks, depending on physician's recommendations and which step you are in of your weight loss journey.  Your BMI today is Body mass index is 30.79 kg/m.  Please check with your insurance prior to appt and ask them if they cover weight loss medications for your BMI? And if so, which medications. They may tell you some of the diabetes meds that are used for weight  loss also,  are on your formulary- but this does not mean they are covered for weight loss only.  Even if they tell with a prior auth it is covered- make sure they check to see if you personally meet criteria with your BMI.

## 2023-05-18 NOTE — Progress Notes (Signed)
 Patient ID: Todd Mercado, male  DOB: 16-Jun-1960, 63 y.o.   MRN: [id number] Patient Care Team    Relationship Specialty Notifications Start End  Todd Mercado LABOR, DO PCP - General Family Medicine  07/02/15   Todd Sieving, MD Consulting Physician Orthopedic Surgery  09/04/17     Chief Complaint  Patient presents with   Hypertension    Subjective: Todd Mercado is a 63 y.o. male present for Chronic Conditions/illness Management  All past medical history, surgical history, allergies, family history, immunizations, medications and social history were updated in the electronic medical record today. All recent labs, ED visits and hospitalizations within the last year were reviewed.  Hypertension/tobacco use: Pt reports compliance with amlodipine  10 mg daily.  Patient denies chest pain, shortness of breath, dizziness or lower extremity edema.  Pt does not take a daily baby ASA. Pt is not prescribed statin.  He continues to smoke daily about 1 cig. A day.  Diet: No routine diet Exercise: No routine exercise RF: Hypertension, overweight, smoker, family history present  OSA/pulmonary emphysema: Patient reports he is compliance with his CPAP.   He is compliant with his Breo. Needs PA now that he has new insurance.    Erectile dysfunction: Patient reports he has had erectile dysfunction for at least 2 to 3 years.  He reports he has quit smoking and he has quit drinking.  However alcohol cessation did not improve function.  He is compliant with his CPAP.  He reports he is able to have an erection, he is having difficulty sustaining erection. Viagra  is working well and he reports it works good for him without side effects.      11/14/2022    2:05 PM 05/14/2022    3:33 PM 06/13/2021    8:48 AM 06/21/2020   10:55 AM 01/27/2020   10:43 AM  Depression screen PHQ 2/9  Decreased Interest 2 0 0 0 0  Down, Depressed, Hopeless 0 0 0 0 0  PHQ - 2 Score 2 0 0 0 0  Altered sleeping 1    3  Tired,  decreased energy 2    2  Change in appetite     0  Feeling bad or failure about yourself  0    0  Trouble concentrating 3    0  Moving slowly or fidgety/restless 0    0  Suicidal thoughts 0    0  PHQ-9 Score 8    5  Difficult doing work/chores Not difficult at all          11/14/2022    2:06 PM 01/27/2020   10:44 AM 12/14/2018    1:05 PM  GAD 7 : Generalized Anxiety Score  Nervous, Anxious, on Edge 0 2 1  Control/stop worrying 0 0 1  Worry too much - different things 0 0 0  Trouble relaxing 1 2 2   Restless 0 0 0  Easily annoyed or irritable 1 2 1   Afraid - awful might happen 0 0 0  Total GAD 7 Score 2 6 5   Anxiety Difficulty Not difficult at all  Somewhat difficult           11/14/2022    2:05 PM 06/21/2018   12:27 PM  Fall Risk   Falls in the past year? 0 0  Number falls in past yr: 0 0  Injury with Fall? 0 0  Follow up Falls evaluation completed Falls evaluation completed    Immunization History  Administered Date(s) Administered  Influenza, Seasonal, Injecte, Preservative Fre 03/27/2023   Influenza,inj,Quad PF,6+ Mos 02/11/2013, 01/23/2016, 03/22/2018, 06/10/2019, 01/27/2020   Influenza-Unspecified 04/29/2021, 02/02/2022   PFIZER(Purple Top)SARS-COV-2 Vaccination 08/08/2019, 09/04/2019   Pfizer Covid-19 Vaccine Bivalent Booster 53yrs & up 04/29/2021   Pneumococcal Polysaccharide-23 02/11/2013   Tdap 02/17/2022   Zoster Recombinant(Shingrix) 02/17/2022     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   Wears dentures    No Known Allergies Past Surgical History:  Procedure Laterality  Date   DENTAL SURGERY     TOTAL KNEE ARTHROPLASTY Right 03/07/2016   Procedure: TOTAL KNEE ARTHROPLASTY;  Surgeon: Todd Silos, MD;  Location: [facility] OR;  Service: Orthopedics;  Laterality: Right;   Family History  Problem Relation Age of Onset   Hypertension Mother    COPD Mother    Arthritis Mother    Heart disease Mother        atrial fibrillation   Atrial fibrillation Mother    Emphysema Father    Early death Father    Arthritis Maternal Aunt    Diabetes Maternal Aunt    COPD Maternal Uncle    Heart failure Maternal Grandfather    Social History   Social History Narrative   Todd Mercado is divorced. He has 3 children (Todd Mercado, Todd Mercado and Todd Mercado)   HS education, employed as a media planner, operates heavy machinery   Heavy tobacco use > 45 pack year history, started smoking at age 41.    Alcohol use. No drug use.    Wears his seatbelt, smoke detector in the home.   wears dentures   Feels safe in relationships.     Allergies as of 05/18/2023   No Known Allergies      Medication List        Accurate as of May 18, 2023  1:33 PM. If you have any questions, ask your nurse or doctor.          albuterol  108 (90 Base) MCG/ACT inhaler Commonly known as: VENTOLIN  HFA Use as directed   amLODipine  10 MG tablet Commonly known as: NORVASC  Take 1 tablet (10 mg total) by mouth daily.   fluticasone  furoate-vilanterol 200-25 MCG/ACT Aepb Commonly known as: BREO ELLIPTA  Inhale 1 puff into the lungs daily.   gabapentin  300 MG capsule Commonly known as: NEURONTIN  Take 1 capsule (300 mg total) by mouth at bedtime.   meloxicam  15 MG tablet Commonly known as: MOBIC  ONE TAB BY MOUTH EVERY 24 HOURS WITH A MEAL FOR 2 WEEKS, THEN ONCE EVERY 24 HOURS AS NEEDED FOR PAIN   predniSONE  50 MG tablet Commonly known as: DELTASONE  One tab PO daily for 5 days.   sildenafil  50 MG tablet Commonly known as: Viagra  Take 1 tablet (50 mg total) by mouth daily as needed for erectile  dysfunction.   Spiriva  Respimat 2.5 MCG/ACT Aers Generic drug: Tiotropium Bromide Monohydrate  Inhale 2 puffs into the lungs daily.   Spiriva  Respimat 2.5 MCG/ACT Aers Generic drug: Tiotropium Bromide Monohydrate  Inhale 2 puffs into the lungs daily.       All past medical history,  surgical history, allergies, family history, immunizations andmedications were updated in the EMR today and reviewed under the history and medication portions of their EMR.      ROS 14 pt review of systems performed and negative (unless mentioned in an HPI)  Objective: BP 128/86   Pulse (!) 57   Temp 97.7 F (36.5 C)   Wt 239 lb 12.8 oz (108.8 kg)   SpO2 93%   BMI 30.79 kg/m  Physical Exam Vitals and nursing note reviewed.  Constitutional:      General: He is not in acute distress.    Appearance: Normal appearance. He is not ill-appearing, toxic-appearing or diaphoretic.  HENT:     Head: Normocephalic and atraumatic.  Eyes:     General: No scleral icterus.       Right eye: No discharge.        Left eye: No discharge.     Extraocular Movements: Extraocular movements intact.     Pupils: Pupils are equal, round, and reactive to light.  Cardiovascular:     Rate and Rhythm: Normal rate and regular rhythm.  Pulmonary:     Effort: Pulmonary effort is normal. No respiratory distress.     Breath sounds: Normal breath sounds. No wheezing, rhonchi or rales.  Musculoskeletal:     Right lower leg: No edema.     Left lower leg: No edema.  Skin:    General: Skin is warm.     Findings: No rash.  Neurological:     Mental Status: He is alert and oriented to person, place, and time. Mental status is at baseline.  Psychiatric:        Mood and Affect: Mood normal.        Behavior: Behavior normal.        Thought Content: Thought content normal.        Judgment: Judgment normal.    No results found.  Assessment/plan: Rahmel Nedved is a 63 y.o. male present for Chronic Conditions/illness  Management Essential hypertension, benign/Morbid obesity (HCC) Stable Continue  amlodipine  10 mg QD . tried meds:  lisinopril - cough.  Low sodium Diet, exercise Coronary cardiac score ZERO (12/02/2022)  OSA (obstructive sleep apnea)/Pulmonary emphysema, unspecified emphysema type (HCC)/wheezing Lungs have significant decrease in air movement today.  DuoNeb treatment x 1 provided with some improvement. We discussed referral back to pulmonology for further evaluation on both his COPD and his OSA.  Patient is agreeable to this today. Continue Breo.  - continue  albuterol  as needed -Compliant with CPAP   Erectile dysfunction: Discussed proper use of sildenafil /Viagra  like product with him today. Discussed emergent precautions if dizziness or chest pain is experienced with use. Continue Viagra  50 mg daily as needed    No follow-ups on file.   No orders of the defined types were placed in this encounter.  No orders of the defined types were placed in this encounter.  Referral Orders  No referral(s) requested today      Note is dictated utilizing voice recognition software. Although note has been proof read prior to signing, occasional typographical errors still can be missed. If any questions arise, please do not hesitate to call for verification.  Electronically signed by: Mercado Bellini, DO [facility] [place] [facility]

## 2023-05-26 ENCOUNTER — Other Ambulatory Visit: Payer: Self-pay | Admitting: Family Medicine

## 2023-05-26 NOTE — Telephone Encounter (Signed)
Please inform patient we need to change his inhaler to [facility] for insurance purposes, I have called this in today.

## 2023-06-04 NOTE — Telephone Encounter (Signed)
Copied from CRM [id number]. Topic: Clinical - Medication Question >> Jun 04, 2023 12:26 PM Denese Killings wrote: Reason for CRM: Patient stated that Drug store changed medications and gave him a different medication for COPD. He wants to know what imometasone-formoterol (DULERA) 200-5 MCG/ACT AERO is replacing and he has concerns regarding 60 day supply with 2 puff sin the mornign and at night. He said ther medicine is only going to last 15 days. He wants to know if he needs 120 supply or just take 2 puffs a day.

## 2023-06-05 NOTE — Telephone Encounter (Signed)
 Copied from CRM [id number]. Topic: Clinical - Medication Question >> Jun 04, 2023 12:26 PM Todd Mercado wrote: Reason for CRM: Patient stated that Drug store changed medications and gave him a different medication for COPD. He wants to know what imometasone-formoterol (DULERA) 200-5 MCG/ACT AERO is replacing and he has concerns regarding 60 day supply with 2 puff sin the mornign and at night. He said ther medicine is only going to last 15 days. He wants to know if he needs 120 supply or just take 2 puffs a day.

## 2023-06-05 NOTE — Telephone Encounter (Signed)
 Copied from CRM [id number]. Topic: Clinical - Medication Question >> Jun 04, 2023 12:26 PM Denese Killings wrote: Reason for CRM: Patient stated that Drug store changed medications and gave him a different medication for COPD. He wants to know what imometasone-formoterol (DULERA) 200-5 MCG/ACT AERO is replacing and he has concerns regarding 60 day supply with 2 puff sin the mornign and at night. He said ther medicine is only going to last 15 days. He wants to know if he needs 120 supply or just take 2 puffs a day.

## 2023-06-05 NOTE — Telephone Encounter (Signed)
Msg already routed to PCP

## 2023-06-05 NOTE — Telephone Encounter (Signed)
Todd Mercado is replacing his prior daily  inhaler for copd

## 2023-06-10 NOTE — Telephone Encounter (Signed)
 Copied from CRM [id number]. Topic: Clinical - Medication Question >> Jun 10, 2023  2:04 PM Gerardine PARAS wrote: Reason for CRM: Patient called in regarding medication change for COPD. He wants to know what imometasone-formoterol  (DULERA ) 200-5 MCG/ACT AERO is replacing this copd medication already being used and he has concerns regarding 60 day supply with 2 puffs in the morning and 2 puffs at night at night. He said the medicine is only going to last 15 days and that is concerning

## 2023-06-10 NOTE — Telephone Encounter (Addendum)
 Please advise, pt states amount given is note going to last more that 15 days

## 2023-06-11 MED ORDER — DULERA 200-5 MCG/ACT IN AERO: 2.0000 | INHALATION_SPRAY | Freq: Two times a day (BID) | RESPIRATORY_TRACT | 11 refills | Status: DC

## 2023-06-11 NOTE — Addendum Note (Signed)
 Addended by: Napolean Backbone A on: 06/11/2023 01:36 PM   Modules accepted: Orders

## 2023-06-11 NOTE — Telephone Encounter (Signed)
 If he is only getting 60 doses instead of 120 doses per inhaler, then he was dispensed the smaller of the 2 Dulera  inhalers.  I sent the prescription in again to his pharmacy and asked for the larger 120 m doses to be dispensed to him-which is what he should have had received the first time with the instructions reading 2 puffs twice a day.

## 2023-06-14 DIAGNOSIS — G4733 Obstructive sleep apnea (adult) (pediatric): Secondary | ICD-10-CM | POA: Diagnosis not present

## 2023-06-15 ENCOUNTER — Telehealth: Payer: Medicaid Other | Admitting: Nurse Practitioner

## 2023-06-15 ENCOUNTER — Telehealth: Payer: Medicaid Other

## 2023-06-15 ENCOUNTER — Ambulatory Visit: Payer: Self-pay | Admitting: Family Medicine

## 2023-06-15 ENCOUNTER — Encounter: Payer: Self-pay | Admitting: Nurse Practitioner

## 2023-06-15 DIAGNOSIS — J22 Unspecified acute lower respiratory infection: Secondary | ICD-10-CM

## 2023-06-15 MED ORDER — AMOXICILLIN-POT CLAVULANATE 875-125 MG PO TABS: 1.0000 | ORAL_TABLET | Freq: Two times a day (BID) | ORAL | 0 refills | Status: DC

## 2023-06-15 NOTE — Progress Notes (Signed)
 [facility] [address] [place] [address] Dept: [phone] Dept Fax: [phone]  Virtual Video Visit  I connected with Todd Mercado on 06/15/23 at  3:00 PM EST by a video enabled telemedicine application and verified that I am speaking with the correct person using two identifiers.  Location patient: Home Location provider: Clinic Persons participating in the virtual visit: Patient; Todd Cedar, NP; Todd Jasper, RN  I discussed the limitations of evaluation and management by telemedicine and the availability of in person appointments. The patient expressed understanding and agreed to proceed.  Chief Complaint  Patient presents with   Cough   Nasal Congestion    Sx of cough and congestion started last Thursday. GI upset and fever Friday and Saturday - stopped Sunday. Exposure to flu from significant other last week.    SUBJECTIVE:  HPI: Todd Mercado is a 63 y.o. male who presents with cough, nasal congestion for 5 days. The last time he got sick like this, he had gotten pneumonia. He also has a history of COPD.   UPPER RESPIRATORY TRACT INFECTION  Fever: yes - fever broke Cough: yes - brown Shortness of breath: no Wheezing: no Chest pain: no Chest tightness: no Chest congestion: yes Nasal congestion: yes Runny nose: no Post nasal drip: no Sneezing: no Sore throat: yes over the weekend Swollen glands: no Sinus pressure: no Headache: no Face pain: no Toothache: no Ear pain: no bilateral Ear pressure: no bilateral Eyes red/itching:no Eye drainage/crusting: no  Vomiting: no Rash: no Fatigue: yes Sick contacts: yes - girlfriend with flu symptoms  Strep contacts: no  Context: fluctuating Recurrent sinusitis: no Relief with OTC cold/cough medications: no  Treatments attempted: robitussin, tylenol , prescribed inhaler  Patient Active Problem List   Diagnosis Date Noted   Cervical  spondylosis 02/13/2023   Impingement syndrome, shoulder, left 01/02/2023   Aortic atherosclerosis (HCC) 12/09/2022   Erectile dysfunction 10/03/2020   Pulmonary emphysema (HCC) 02/27/2016   Morbid obesity (HCC) 01/24/2016   OSA (obstructive sleep apnea) 01/24/2016   History of ETOH abuse 03/11/2013   Essential hypertension, benign 02/11/2013    Past Surgical History:  Procedure Laterality Date   DENTAL SURGERY     TOTAL KNEE ARTHROPLASTY Right 03/07/2016   Procedure: TOTAL KNEE ARTHROPLASTY;  Surgeon: Marlena Sima, MD;  Location: [facility] OR;  Service: Orthopedics;  Laterality: Right;    Family History  Problem Relation Age of Onset   Hypertension Mother    COPD Mother    Arthritis Mother    Heart disease Mother        atrial fibrillation   Atrial fibrillation Mother    Emphysema Father    Early death Father    Arthritis Maternal Aunt    Diabetes Maternal Aunt    COPD Maternal Uncle    Heart failure Maternal Grandfather     Social History   Tobacco Use   Smoking status: Some Days    Average packs/day: 1 pack/day for 29.5 years (29.5 ttl pk-yrs)    Types: Cigarettes    Start date: 06/07/2020   Smokeless tobacco: Former    Types: Snuff  Vaping Use   Vaping status: Never Used  Substance Use Topics   Alcohol use: Yes    Alcohol/week: 12.0 standard drinks of alcohol    Types: 12 Cans of beer per week    Comment: 12 beers on weekend   Drug use: No     Current Outpatient Medications:  amLODipine  (NORVASC ) 10 MG tablet, Take 1 tablet (10 mg total) by mouth daily., Disp: 90 tablet, Rfl: 1   amoxicillin -clavulanate (AUGMENTIN ) 875-125 MG tablet, Take 1 tablet by mouth 2 (two) times daily., Disp: 20 tablet, Rfl: 0   meloxicam  (MOBIC ) 15 MG tablet, ONE TAB BY MOUTH EVERY 24 HOURS WITH A MEAL FOR 2 WEEKS, THEN ONCE EVERY 24 HOURS AS NEEDED FOR PAIN, Disp: 30 tablet, Rfl: 3   mometasone-formoterol  (DULERA ) 200-5 MCG/ACT AERO, Inhale 2 puffs into the lungs 2 (two) times daily.,  Disp: 13 g, Rfl: 11   sildenafil  (VIAGRA ) 50 MG tablet, Take 1 tablet (50 mg total) by mouth daily as needed for erectile dysfunction., Disp: 10 tablet, Rfl: 5   Tiotropium Bromide Monohydrate  (SPIRIVA  RESPIMAT) 2.5 MCG/ACT AERS, Inhale 2 puffs into the lungs daily., Disp: , Rfl:    Tiotropium Bromide Monohydrate  (SPIRIVA  RESPIMAT) 2.5 MCG/ACT AERS, Inhale 2 puffs into the lungs daily., Disp: 4 g, Rfl: 11   albuterol  (VENTOLIN  HFA) 108 (90 Base) MCG/ACT inhaler, Use as directed (Patient not taking: Reported on 06/15/2023), Disp: 8 g, Rfl: 11   gabapentin  (NEURONTIN ) 300 MG capsule, Take 1 capsule (300 mg total) by mouth at bedtime. (Patient not taking: Reported on 06/15/2023), Disp: 30 capsule, Rfl: 3  No Known Allergies  ROS: See pertinent positives and negatives per HPI.  OBSERVATIONS/OBJECTIVE:  VITALS per patient if applicable: Today's Vitals   06/15/23 1513  SpO2: 91%   There is no height or weight on file to calculate BMI.    GENERAL: Alert and oriented. Appears well and in no acute distress.  HEENT: Atraumatic. Conjunctiva clear. No obvious abnormalities on inspection of external nose and ears.  NECK: Normal movements of the head and neck.  LUNGS: On inspection, no signs of respiratory distress. Breathing rate appears normal. No obvious gross SOB, gasping or wheezing, and no conversational dyspnea.  CV: No obvious cyanosis.  MS: Moves all visible extremities without noticeable abnormality.  PSYCH/NEURO: Pleasant and cooperative. No obvious depression or anxiety. Speech and thought processing grossly intact.  ASSESSMENT AND PLAN:  Problem List Items Addressed This Visit   None Visit Diagnoses       Lower respiratory infection    -  Primary   With h/o pneumonia & COPD and cough brown sputum, will start augmentin  BID x10 days. Encourage fluids. Cont. prescribed inhalers. F/U if not improving     Also advised him to go to be evaluated in person or at ER if oxygen levels  go below 90%.    I discussed the assessment and treatment plan with the patient. The patient was provided an opportunity to ask questions and all were answered. The patient agreed with the plan and demonstrated an understanding of the instructions.   The patient was advised to call back or seek an in-person evaluation if the symptoms worsen or if the condition fails to improve as anticipated.   Odette Benjamin, NP

## 2023-06-15 NOTE — Patient Instructions (Signed)
 It was great to see you!  Start augmentin  1 tablet twice a day for 10 days  Drink plenty of fluids  You can take mucinex  twice a day as needed to help break up congestion  Go to urgent care or ER if your oxygen levels go below 90%  Let's follow-up if your symptoms worsen or don't improve.   Take care,  [name], NP

## 2023-06-15 NOTE — Telephone Encounter (Signed)
 No further action needed.

## 2023-06-15 NOTE — Telephone Encounter (Signed)
  Chief Complaint: cough, congestion Symptoms: cough, congestion, fever, nausea, diarrhea Frequency: 4 days Pertinent Negatives: Patient denies CP, vomiting Disposition: [] ED /[] Urgent Care (no appt availability in office) / [x] Appointment(In office/virtual)/ []  [facility]/ [] Home Care/ [] Refused Recommended Disposition /[] [facility]/ []  Follow-up with PCP Additional Notes: Patient calls reporting cough, congestion, fever, nausea and diarrhea x 4 days. Reports his spouse was experiencing flu-like symptoms last week and he feels he has the same issue now. Per protocol, patient to be evaluated within 24 hours. First available appointment with PCP 06/16/23. Patient requests VV for today. Scheduled for 1630 today at patient request. Care advice reviewed, patient verbalized understandingand denies further questions at this time. Alerting PCP for review.    Copied from CRM [id number]. Topic: Appointments - Appointment Scheduling >> Jun 15, 2023  1:32 PM [place] A wrote: Patient is having a lot of congestion in chest; fever; coughing up mucus,body aches, started Friday with upset stomach and diarrhea. Last time like this it turned into Pneumonia Reason for Disposition  Coughing up rusty-colored (reddish-brown) sputum  Answer Assessment - Initial Assessment Questions 1. ONSET: "When did the cough begin?"      4 days 2. SEVERITY: "How bad is the cough today?"      Mild, but very congested 3. SPUTUM: "Describe the color of your sputum" (none, dry cough; clear, white, yellow, green)     "Todd Mercado looking" 4. HEMOPTYSIS: "Are you coughing up any blood?" If so ask: "How much?" (flecks, streaks, tablespoons, etc.)     Denies 5. DIFFICULTY BREATHING: "Are you having difficulty breathing?" If Yes, ask: "How bad is it?" (e.g., mild, moderate, severe)    - MILD: No SOB at rest, mild SOB with walking, speaks normally in sentences, can lie down, no retractions, pulse < 100.    - MODERATE:  SOB at rest, SOB with minimal exertion and prefers to sit, cannot lie down flat, speaks in phrases, mild retractions, audible wheezing, pulse 100-120.    - SEVERE: Very SOB at rest, speaks in single words, struggling to breathe, sitting hunched forward, retractions, pulse > 120      Mild, worse with activity. Reports O2 reading 93% RA with finger probe at home. 6. FEVER: "Do you have a fever?" If Yes, ask: "What is your temperature, how was it measured, and when did it start?"     Has had a fever over the weekend. 7. CARDIAC HISTORY: "Do you have any history of heart disease?" (e.g., heart attack, congestive heart failure)      HTN 8. LUNG HISTORY: "Do you have any history of lung disease?"  (e.g., pulmonary embolus, asthma, emphysema)     COPD, emphysema 9. PE RISK FACTORS: "Do you have a history of blood clots?" (or: recent major surgery, recent prolonged travel, bedridden)     Denies 10. OTHER SYMPTOMS: "Do you have any other symptoms?" (e.g., runny nose, wheezing, chest pain)       Fever, congestion, diarrhea, nausea  12. TRAVEL: "Have you traveled out of the country in the last month?" (e.g., travel history, exposures)       Denies  Protocols used: Cough - Acute Productive-A-AH

## 2023-06-17 DIAGNOSIS — G4733 Obstructive sleep apnea (adult) (pediatric): Secondary | ICD-10-CM | POA: Diagnosis not present

## 2023-06-29 ENCOUNTER — Ambulatory Visit: Payer: Medicaid Other | Admitting: Adult Health

## 2023-06-29 ENCOUNTER — Encounter: Payer: Self-pay | Admitting: Adult Health

## 2023-06-29 VITALS — BP 122/72 | HR 58 | Ht 74.0 in | Wt 225.0 lb

## 2023-06-29 DIAGNOSIS — F17210 Nicotine dependence, cigarettes, uncomplicated: Secondary | ICD-10-CM

## 2023-06-29 DIAGNOSIS — G4733 Obstructive sleep apnea (adult) (pediatric): Secondary | ICD-10-CM

## 2023-06-29 DIAGNOSIS — J432 Centrilobular emphysema: Secondary | ICD-10-CM

## 2023-06-29 NOTE — Patient Instructions (Addendum)
 Refer to Lung cancer CT chest screening program.  Continue on Dulera and Spiriva.  Albuterol inhaler As needed   Great job not smoking  Activity as tolerated.  Follow up with Dr. Marchelle Gearing in 6 months and As needed

## 2023-06-29 NOTE — Progress Notes (Signed)
 @Patient  ID: Todd Mercado, male    DOB: 1960-06-27, 63 y.o.   MRN: [id number]    Referring provider: Natalia Leatherwood, DO  HPI: 63 year old male former smoker followed for COPD and OSA  Alpha 1 MZ   TEST/EVENTS :  a split-night sleep study on 10 03/2016 that showed severe obstructive sleep apnea, AHI 54.4 an hour with SpO2 low 86%.    06/29/2023 Follow up: COPD and OSA  Patient presents for a 20-month follow-up.  Patient is followed for COPD.  Patient remains on Dulera and Spiriva.  Overall says that his breathing is doing about the same.  Does get short of breath with heavy activities.  No flare of cough or wheezing.  Has been able to quit smoking.  He was congratulated on successful cessation.  We did discuss the lung cancer CT screening program.  He is open to referral.  Patient has severe obstructive sleep apnea.  He is on CPAP at bedtime.  Wears a CPAP every single night.  Feels that he benefits from CPAP with decreased daytime sleepiness.  CPAP download shows excellent compliance with 100% usage.  Daily average usage at 7 hours.  Patient is on CPAP 11 cm H2O.  AHI 2.6/hour.     No Known Allergies  Immunization History  Administered Date(s) Administered   Influenza, Seasonal, Injecte, Preservative Fre 03/27/2023   Influenza,inj,Quad PF,6+ Mos 02/11/2013, 01/23/2016, 03/22/2018, 06/10/2019, 01/27/2020   Influenza-Unspecified 04/29/2021, 02/02/2022   PFIZER(Purple Top)SARS-COV-2 Vaccination 08/08/2019, 09/04/2019   Pfizer Covid-19 Vaccine Bivalent Booster 67yrs & up 04/29/2021   Pneumococcal Polysaccharide-23 02/11/2013   Tdap 02/17/2022   Zoster Recombinant(Shingrix) 02/17/2022    Past Medical History:  Diagnosis Date   Allergy    Anxiety 06/21/2018   Arthritis    Cigar smoker motivated to quit    Community acquired pneumonia of right lung 06/21/2020   COPD (chronic obstructive pulmonary disease) (HCC)    Current every day smoker 06/02/2019   Hypertension    Hypoxia  06/09/2020   OSA (obstructive sleep apnea) 2004   CPAP recommended- to meet with home health 02/26/16   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 90 tablet 1   gabapentin (NEURONTIN) 300 MG capsule Take 1 capsule (300 mg total) by mouth at bedtime. 30 capsule 3   meloxicam (MOBIC) 15 MG tablet ONE TAB BY MOUTH EVERY 24 HOURS WITH A MEAL FOR 2 WEEKS, THEN ONCE EVERY 24 HOURS AS NEEDED FOR PAIN 30 tablet 3   mometasone-formoterol (DULERA) 200-5 MCG/ACT AERO Inhale 2 puffs into the lungs 2 (two) times daily. 13 g 11   sildenafil (VIAGRA) 50 MG tablet Take 1 tablet (50 mg total) by mouth daily as needed for erectile dysfunction. 10 tablet 5   Tiotropium Bromide Monohydrate (SPIRIVA RESPIMAT) 2.5 MCG/ACT AERS Inhale 2 puffs into the lungs daily. 4 g 11   fluticasone furoate-vilanterol (BREO ELLIPTA) 200-25 MCG/ACT AEPB  Inhale 1 puff into the lungs daily.     amoxicillin-clavulanate (AUGMENTIN) 875-125 MG tablet Take 1 tablet by mouth 2 (two) times daily. 20 tablet 0   Tiotropium Bromide Monohydrate (SPIRIVA RESPIMAT) 2.5 MCG/ACT AERS Inhale 2 puffs into the lungs daily.     No facility-administered medications prior to visit.     Review of Systems:   Constitutional:   No  weight loss, night sweats,  Fevers, chills,+ fatigue, or  lassitude.  HEENT:   No headaches,  Difficulty swallowing,  Tooth/dental problems, or  Sore throat,                No sneezing, itching, ear  ache, nasal congestion, post nasal drip,   CV:  No chest pain,  Orthopnea, PND, swelling in lower extremities, anasarca, dizziness, palpitations, syncope.   GI  No heartburn, indigestion, abdominal pain, nausea, vomiting, diarrhea, change in bowel habits, loss of appetite, bloody stools.   Resp: .  No chest wall deformity  Skin: no rash or lesions.  GU: no dysuria, change in color of urine, no urgency or frequency.  No flank pain, no hematuria   MS:  No joint pain or swelling.  No decreased range of motion.  No back pain.    Physical Exam    GEN: A/Ox3; pleasant , NAD, well nourished    HEENT:  [place]/AT,  EACs-clear, TMs-wnl, NOSE-clear, THROAT-clear, no lesions, no postnasal drip or exudate noted.   NECK:  Supple w/ fair ROM; no JVD; normal carotid impulses w/o bruits; no thyromegaly or nodules palpated; no lymphadenopathy.    RESP  Clear  P & A; w/o, wheezes/ rales/ or rhonchi. no accessory muscle use, no dullness to percussion  CARD:  RRR, no m/r/g, no peripheral edema, pulses intact, no cyanosis or clubbing.  GI:   Soft & nt; nml bowel sounds; no organomegaly or masses detected.   Musco: Warm bil, no deformities or joint swelling noted.   Neuro: alert, no focal deficits noted.    Skin: Warm, no lesions or rashes    Lab Results:  CBC    Component Value Date/Time   WBC 7.4 12/06/2021 1436   RBC 4.90 12/06/2021 1436   HGB 15.2 12/06/2021 1436   HCT 44.2 12/06/2021 1436   HCT 44 12/25/2011 0000   PLT 156 12/06/2021 1436   MCV 90.2 12/06/2021 1436   MCH 31.0 12/06/2021 1436   MCHC 34.4 12/06/2021 1436   RDW 13.7 12/06/2021 1436   RDW 13.7 12/25/2011 0000   LYMPHSABS 1,303 10/03/2020 1601   MONOABS 0.5 02/25/2016 1452   EOSABS 140 10/03/2020 1601   EOSABS 0 12/25/2011 0000   BASOSABS 70 10/03/2020 1601    BMET    Component Value Date/Time   NA 137 12/06/2021 1436   NA 138 12/25/2011 0000   K 4.0 12/06/2021 1436   K 4.7 12/25/2011 0000   CL 97 (L)  12/06/2021 1436   CL 99 12/25/2011 0000   CO2 32 12/06/2021 1436   CO2 32 12/25/2011 0000   GLUCOSE 142 (H) 12/06/2021 1436   BUN 11 12/06/2021 1436   BUN 13 12/25/2011 0000   CREATININE 0.88 12/06/2021 1436   CALCIUM 9.5 12/06/2021 1436   CALCIUM 9.7 12/25/2011 0000   GFRNONAA >60 06/12/2020 0453   GFRAA >60 03/08/2016 0305    BNP No results found for: "BNP"  ProBNP No results found for: "PROBNP"  Imaging: No results found.  Administration History     None  Latest Ref Rng & Units 01/19/2023    3:06 PM 02/27/2016   11:33 AM  PFT Results  FVC-Pre L 3.47  4.00   FVC-Predicted Pre % 67  75   FVC-Post L 3.68  4.33   FVC-Predicted Post % 71  81   Pre FEV1/FVC % % 45  62   Post FEV1/FCV % % 45  57   FEV1-Pre L 1.57  2.47   FEV1-Predicted Pre % 40  60   FEV1-Post L 1.65  2.49   DLCO uncorrected ml/min/mmHg 18.81  27.40   DLCO UNC% % 63  78   DLCO corrected ml/min/mmHg  27.72   DLCO COR %Predicted %  79   DLVA Predicted % 77  81   TLC L 5.86  6.27   TLC % Predicted % 79  84   RV % Predicted % 78  100     No results found for: "NITRICOXIDE"      Assessment & Plan:   No problem-specific Assessment & Plan notes found for this encounter.     Rubye Oaks, NP 06/29/2023

## 2023-07-05 NOTE — Assessment & Plan Note (Signed)
 Compensated on present regimen.  Continue on Dulera and Spiriva.  Referred to the lung cancer CT screening program.  Congratulated on smoking cessation  Plan  Patient Instructions  Refer to Lung cancer CT chest screening program.  Continue on Dulera and Spiriva.  Albuterol inhaler As needed   Great job not smoking  Activity as tolerated.  Follow up with Dr. Marchelle Gearing in 6 months and As needed

## 2023-07-05 NOTE — Assessment & Plan Note (Signed)
 Excellent control and compliance on nocturnal CPAP.  Continue current settings

## 2023-07-15 DIAGNOSIS — G4733 Obstructive sleep apnea (adult) (pediatric): Secondary | ICD-10-CM | POA: Diagnosis not present

## 2023-07-31 ENCOUNTER — Telehealth: Payer: Self-pay

## 2023-07-31 DIAGNOSIS — Z87891 Personal history of nicotine dependence: Secondary | ICD-10-CM

## 2023-07-31 DIAGNOSIS — Z122 Encounter for screening for malignant neoplasm of respiratory organs: Secondary | ICD-10-CM

## 2023-07-31 NOTE — Telephone Encounter (Signed)
 Lung Cancer Screening Narrative/Criteria Questionnaire (Cigarette Smokers Only- No Cigars/Pipes/vapes)   Todd Mercado   SDMV:08/31/2023 at 4:00 pm         July 13, 1960   LDCT: 09/01/2023 at 4:00 pm DRI Theotis Burrow    63 y.o.   Phone: [phone]  Lung Screening Narrative (confirm age 38-77 yrs Medicare / 50-80 yrs Private pay insurance)   Insurance information: Medicaid   Referring Provider: Clent Ridges, NP   This screening involves an initial phone call with a team member from our program. It is called a shared decision making visit. The initial meeting is required by  insurance and Medicare to make sure you understand the program. This appointment takes about 15-20 minutes to complete. You will complete the screening scan at your scheduled date/time.  This scan takes about 5-10 minutes to complete. You can eat and drink normally before and after the scan.  Criteria questions for Lung Cancer Screening:   Are you a current or former smoker? Former Age began smoking: 14   If you are a former smoker, what year did you quit smoking? Quit 02/2023 (within 15 yrs)   To calculate your smoking history, I need an accurate estimate of how many packs of cigarettes you smoked per day and for how many years. (Not just the number of PPD you are now smoking)   Years smoking 48 x Packs per day 2 = Pack years 96   (at least 20 pack yrs)   (Make sure they understand that we need to know how much they have smoked in the past, not just the number of PPD they are smoking now)  Do you have a personal history of cancer?  No    Do you have a family history of cancer? No  Are you coughing up blood?  No  Have you had unexplained weight loss of 15 lbs or more in the last 6 months? No  It looks like you meet all criteria.  When would be a good time for [place] to schedule you for this screening?   Additional information: N/A

## 2023-08-15 DIAGNOSIS — G4733 Obstructive sleep apnea (adult) (pediatric): Secondary | ICD-10-CM | POA: Diagnosis not present

## 2023-08-30 ENCOUNTER — Other Ambulatory Visit: Payer: Self-pay | Admitting: Sports Medicine

## 2023-08-30 DIAGNOSIS — M7542 Impingement syndrome of left shoulder: Secondary | ICD-10-CM

## 2023-08-31 ENCOUNTER — Ambulatory Visit (INDEPENDENT_AMBULATORY_CARE_PROVIDER_SITE_OTHER): Admitting: Acute Care

## 2023-08-31 DIAGNOSIS — Z87891 Personal history of nicotine dependence: Secondary | ICD-10-CM | POA: Diagnosis not present

## 2023-08-31 NOTE — Progress Notes (Addendum)
 Virtual Visit via Telephone Note  I connected with [name] on 08/31/23 at  4:00 PM EDT by telephone and verified that I am speaking with the correct person using two identifiers.  Location: Patient: Todd Mercado Provider: Alyse Bach, RN   I discussed the limitations, risks, security and privacy concerns of performing an evaluation and management service by telephone and the availability of in person appointments. I also discussed with the patient that there may be a patient responsible charge related to this service. The patient expressed understanding and agreed to proceed.   Shared Decision Making Visit Lung Cancer Screening Program [id number])   Eligibility: Age 63 y.o. Pack Years Smoking History Calculation 95 (# packs/per year x # years smoked) Recent History of coughing up blood  no Unexplained weight loss? no ( >Than 15 pounds within the last 6 months ) Prior History Lung / other cancer no (Diagnosis within the last 5 years already requiring surveillance chest CT Scans). Smoking Status Former Smoker Former Smokers: Years since quit: < 1 year  Quit Date: 02/2023  Visit Components: Discussion included one or more decision making aids. yes Discussion included risk/benefits of screening. yes Discussion included potential follow up diagnostic testing for abnormal scans. yes Discussion included meaning and risk of over diagnosis. yes Discussion included meaning and risk of False Positives. yes Discussion included meaning of total radiation exposure. yes  Counseling Included: Importance of adherence to annual lung cancer LDCT screening. yes Impact of comorbidities on ability to participate in the program. yes Ability and willingness to under diagnostic treatment. yes  Smoking Cessation Counseling: Current Smokers:  Discussed importance of smoking cessation. yes Information about tobacco cessation classes and interventions provided to patient. yes Patient provided  with "ticket" for LDCT Scan. no Symptomatic Patient. no  Counseling(Intermediate counseling: > three minutes) 99406 Diagnosis Code: Tobacco Use Z72.0 Asymptomatic Patient yes  Counseling (Intermediate counseling: > three minutes counseling) U0454 Former Smokers:  Discussed the importance of maintaining cigarette abstinence. yes Diagnosis Code: Personal History of Nicotine  Dependence. U98.119 Information about tobacco cessation classes and interventions provided to patient. Yes Patient provided with "ticket" for LDCT Scan. no Written Order for Lung Cancer Screening with LDCT placed in Epic. Yes (CT Chest Lung Cancer Screening Low Dose W/O CM) [id number] Z12.2-Screening of respiratory organs Z87.891-Personal history of nicotine  dependence   Alyse Bach, RN   I agree with the documentation of the Shared Decision Making visit,  smoking cessation counseling if appropriate, and verification or eligibility for lung cancer screening as documented by the RN Nurse Navigator.   Raejean Bullock, MSN, AGACNP-BC [facility] See Amion for personal pager PCCM on call pager [phone]

## 2023-08-31 NOTE — Patient Instructions (Signed)

## 2023-09-01 ENCOUNTER — Ambulatory Visit
Admission: RE | Admit: 2023-09-01 | Discharge: 2023-09-01 | Disposition: A | Discharge status: Home / Self Care | Source: Ambulatory Visit | Attending: Family Medicine | Admitting: Family Medicine

## 2023-09-01 DIAGNOSIS — Z87891 Personal history of nicotine dependence: Secondary | ICD-10-CM

## 2023-09-01 DIAGNOSIS — Z122 Encounter for screening for malignant neoplasm of respiratory organs: Secondary | ICD-10-CM

## 2023-09-14 DIAGNOSIS — G4733 Obstructive sleep apnea (adult) (pediatric): Secondary | ICD-10-CM | POA: Diagnosis not present

## 2023-09-25 ENCOUNTER — Other Ambulatory Visit: Payer: Self-pay

## 2023-09-25 DIAGNOSIS — F17210 Nicotine dependence, cigarettes, uncomplicated: Secondary | ICD-10-CM

## 2023-09-25 DIAGNOSIS — Z122 Encounter for screening for malignant neoplasm of respiratory organs: Secondary | ICD-10-CM

## 2023-09-25 DIAGNOSIS — Z87891 Personal history of nicotine dependence: Secondary | ICD-10-CM

## 2023-10-01 ENCOUNTER — Telehealth: Payer: Self-pay

## 2023-10-01 NOTE — Telephone Encounter (Signed)
 Returned call to review recent lung CT results. No answer or VM setup. Result letter sent to patient 09/25/2023. He will complete an anuual scan again next year. I will review patient results at call back.

## 2023-10-01 NOTE — Telephone Encounter (Signed)
 Spoke with patient and reviwed results. He will complete his annual Lung CT again next year. Results and plan to PCP.

## 2023-10-15 DIAGNOSIS — G4733 Obstructive sleep apnea (adult) (pediatric): Secondary | ICD-10-CM | POA: Diagnosis not present

## 2023-10-18 ENCOUNTER — Other Ambulatory Visit: Payer: Self-pay | Admitting: Sports Medicine

## 2023-10-18 DIAGNOSIS — M7542 Impingement syndrome of left shoulder: Secondary | ICD-10-CM

## 2023-11-02 ENCOUNTER — Ambulatory Visit (INDEPENDENT_AMBULATORY_CARE_PROVIDER_SITE_OTHER): Payer: Medicaid Other | Admitting: Family Medicine

## 2023-11-02 ENCOUNTER — Encounter: Payer: Self-pay | Admitting: Family Medicine

## 2023-11-02 VITALS — BP 126/72 | HR 61 | Temp 98.1°F | Wt 225.0 lb

## 2023-11-02 DIAGNOSIS — J432 Centrilobular emphysema: Secondary | ICD-10-CM

## 2023-11-02 DIAGNOSIS — Z87891 Personal history of nicotine dependence: Secondary | ICD-10-CM | POA: Insufficient documentation

## 2023-11-02 DIAGNOSIS — N528 Other male erectile dysfunction: Secondary | ICD-10-CM

## 2023-11-02 DIAGNOSIS — I700 Atherosclerosis of aorta: Secondary | ICD-10-CM

## 2023-11-02 DIAGNOSIS — I10 Essential (primary) hypertension: Secondary | ICD-10-CM | POA: Diagnosis not present

## 2023-11-02 DIAGNOSIS — Z23 Encounter for immunization: Secondary | ICD-10-CM

## 2023-11-02 DIAGNOSIS — G4733 Obstructive sleep apnea (adult) (pediatric): Secondary | ICD-10-CM

## 2023-11-02 DIAGNOSIS — Z1211 Encounter for screening for malignant neoplasm of colon: Secondary | ICD-10-CM

## 2023-11-02 MED ORDER — AMLODIPINE BESYLATE 10 MG PO TABS: 10.0000 mg | ORAL_TABLET | Freq: Every day | ORAL | 1 refills | Status: DC

## 2023-11-02 MED ORDER — ALBUTEROL SULFATE HFA 108 (90 BASE) MCG/ACT IN AERS: INHALATION_SPRAY | RESPIRATORY_TRACT | 11 refills | Status: DC

## 2023-11-02 MED ORDER — SILDENAFIL CITRATE 50 MG PO TABS: 50.0000 mg | ORAL_TABLET | Freq: Every day | ORAL | 5 refills | Status: AC | PRN

## 2023-11-02 NOTE — Progress Notes (Signed)
 Patient ID: Todd Mercado, male  DOB: 03-17-1961, 63 y.o.   MRN: [id number] Patient Care Team    Relationship Specialty Notifications Start End  Catherine Charlies LABOR, DO PCP - General Family Medicine  07/02/15   Shari Sieving, MD Consulting Physician Orthopedic Surgery  09/04/17   Geronimo Amel, MD Consulting Physician Pulmonary Disease  11/02/23     Chief Complaint  Patient presents with   Hypertension    Subjective: Todd Mercado is a 63 y.o. male present for Chronic Conditions/illness Management  All past medical history, surgical history, allergies, family history, immunizations, medications and social history were updated in the electronic medical record today. All recent labs, ED visits and hospitalizations within the last year were reviewed.  Hypertension/tobacco use-former: Pt reports compliance with amlodipine  10 mg daily.  Patient denies chest pain, shortness of breath, dizziness or lower extremity edema.  Pt does not take a daily baby ASA. Pt is not prescribed statin.  He continues to smoke daily about 1 cig. A day.  Diet: No routine diet Exercise: No routine exercise RF: Hypertension, overweight, smoker, family history present  OSA/pulmonary emphysema: Patient reports he is compliance with his CPAP.   He is compliant t with his Breo. Needs PA now that he has new insurance.    Erectile dysfunction: Patient reports he has had erectile dysfunction for at least 2 to 3 years.  He reports he has quit smoking and he has quit drinking.  However alcohol cessation did not improve function.  He is compliant with his CPAP.  He reports he is able to have an erection, he is having difficulty sustaining erection. Viagra  is working well and he reports it works good for him without side effects.      11/14/2022    2:05 PM 05/14/2022    3:33 PM 06/13/2021    8:48 AM 06/21/2020   10:55 AM 01/27/2020   10:43 AM  Depression screen PHQ 2/9  Decreased Interest 2 0 0 0 0  Down, Depressed,  Hopeless 0 0 0 0 0  PHQ - 2 Score 2 0 0 0 0  Altered sleeping 1    3  Tired, decreased energy 2    2  Change in appetite     0  Feeling bad or failure about yourself  0    0  Trouble concentrating 3    0  Moving slowly or fidgety/restless 0    0  Suicidal thoughts 0    0  PHQ-9 Score 8    5  Difficult doing work/chores Not difficult at all          11/14/2022    2:06 PM 01/27/2020   10:44 AM 12/14/2018    1:05 PM  GAD 7 : Generalized Anxiety Score  Nervous, Anxious, on Edge 0 2 1  Control/stop worrying 0 0 1  Worry too much - different things 0 0 0  Trouble relaxing 1 2 2   Restless 0 0 0  Easily annoyed or irritable 1 2 1   Afraid - awful might happen 0 0 0  Total GAD 7 Score 2 6 5   Anxiety Difficulty Not difficult at all  Somewhat difficult           11/14/2022    2:05 PM 06/21/2018   12:27 PM  Fall Risk   Falls in the past year? 0 0   Number falls in past yr: 0 0   Injury with Fall? 0 0  Follow up Falls  evaluation completed Falls evaluation completed      Data saved with a previous flowsheet row definition    Immunization History  Administered Date(s) Administered   Influenza, Seasonal, Injecte, Preservative Fre 03/27/2023   Influenza,inj,Quad PF,6+ Mos 02/11/2013, 01/23/2016, 03/22/2018, 06/10/2019, 01/27/2020   Influenza-Unspecified 04/29/2021, 02/02/2022   PFIZER(Purple Top)SARS-COV-2 Vaccination 08/08/2019, 09/04/2019   PNEUMOCOCCAL CONJUGATE-20 11/02/2023   Pfizer Covid-19 Vaccine Bivalent Booster 6yrs & up 04/29/2021   Pneumococcal Polysaccharide-23 02/11/2013   Tdap 02/17/2022   Zoster Recombinant(Shingrix) 02/17/2022     Past Medical History:  Diagnosis Date   Allergy    Anxiety 06/21/2018   Arthritis    Cigar smoker motivated to quit    Community acquired pneumonia of right lung 06/21/2020   COPD (chronic obstructive pulmonary disease) (HCC)    Current every day smoker 06/02/2019   Hypertension    Hypoxia 06/09/2020   OSA (obstructive sleep apnea)  2004   CPAP recommended- to meet with home health 02/26/16   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   Wears dentures    No Known Allergies Past Surgical History:  Procedure Laterality Date   DENTAL SURGERY     TOTAL KNEE ARTHROPLASTY Right 03/07/2016   Procedure: TOTAL KNEE ARTHROPLASTY;  Surgeon: Toribio Silos, MD;  Location: [facility];  Service: Orthopedics;  Laterality: Right;   Family History  Problem Relation Age of Onset   Hypertension Mother    COPD Mother    Arthritis Mother    Heart disease Mother        atrial fibrillation   Atrial fibrillation Mother    Emphysema Father    Early death Father    Arthritis Maternal Aunt    Diabetes Maternal Aunt    COPD Maternal Uncle    Heart failure Maternal Grandfather    Social History   Social History Narrative   Todd Mercado is divorced. He has 3 children ([place], Camie and [place])   HS education, employed as a Media planner, operates heavy machinery   Heavy tobacco use > 45 pack year history, started smoking at age 6.    Alcohol use. No drug use.    Wears his seatbelt, smoke detector in the home.   wears dentures   Feels safe in relationships.     Allergies as of 11/02/2023   No Known Allergies      Medication List        Accurate as of November 02, 2023  1:44 PM. If you have any questions, ask your nurse or doctor.          albuterol  108 (90 Base) MCG/ACT inhaler Commonly known as: VENTOLIN  HFA Use as directed   amLODipine  10 MG tablet Commonly known as: NORVASC  Take 1 tablet (10 mg total) by mouth daily.   Dulera  200-5 MCG/ACT Aero Generic drug: mometasone-formoterol  Inhale 2 puffs into the lungs 2 (two) times daily.   meloxicam  15 MG tablet Commonly known as: MOBIC  TAKE ONE TABLET BY MOUTH EVERY 24 HOURS WITH A MEAL FOR 2 WEEKS, THEN ONCE EVERY 24 HOURS AS NEEDED FOR PAIN (NEEDS APPOINTMENT FOR FURTHER REFILLS)   sildenafil  50 MG tablet Commonly known as: Viagra  Take 1 tablet (50 mg total) by mouth daily as needed for  erectile dysfunction.   Spiriva  Respimat 2.5 MCG/ACT Aers Generic drug: Tiotropium Bromide Monohydrate  Inhale 2 puffs into the lungs daily.       All past medical history, surgical history, allergies, family history, immunizations andmedications were updated in the EMR today and reviewed under  the history and medication portions of their EMR.      ROS 14 pt review of systems performed and negative (unless mentioned in an HPI)  Objective: BP 126/72   Pulse 61   Temp 98.1 F (36.7 C)   Wt 225 lb (102.1 kg)   SpO2 95%   BMI 28.89 kg/m  Physical Exam Vitals and nursing note reviewed.  Constitutional:      General: He is not in acute distress.    Appearance: Normal appearance. He is not ill-appearing, toxic-appearing or diaphoretic.  HENT:     Head: Normocephalic and atraumatic.   Eyes:     General: No scleral icterus.       Right eye: No discharge.        Left eye: No discharge.     Extraocular Movements: Extraocular movements intact.     Pupils: Pupils are equal, round, and reactive to light.    Cardiovascular:     Rate and Rhythm: Normal rate and regular rhythm.  Pulmonary:     Effort: Pulmonary effort is normal. No respiratory distress.     Breath sounds: Normal breath sounds. No wheezing, rhonchi or rales.   Musculoskeletal:     Right lower leg: No edema.     Left lower leg: No edema.   Skin:    General: Skin is warm.     Findings: No rash.   Neurological:     Mental Status: He is alert and oriented to person, place, and time. Mental status is at baseline.   Psychiatric:        Mood and Affect: Mood normal.        Behavior: Behavior normal.        Thought Content: Thought content normal.        Judgment: Judgment normal.    No results found.  Assessment/plan: Todd Mercado is a 63 y.o. male present for Chronic Conditions/illness Management Essential hypertension, benign/Morbid obesity (HCC) Stable Continue amlodipine  10 mg QD . tried meds:   lisinopril - cough.  Low sodium Diet, exercise Coronary cardiac score ZERO (12/02/2022)  OSA (obstructive sleep apnea)/Pulmonary emphysema, unspecified emphysema type (HCC We discussed referral back to pulmonology for further evaluation on both his COPD and his OSA.  Patient is agreeable to this today.** Continue dulera  Continue Spiriva  Respimat Continue albuterol  as needed -Compliant with CPAP   Erectile dysfunction: Discussed proper use of sildenafil /Viagra  like product with him today. Discussed emergent precautions if dizziness or chest pain is experienced with use. Continue Viagra  50 mg daily as needed  Pna vacc completed today  Return in about 24 weeks (around 04/18/2024).   Orders Placed This Encounter  Procedures   Pneumococcal conjugate vaccine 20-valent (Prevnar 20)   Meds ordered this encounter  Medications   amLODipine  (NORVASC ) 10 MG tablet    Sig: Take 1 tablet (10 mg total) by mouth daily.    Dispense:  90 tablet    Refill:  1   albuterol  (VENTOLIN  HFA) 108 (90 Base) MCG/ACT inhaler    Sig: Use as directed    Dispense:  8 g    Refill:  11   sildenafil  (VIAGRA ) 50 MG tablet    Sig: Take 1 tablet (50 mg total) by mouth daily as needed for erectile dysfunction.    Dispense:  10 tablet    Refill:  5    May dispense in 90d if insurance allows. W/ 1 refll   Referral Orders  No referral(s) requested today  Note is dictated utilizing voice recognition software. Although note has been proof read prior to signing, occasional typographical errors still can be missed. If any questions arise, please do not hesitate to call for verification.  Electronically signed by: Charlies Bellini, DO  Primary [facility]- [place]

## 2023-11-02 NOTE — Patient Instructions (Addendum)

## 2023-11-03 ENCOUNTER — Other Ambulatory Visit: Payer: Self-pay

## 2023-11-03 MED ORDER — ALBUTEROL SULFATE HFA 108 (90 BASE) MCG/ACT IN AERS: 1.0000 | INHALATION_SPRAY | Freq: Four times a day (QID) | RESPIRATORY_TRACT | 11 refills | Status: AC | PRN

## 2023-11-26 ENCOUNTER — Other Ambulatory Visit: Payer: Self-pay | Admitting: Family Medicine

## 2023-11-28 ENCOUNTER — Other Ambulatory Visit: Payer: Self-pay | Admitting: Sports Medicine

## 2023-11-28 DIAGNOSIS — M7542 Impingement syndrome of left shoulder: Secondary | ICD-10-CM

## 2023-11-29 ENCOUNTER — Other Ambulatory Visit: Admitting: Family Medicine

## 2023-11-30 ENCOUNTER — Telehealth: Payer: Self-pay

## 2023-11-30 ENCOUNTER — Other Ambulatory Visit (HOSPITAL_COMMUNITY): Payer: Self-pay

## 2023-11-30 NOTE — Telephone Encounter (Signed)
 Received message from pharmacy that a PA was needed for      DULERA  200 MCG-5 MCG INHALER      Please assist with PA

## 2023-12-01 ENCOUNTER — Other Ambulatory Visit (HOSPITAL_COMMUNITY): Payer: Self-pay

## 2023-12-01 NOTE — Telephone Encounter (Signed)
 Patient needs contacted to verify insurance coverage.  I am unable to call in a different inhaler at this time that will be covered until I know what his insurance plans formulary in system for options.  If he is currently not insured, and he would need to pay out-of-pocket for his Dulera 

## 2023-12-02 MED ORDER — FLUTICASONE-SALMETEROL 232-14 MCG/ACT IN AEPB: 1.0000 | INHALATION_SPRAY | Freq: Two times a day (BID) | RESPIRATORY_TRACT | 11 refills | Status: DC

## 2023-12-02 NOTE — Telephone Encounter (Signed)
 Spoke with pt and pharmacy. He does have insurance but the Dulera  is not covered. Pharmacy states [employer] and [employer] are covered. Please advise

## 2023-12-02 NOTE — Telephone Encounter (Signed)
 Please inform patient I called in the air duo inhaler.  It is 1 puff twice a day.

## 2023-12-02 NOTE — Telephone Encounter (Signed)
LVM & sent MyChart

## 2023-12-02 NOTE — Telephone Encounter (Signed)
 Lvm for patient to call back regarding current insurance. Is he covered by a new insurance?  [employer] Medicaid states coverage ended on 11/02/2023.

## 2023-12-02 NOTE — Telephone Encounter (Signed)
 SEE PREVIOUS ENCOUNTER REGARDING PA

## 2023-12-02 NOTE — Addendum Note (Signed)
 Addended by: Cruz Bong A on: 12/02/2023 03:41 PM   Modules accepted: Orders

## 2023-12-03 ENCOUNTER — Other Ambulatory Visit: Payer: Self-pay | Admitting: Family Medicine

## 2023-12-07 ENCOUNTER — Other Ambulatory Visit: Payer: Self-pay | Admitting: Family Medicine

## 2023-12-07 MED ORDER — AIRDUO DIGIHALER 113-14 MCG/ACT IN AEPB
2.0000 | INHALATION_SPRAY | Freq: Two times a day (BID) | RESPIRATORY_TRACT | 11 refills | Status: AC
Start: 2023-12-07 — End: ?

## 2023-12-07 NOTE — Telephone Encounter (Signed)
 I have called in yet another inhaler for patient.  Hopefully this time it is covered and it is not on backorder and the pharmacy has it in stock

## 2023-12-09 ENCOUNTER — Telehealth: Payer: Self-pay

## 2023-12-09 NOTE — Telephone Encounter (Signed)
 Copied from CRM #[id number]. Topic: Clinical - Medication Prior Auth >> Dec 09, 2023 11:57 AM Joesph PARAS wrote: Reason for CRM: Patient needs PA for DULERA  200 MCG-5 MCG INHALER. PA appears to be going through primary care. Patient requested we send rx for medication in but explained that pharmacy will not fill it until PA is complete. Patient states will investigate with primary care.   PCP Rx not us     NFN-

## 2023-12-10 ENCOUNTER — Other Ambulatory Visit: Payer: Self-pay | Admitting: Sports Medicine

## 2023-12-10 ENCOUNTER — Telehealth: Payer: Self-pay

## 2023-12-10 DIAGNOSIS — M7542 Impingement syndrome of left shoulder: Secondary | ICD-10-CM

## 2023-12-10 NOTE — Telephone Encounter (Signed)
Spoke with pt. Pt made aware.

## 2023-12-10 NOTE — Telephone Encounter (Signed)
 Communication  Reason for CRM: Patient would like to know the time frame for the prior authorization to take to get approved due to having COPD pretty bad and can't function without medication. Please call [phone]    LVM to discuss. Inhaler was sent in for pt on 8/4.

## 2024-01-05 ENCOUNTER — Encounter: Payer: Self-pay | Admitting: Sports Medicine

## 2024-01-11 ENCOUNTER — Other Ambulatory Visit: Payer: Self-pay

## 2024-01-11 DIAGNOSIS — M7542 Impingement syndrome of left shoulder: Secondary | ICD-10-CM

## 2024-01-11 MED ORDER — MELOXICAM 15 MG PO TABS: ORAL_TABLET | ORAL | 0 refills | Status: DC

## 2024-01-13 ENCOUNTER — Other Ambulatory Visit: Payer: Self-pay | Admitting: Internal Medicine

## 2024-01-26 ENCOUNTER — Other Ambulatory Visit: Payer: Self-pay | Admitting: Family Medicine

## 2024-01-26 DIAGNOSIS — M7542 Impingement syndrome of left shoulder: Secondary | ICD-10-CM

## 2024-02-11 ENCOUNTER — Other Ambulatory Visit: Payer: Self-pay | Admitting: Family Medicine

## 2024-02-18 ENCOUNTER — Other Ambulatory Visit: Payer: Self-pay | Admitting: Family Medicine

## 2024-02-18 DIAGNOSIS — M7542 Impingement syndrome of left shoulder: Secondary | ICD-10-CM

## 2024-02-19 NOTE — Telephone Encounter (Signed)
 Refilled Mobic  for pt, initially prescribed by Dr. ONEIDA- which is no longer located in [place].

## 2024-02-29 ENCOUNTER — Other Ambulatory Visit: Payer: Self-pay | Admitting: Family Medicine

## 2024-04-25 ENCOUNTER — Ambulatory Visit: Admitting: Family Medicine

## 2024-04-25 ENCOUNTER — Encounter: Payer: Self-pay | Admitting: Family Medicine

## 2024-04-25 VITALS — BP 128/82 | HR 71 | Temp 98.3°F | Wt 235.0 lb

## 2024-04-25 DIAGNOSIS — M542 Cervicalgia: Secondary | ICD-10-CM | POA: Diagnosis not present

## 2024-04-25 DIAGNOSIS — Z23 Encounter for immunization: Secondary | ICD-10-CM

## 2024-04-25 DIAGNOSIS — M47812 Spondylosis without myelopathy or radiculopathy, cervical region: Secondary | ICD-10-CM | POA: Diagnosis not present

## 2024-04-25 LAB — BASIC METABOLIC PANEL WITH GFR
BUN: 17 mg/dL (ref 6–23)
CO2: 32 meq/L (ref 19–32)
Calcium: 9.1 mg/dL (ref 8.4–10.5)
Chloride: 98 meq/L (ref 96–112)
Creatinine, Ser: 0.98 mg/dL (ref 0.40–1.50)
GFR: 83.39 mL/min
Glucose, Bld: 100 mg/dL — ABNORMAL HIGH (ref 70–99)
Potassium: 4.9 meq/L (ref 3.5–5.1)
Sodium: 135 meq/L (ref 135–145)

## 2024-04-25 MED ORDER — METHYLPREDNISOLONE 4 MG PO TBPK: ORAL_TABLET | ORAL | 0 refills | Status: DC

## 2024-04-25 MED ORDER — METHOCARBAMOL 500 MG PO TABS: 500.0000 mg | ORAL_TABLET | Freq: Three times a day (TID) | ORAL | 0 refills | Status: AC | PRN

## 2024-04-25 NOTE — Patient Instructions (Addendum)
 Return in about 3 weeks (around 05/16/2024) for neck pain. Sooner if symptoms worsening        Great to see you today.  I have refilled the medication(s) we provide.   If labs were collected or images ordered, we will inform you of  results once we have received them and reviewed. We will contact you either by echart message, or telephone call.  Please give ample time to the testing facility, and our office to run,  receive and review results. Please do not call inquiring of results, even if you can see them in your chart. We will contact you as soon as we are able. If it has been over 1 week since the test was completed, and you have not yet heard from us , then please call us .    - echart message- for normal results that have been seen by the patient already.   - telephone call: abnormal results or if patient has not viewed results in their echart.  If a referral to a specialist was entered for you, please call us  in 2 weeks if you have not heard from the specialist office to schedule.

## 2024-04-25 NOTE — Progress Notes (Signed)
 "      Todd Mercado , [birth year], 63 y.o., male MRN: [id number] Patient Care Team    Relationship Specialty Notifications Start End  Catherine Charlies LABOR, DO PCP - General Family Medicine  07/02/15   Shari Sieving, MD Consulting Physician Orthopedic Surgery  09/04/17   Geronimo Amel, MD Consulting Physician Pulmonary Disease  11/02/23     Chief Complaint  Patient presents with   Neck Pain    3 months. R sided, spreads down right arm. Tingling, pain.      Subjective: Todd Mercado is a 63 y.o. Pt presents for an OV with complaints of right arm and right neck pain of 3 months duration.  Associated symptoms include radiation of pain down right arm. Pt has tried over-the-counter NSAIDs to ease their symptoms.  We had been evaluated last year of her shoulder complaint through sports medicine.  Todd Mercado states it is worsening over the last 3 months and causing him daily discomfort with numbness and tingling in his fingertips.    01/02/2023 EXAM: CERVICAL SPINE - COMPLETE 4+ VIEW FINDINGS: The cervical spine is visualized from C1-C6. Trace retrolisthesis with uncovertebral hypertrophy at C4-5 and C5-6. Vertebral body heights are maintained: no evidence of acute fracture. Moderate intervertebral disc space height loss at C4-5 and C5-6. Multilevel osseous neuroforaminal narrowing in the mid cervical spine secondary to facet arthropathy and uncovertebral hypertrophy. This is most pronounced at bilateral C4-5 and C5-6. No prevertebral soft tissue swelling. Visualized thorax is unremarkable.   IMPRESSION: Multilevel degenerative changes of the cervical spine most pronounced at C4-5 and C5-6.     11/02/2023   11:15 AM 11/02/2023   11:10 AM 11/14/2022    2:05 PM 05/14/2022    3:33 PM 06/13/2021    8:48 AM  Depression screen PHQ 2/9  Decreased Interest 0 0 2 0 0  Down, Depressed, Hopeless 0 0 0 0 0  PHQ - 2 Score 0 0 2 0 0  Altered sleeping   1    Tired, decreased energy   2    Feeling bad or  failure about yourself    0    Trouble concentrating   3    Moving slowly or fidgety/restless   0    Suicidal thoughts   0    PHQ-9 Score   8     Difficult doing work/chores   Not difficult at all       Data saved with a previous flowsheet row definition    Allergies[1] Social History   Social History Narrative   Mr. Cihlar is divorced. Todd Mercado has 3 children (Dalton, Camie and Urania)   HS education, employed as a media planner, operates heavy machinery   Heavy tobacco use > 45 pack year history, started smoking at age 48.    Alcohol use. No drug use.    Wears his seatbelt, smoke detector in the home.   wears dentures   Feels safe in relationships.    Past Medical History:  Diagnosis Date   Allergy    Anxiety 06/21/2018   Arthritis    Cigar smoker motivated to quit    Community acquired pneumonia of right lung 06/21/2020   COPD (chronic obstructive pulmonary disease) (HCC)    Current every day smoker 06/02/2019   History of ETOH abuse 03/11/2013   H/o drinking 6 beers daily. Now drinks a few on weekends-may drink 6.     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   Wears dentures    Past Surgical History:  Procedure Laterality Date   DENTAL SURGERY     TOTAL KNEE ARTHROPLASTY Right 03/07/2016   Procedure: TOTAL KNEE ARTHROPLASTY;  Surgeon: Toribio Silos, MD;  Location: [facility] OR;  Service: Orthopedics;  Laterality: Right;   Family History  Problem Relation Age of Onset   Hypertension Mother    COPD Mother    Arthritis Mother    Heart disease Mother        atrial fibrillation   Atrial fibrillation Mother    Emphysema Father    Early death Father    Arthritis Maternal Aunt    Diabetes Maternal Aunt    COPD Maternal Uncle    Heart failure Maternal Grandfather    Allergies as of 04/25/2024   No Known Allergies      Medication List        Accurate as of  April 25, 2024  4:04 PM. If you have any questions, ask your nurse or doctor.          AirDuo Digihaler  113-14 MCG/ACT Aepb Generic drug: Fluticasone -Salmeterol(sensor) Inhale 2 Inhalations into the lungs 2 (two) times daily.   albuterol  108 (90 Base) MCG/ACT inhaler Commonly known as: VENTOLIN  HFA Inhale 1-2 puffs into the lungs every 6 (six) hours as needed for wheezing or shortness of breath.   amLODipine  10 MG tablet Commonly known as: NORVASC  Take 1 tablet (10 mg total) by mouth daily.   meloxicam  15 MG tablet Commonly known as: MOBIC  NEED AN APPOINTMENT WITH [facility] FOR FURTHER REFILLS.TAKE ONCE EVERY 24 HOURS AS NEEDED FOR PAIN   methocarbamol  500 MG tablet Commonly known as: ROBAXIN  Take 1 tablet (500 mg total) by mouth every 8 (eight) hours as needed for muscle spasms. Started by: Charlies Bellini, DO   methylPREDNISolone  4 MG Tbpk tablet Commonly known as: MEDROL  DOSEPAK Instructions per dose pak Started by: Charlies Bellini, DO   sildenafil  50 MG tablet Commonly known as: Viagra  Take 1 tablet (50 mg total) by mouth daily as needed for erectile dysfunction.   Spiriva  Respimat 2.5 MCG/ACT Aers Generic drug: Tiotropium Bromide INHALE 2 PUFFS BY MOUTH INTO THE LUNGS DAILY        All past medical history, surgical history, allergies, family history, immunizations andmedications were updated in the EMR today and reviewed under the history and medication portions of their EMR.     ROS Negative, with the exception of above mentioned in HPI   Objective:  BP 128/82   Pulse 71   Temp 98.3 F (36.8 C)   Wt 235 lb (106.6 kg)   SpO2 95%   BMI 30.17 kg/m  Body mass index is 30.17 kg/m. Physical Exam Vitals and nursing note reviewed. Exam conducted with a chaperone present.  Constitutional:      General: Todd Mercado is not in acute distress.    Appearance: Normal appearance. Todd Mercado is not ill-appearing, toxic-appearing or diaphoretic.  HENT:     Head: Normocephalic and  atraumatic.  Eyes:     General: No scleral icterus.       Right eye: No discharge.        Left eye: No discharge.     Extraocular Movements: Extraocular movements intact.     Pupils: Pupils are equal, round, and reactive to light.  Musculoskeletal:     Cervical back: Spasms, tenderness and crepitus present. No swelling, erythema, rigidity, torticollis or  bony tenderness. Pain with movement present. Decreased range of motion.     Comments: Cervical spine: Decreased range of motion.  Tenderness and ropiness right paraspinal muscles.  Numbness and tingling fingertips of right hand.  Vascularly intact distally  Skin:    General: Skin is warm and dry.     Coloration: Skin is not jaundiced or pale.     Findings: No rash.  Neurological:     Mental Status: Todd Mercado is alert and oriented to person, place, and time. Mental status is at baseline.  Psychiatric:        Mood and Affect: Mood normal.        Behavior: Behavior normal.        Thought Content: Thought content normal.        Judgment: Judgment normal.      No results found. No results found. No results found for this or any previous visit (from the past 24 hours).  Assessment/Plan: Hakeen Shipes is a 63 y.o. male present for OV for  Neck pain (Primary)/Cervical spondylosis Worsening neck pain greater than 3 months duration not responding to supportive OTC measures.  Pain is now radiating down bicep and into fingers causing numbness and tingling in his fingers.  Need to move forward with advanced imaging to better evaluate her impingement rule out stenosis.  Patient is in agreement this plan. - MR Cervical Spine Wo Contrast; Future - methylPREDNISolone  (MEDROL  DOSEPAK) 4 MG TBPK tablet; Instructions per dose pak  Dispense: 21 each; Refill: 0 - methocarbamol  (ROBAXIN ) 500 MG tablet; Take 1 tablet (500 mg total) by mouth every 8 (eight) hours as needed for muscle spasms.  Dispense: 90 tablet; Refill: 0 - Basic Metabolic Panel  (BMET) -Follow-up in 3 weeks  Need for influenza vaccination - Flu vaccine trivalent PF, 6mos and older(Flulaval,Afluria,Fluarix,Fluzone) Administered today  Reviewed expectations re: course of current medical issues. Discussed self-management of symptoms. Outlined signs and symptoms indicating need for more acute intervention. Patient verbalized understanding and all questions were answered. Patient received an After-Visit Summary.    Orders Placed This Encounter  Procedures   MR Cervical Spine Wo Contrast   Flu vaccine trivalent PF, 6mos and older(Flulaval,Afluria,Fluarix,Fluzone)   Basic Metabolic Panel (BMET)   Meds ordered this encounter  Medications   methylPREDNISolone  (MEDROL  DOSEPAK) 4 MG TBPK tablet    Sig: Instructions per dose pak    Dispense:  21 each    Refill:  0   methocarbamol  (ROBAXIN ) 500 MG tablet    Sig: Take 1 tablet (500 mg total) by mouth every 8 (eight) hours as needed for muscle spasms.    Dispense:  90 tablet    Refill:  0   Referral Orders  No referral(s) requested today     Note is dictated utilizing voice recognition software. Although note has been proof read prior to signing, occasional typographical errors still can be missed. If any questions arise, please do not hesitate to call for verification.   electronically signed by:  Charlies Bellini, DO  [facility] - [place]       [1] No Known Allergies  "

## 2024-04-26 ENCOUNTER — Ambulatory Visit: Payer: Self-pay | Admitting: Family Medicine

## 2024-04-29 ENCOUNTER — Encounter: Payer: Self-pay | Admitting: Family Medicine

## 2024-05-04 ENCOUNTER — Ambulatory Visit
Admission: RE | Admit: 2024-05-04 | Discharge: 2024-05-04 | Disposition: A | Discharge status: Home / Self Care | Source: Ambulatory Visit | Attending: Family Medicine | Admitting: Family Medicine

## 2024-05-04 DIAGNOSIS — M47812 Spondylosis without myelopathy or radiculopathy, cervical region: Secondary | ICD-10-CM

## 2024-05-04 DIAGNOSIS — M542 Cervicalgia: Secondary | ICD-10-CM

## 2024-05-18 ENCOUNTER — Encounter: Payer: Self-pay | Admitting: Family Medicine

## 2024-05-18 ENCOUNTER — Ambulatory Visit: Admitting: Family Medicine

## 2024-05-18 VITALS — BP 126/76 | HR 63 | Temp 98.1°F | Wt 234.0 lb

## 2024-05-18 DIAGNOSIS — M47812 Spondylosis without myelopathy or radiculopathy, cervical region: Secondary | ICD-10-CM | POA: Diagnosis not present

## 2024-05-18 DIAGNOSIS — M542 Cervicalgia: Secondary | ICD-10-CM

## 2024-05-18 DIAGNOSIS — M5000 Cervical disc disorder with myelopathy, unspecified cervical region: Secondary | ICD-10-CM | POA: Diagnosis not present

## 2024-05-18 DIAGNOSIS — M50121 Cervical disc disorder at C4-C5 level with radiculopathy: Secondary | ICD-10-CM | POA: Diagnosis not present

## 2024-05-18 DIAGNOSIS — I10 Essential (primary) hypertension: Secondary | ICD-10-CM | POA: Diagnosis not present

## 2024-05-18 DIAGNOSIS — M4802 Spinal stenosis, cervical region: Secondary | ICD-10-CM

## 2024-05-18 DIAGNOSIS — M50122 Cervical disc disorder at C5-C6 level with radiculopathy: Secondary | ICD-10-CM

## 2024-05-18 MED ORDER — PREGABALIN 150 MG PO CAPS: 150.0000 mg | ORAL_CAPSULE | Freq: Two times a day (BID) | ORAL | 5 refills | Status: AC

## 2024-05-18 MED ORDER — AMLODIPINE BESYLATE 10 MG PO TABS: 10.0000 mg | ORAL_TABLET | Freq: Every day | ORAL | 1 refills | Status: AC

## 2024-05-18 NOTE — Patient Instructions (Signed)

## 2024-05-18 NOTE — Progress Notes (Signed)
 "      Todd Mercado , 1960-12-09, 64 y.o., male MRN: [id number] Patient Care Team    Relationship Specialty Notifications Start End  Catherine Charlies LABOR, DO PCP - General Family Medicine  07/02/15   Shari Sieving, MD Consulting Physician Orthopedic Surgery  09/04/17   Geronimo Amel, MD Consulting Physician Pulmonary Disease  11/02/23     Chief Complaint  Patient presents with   Neck Pain    3 week f/u.      Subjective: Todd Mercado is a 64 y.o. Pt presents for an OV to follow-up on neck pain, initial office visit 04/25/2024.  Patient treated with Medrol  Dosepak, Robaxin , Mobic .  MRI obtained and results reviewed today. Patient reports today his pain and numbness sensation is the same to worse. Best in the morning, worse as the day goes on .  MRI without contrast cervical spine 05/04/2024 COMPARISON:  Plain film cervical spine 01/02/2023. FINDINGS: Alignment: There is 0.2 cm retrolisthesis C3 on C4 and 0.3 cm retrolisthesis C5 on C6. Vertebrae: No fracture, evidence of discitis, or bone lesion. Cord: Normal signal throughout. Posterior Fossa, vertebral arteries, paraspinal tissues: Negative. Disc levels: C2-3: Mild facet arthropathy. There is some uncovertebral spurring on the left causing mild left foraminal narrowing. The central canal and right foramen are open. C3-4: Moderate to moderately severe facet arthritis is worse on the left. Posterior bony ridging and uncovertebral spurring cause mild right and moderate left foraminal narrowing. The central canal is patent. C4-5: There is a disc osteophyte complex left worse than right moderate facet arthropathy and uncovertebral spurring. Mild spinal stenosis is present and there is moderately severe to severe foraminal narrowing bilaterally, worse on the right. C5-6: Disc osteophyte complex, uncovertebral spurring and moderate facet arthritis. There is moderate spinal stenosis with flattening of the ventral cord and  moderately severe bilateral foraminal narrowing. C6-7: There is a right paracentral disc protrusion and a shallow disc bulge. The ventral thecal sac is effaced on the right. Mild bilateral foraminal narrowing is seen. C7-T1: Negative.   IMPRESSION: 1. Cervical spondylosis appears worst at C5-6 where there is moderate spinal stenosis and moderately severe bilateral foraminal narrowing. 2. Mild spinal stenosis and moderately severe to severe foraminal narrowing bilaterally at C4-5. 3. Mild right and moderate left foraminal narrowing C3-4. 4. Right paracentral disc protrusion at C6-7 effaces the ventral  Prior note: with complaints of right arm and right neck pain of 3 months duration.  Associated symptoms include radiation of pain down right arm. Pt has tried over-the-counter NSAIDs to ease their symptoms.  We had been evaluated last year of her shoulder complaint through sports medicine.  He states it is worsening over the last 3 months and causing him daily discomfort with numbness and tingling in his fingertips.    01/02/2023 EXAM: CERVICAL SPINE - COMPLETE 4+ VIEW FINDINGS: The cervical spine is visualized from C1-C6. Trace retrolisthesis with uncovertebral hypertrophy at C4-5 and C5-6. Vertebral body heights are maintained: no evidence of acute fracture. Moderate intervertebral disc space height loss at C4-5 and C5-6. Multilevel osseous neuroforaminal narrowing in the mid cervical spine secondary to facet arthropathy and uncovertebral hypertrophy. This is most pronounced at bilateral C4-5 and C5-6. No prevertebral soft tissue swelling. Visualized thorax is unremarkable.   IMPRESSION: Multilevel degenerative changes of the cervical spine most pronounced at C4-5 and C5-6.     05/18/2024    7:43 AM 11/02/2023   11:15 AM 11/02/2023   11:10 AM 11/14/2022    2:05  PM 05/14/2022    3:33 PM  Depression screen PHQ 2/9  Decreased Interest 0 0 0 2 0  Down, Depressed, Hopeless 0 0 0 0 0   PHQ - 2 Score 0 0 0 2 0  Altered sleeping 0   1   Tired, decreased energy 0   2   Change in appetite 0      Feeling bad or failure about yourself  0   0   Trouble concentrating 0   3   Moving slowly or fidgety/restless 0   0   Suicidal thoughts 0   0   PHQ-9 Score 0   8    Difficult doing work/chores Not difficult at all   Not difficult at all      Data saved with a previous flowsheet row definition    Allergies[1] Social History   Social History Narrative   Todd Mercado is divorced. He has 3 children (Dalton, Camie and [place]ana)   HS education, employed as a media planner, operates heavy machinery   Heavy tobacco use > 45 pack year history, started smoking at age 30.    Alcohol use. No drug use.    Wears his seatbelt, smoke detector in the home.   wears dentures   Feels safe in relationships.    Past Medical History:  Diagnosis Date   Allergy    Anxiety 06/21/2018   Arthritis    Cigar smoker motivated to quit    Community acquired pneumonia of right lung 06/21/2020   COPD (chronic obstructive pulmonary disease) (HCC)    Current every day smoker 06/02/2019   History of ETOH abuse 03/11/2013   H/o drinking 6 beers daily. Now drinks a few on weekends-may drink 6.     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   Wears dentures    Past Surgical History:  Procedure Laterality Date   DENTAL SURGERY     TOTAL KNEE ARTHROPLASTY Right 03/07/2016   Procedure: TOTAL KNEE ARTHROPLASTY;  Surgeon: Toribio Silos, MD;  Location[facility] OR;  Service: Orthopedics;  Laterality: Right;   Family History  Problem Relation Age of Onset   Hypertension Mother    COPD Mother    Arthritis Mother    Heart disease Mother        atrial fibrillation   Atrial fibrillation Mother    Emphysema Father    Early death Father    Arthritis Maternal  Aunt    Diabetes Maternal Aunt    COPD Maternal Uncle    Heart failure Maternal Grandfather    Allergies as of 05/18/2024   No Known Allergies      Medication List        Accurate as of May 18, 2024  8:11 AM. If you have any questions, ask your nurse or doctor.          STOP taking these medications    methylPREDNISolone  4 MG Tbpk tablet Commonly known as: MEDROL  DOSEPAK Stopped by: Charlies Bellini, DO       TAKE these medications    AirDuo Digihaler  113-14 MCG/ACT Aepb Generic drug: Fluticasone -Salmeterol(sensor) Inhale 2 Inhalations into the lungs 2 (two) times daily.   albuterol  108 (90 Base) MCG/ACT inhaler Commonly known as: VENTOLIN  HFA Inhale 1-2 puffs into the lungs every 6 (six) hours as needed for wheezing  or shortness of breath.   amLODipine  10 MG tablet Commonly known as: NORVASC  Take 1 tablet (10 mg total) by mouth daily.   meloxicam  15 MG tablet Commonly known as: MOBIC  NEED AN APPOINTMENT WITH [facility] FOR FURTHER REFILLS.TAKE ONCE EVERY 24 HOURS AS NEEDED FOR PAIN   methocarbamol  500 MG tablet Commonly known as: ROBAXIN  Take 1 tablet (500 mg total) by mouth every 8 (eight) hours as needed for muscle spasms.   pregabalin  150 MG capsule Commonly known as: Lyrica  Take 1 capsule (150 mg total) by mouth 2 (two) times daily. Started by: Charlies Bellini, DO   sildenafil  50 MG tablet Commonly known as: Viagra  Take 1 tablet (50 mg total) by mouth daily as needed for erectile dysfunction.   Spiriva  Respimat 2.5 MCG/ACT Aers Generic drug: Tiotropium Bromide INHALE 2 PUFFS BY MOUTH INTO THE LUNGS DAILY        All past medical history, surgical history, allergies, family history, immunizations andmedications were updated in the EMR today and reviewed under the history and medication portions of their EMR.     Review of Systems  Constitutional: Negative.   HENT: Negative.    Eyes: Negative.   Respiratory: Negative.    Cardiovascular:  Negative.   Gastrointestinal: Negative.   Genitourinary: Negative.   Musculoskeletal:  Positive for neck pain.  Skin: Negative.   Neurological:  Positive for tingling.  Endo/Heme/Allergies: Negative.   Psychiatric/Behavioral: Negative.    All other systems reviewed and are negative.  Negative, with the exception of above mentioned in HPI   Objective:  BP 126/76   Pulse 63   Temp 98.1 F (36.7 C)   Wt 234 lb (106.1 kg)   SpO2 95%   BMI 30.04 kg/m  Body mass index is 30.04 kg/m. Physical Exam Vitals and nursing note reviewed. Exam conducted with a chaperone present.  Constitutional:      General: He is not in acute distress.    Appearance: Normal appearance. He is not ill-appearing, toxic-appearing or diaphoretic.  HENT:     Head: Normocephalic and atraumatic.  Eyes:     General: No scleral icterus.       Right eye: No discharge.        Left eye: No discharge.     Extraocular Movements: Extraocular movements intact.     Pupils: Pupils are equal, round, and reactive to light.  Cardiovascular:     Rate and Rhythm: Normal rate and regular rhythm.  Pulmonary:     Effort: Pulmonary effort is normal.     Breath sounds: Normal breath sounds.  Musculoskeletal:     Cervical back: Spasms, tenderness and crepitus present. No swelling, erythema, rigidity, torticollis or bony tenderness. Pain with movement present. Decreased range of motion.     Right lower leg: No edema.     Left lower leg: No edema.     Comments: Cervical spine: Decreased range of motion.  Tenderness and ropiness right paraspinal muscles.  Numbness and tingling fingertips of right hand.  Vascularly intact distally  Skin:    General: Skin is warm and dry.     Coloration: Skin is not jaundiced or pale.     Findings: No rash.  Neurological:     Mental Status: He is alert and oriented to person, place, and time. Mental status is at baseline.  Psychiatric:        Mood and Affect: Mood normal.        Behavior:  Behavior normal.  Thought Content: Thought content normal.        Judgment: Judgment normal.      No results found. No results found. No results found for this or any previous visit (from the past 24 hours).  Assessment/Plan: Todd Mercado is a 64 y.o. 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  Pain is now radiating down bicep and into fingers causing numbness and tingling in his fingers.   MRI with positive findings of cervical spinal stenosis, foraminal stenosis and disc bulging Pt had tried gabapentin  in the past and it was not helpful for him at the dose he was able to take and still work without sedation.  -Pain:start Lyrica  150 BID Discussed neurosurgery referral and placed referral today.   Hypertension: Patient reports compliance with amlodipine  10 mg daily, BP borderline last visit possibly secondary to pain.  Blood pressure today at goal. Continue amlodipine  10 mg qd  Reviewed expectations re: course of current medical issues. Discussed self-management of symptoms. Outlined signs and symptoms indicating need for more acute intervention. Patient verbalized understanding and all questions were answered. Patient received an After-Visit Summary.    Orders Placed This Encounter  Procedures   Ambulatory referral to Neurosurgery   Meds ordered this encounter  Medications   amLODipine  (NORVASC ) 10 MG tablet    Sig: Take 1 tablet (10 mg total) by mouth daily.    Dispense:  90 tablet    Refill:  1   pregabalin  (LYRICA ) 150 MG capsule    Sig: Take 1 capsule (150 mg total) by mouth 2 (two) times daily.    Dispense:  60 capsule    Refill:  5   Referral Orders         Ambulatory referral to Neurosurgery       Note is dictated utilizing voice recognition  software. Although note has been proof read prior to signing, occasional typographical errors still can be missed. If any questions arise, please do not hesitate to call for verification.   electronically signed by:  Charlies Bellini, DO  [facility] - [place]        [1] No Known Allergies  "

## 2024-06-03 ENCOUNTER — Other Ambulatory Visit: Payer: Self-pay | Admitting: Nurse Practitioner

## 2024-06-22 ENCOUNTER — Ambulatory Visit: Admitting: Orthopedic Surgery
